# Patient Record
Sex: Female | Born: 1985 | Race: Black or African American | Hispanic: No | Marital: Married | Smoking: Never smoker
Health system: Southern US, Community
[De-identification: ages and names within clinical notes are randomized; demographics above are authoritative.]

## PROBLEM LIST (undated history)

## (undated) DIAGNOSIS — M545 Low back pain, unspecified: Secondary | ICD-10-CM

## (undated) DIAGNOSIS — F32A Depression, unspecified: Secondary | ICD-10-CM

## (undated) DIAGNOSIS — D509 Iron deficiency anemia, unspecified: Secondary | ICD-10-CM

## (undated) DIAGNOSIS — N920 Excessive and frequent menstruation with regular cycle: Secondary | ICD-10-CM

## (undated) DIAGNOSIS — I10 Essential (primary) hypertension: Secondary | ICD-10-CM

## (undated) DIAGNOSIS — F329 Major depressive disorder, single episode, unspecified: Secondary | ICD-10-CM

## (undated) DIAGNOSIS — R109 Unspecified abdominal pain: Secondary | ICD-10-CM

## (undated) DIAGNOSIS — B009 Herpesviral infection, unspecified: Secondary | ICD-10-CM

## (undated) HISTORY — DX: Essential (primary) hypertension: I10

## (undated) HISTORY — DX: Major depressive disorder, single episode, unspecified: F32.9

## (undated) HISTORY — PX: ROUX-EN-Y GASTRIC BYPASS: SHX1104

## (undated) HISTORY — PX: WISDOM TOOTH EXTRACTION: SHX21

## (undated) HISTORY — PX: DILATION AND CURETTAGE OF UTERUS: SHX78

## (undated) HISTORY — DX: Herpesviral infection, unspecified: B00.9

## (undated) HISTORY — DX: Depression, unspecified: F32.A

---
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
## 2008-08-04 DIAGNOSIS — E559 Vitamin D deficiency, unspecified: Secondary | ICD-10-CM | POA: Insufficient documentation

## 2011-11-08 DIAGNOSIS — Z6791 Unspecified blood type, Rh negative: Secondary | ICD-10-CM | POA: Insufficient documentation

## 2012-05-15 DIAGNOSIS — O10019 Pre-existing essential hypertension complicating pregnancy, unspecified trimester: Secondary | ICD-10-CM | POA: Insufficient documentation

## 2012-07-29 ENCOUNTER — Other Ambulatory Visit (HOSPITAL_COMMUNITY): Payer: Self-pay | Admitting: Obstetrics and Gynecology

## 2012-07-29 DIAGNOSIS — O169 Unspecified maternal hypertension, unspecified trimester: Secondary | ICD-10-CM

## 2012-09-02 ENCOUNTER — Encounter (HOSPITAL_COMMUNITY): Payer: Self-pay

## 2012-09-02 ENCOUNTER — Ambulatory Visit (HOSPITAL_COMMUNITY)
Admission: RE | Admit: 2012-09-02 | Discharge: 2012-09-02 | Disposition: A | Discharge status: Home / Self Care | Payer: Medicaid Other | Source: Ambulatory Visit | Attending: Obstetrics and Gynecology | Admitting: Obstetrics and Gynecology

## 2012-09-02 DIAGNOSIS — O3410 Maternal care for benign tumor of corpus uteri, unspecified trimester: Secondary | ICD-10-CM | POA: Insufficient documentation

## 2012-09-02 DIAGNOSIS — O169 Unspecified maternal hypertension, unspecified trimester: Secondary | ICD-10-CM

## 2012-09-02 DIAGNOSIS — O10019 Pre-existing essential hypertension complicating pregnancy, unspecified trimester: Secondary | ICD-10-CM | POA: Insufficient documentation

## 2012-09-29 ENCOUNTER — Ambulatory Visit (HOSPITAL_COMMUNITY)
Admission: RE | Admit: 2012-09-29 | Discharge: 2012-09-29 | Disposition: A | Discharge status: Home / Self Care | Payer: Self-pay | Source: Ambulatory Visit | Attending: Obstetrics and Gynecology | Admitting: Obstetrics and Gynecology

## 2012-09-29 VITALS — BP 135/83 | HR 86 | Wt 188.8 lb

## 2012-09-29 DIAGNOSIS — O10019 Pre-existing essential hypertension complicating pregnancy, unspecified trimester: Secondary | ICD-10-CM | POA: Insufficient documentation

## 2012-09-29 DIAGNOSIS — Z3689 Encounter for other specified antenatal screening: Secondary | ICD-10-CM | POA: Insufficient documentation

## 2012-09-29 DIAGNOSIS — O3410 Maternal care for benign tumor of corpus uteri, unspecified trimester: Secondary | ICD-10-CM | POA: Insufficient documentation

## 2012-09-29 DIAGNOSIS — O169 Unspecified maternal hypertension, unspecified trimester: Secondary | ICD-10-CM

## 2012-09-29 NOTE — Progress Notes (Signed)
Jenna Sims  was seen today for an ultrasound appointment.  See full report in [facility].  Impression: Single IUP at 29 2/7 weeks Follow up growth due to chronic hypertension on Labetolol Normal interval anatomy Fetal growth is appropriate (66th %tile) Normal amniotic fluid volume  Recommendations: Recommend follow-up ultrasound examination in 4 weeks for interval growth. Recommend antepartum fetal testing beginning at 32 weeks due to chronic hypertension.  Alpha Gula, MD

## 2012-10-27 ENCOUNTER — Encounter (HOSPITAL_COMMUNITY): Payer: Self-pay

## 2012-10-27 ENCOUNTER — Ambulatory Visit (HOSPITAL_COMMUNITY)
Admission: RE | Admit: 2012-10-27 | Discharge: 2012-10-27 | Disposition: A | Discharge status: Home / Self Care | Payer: BC Managed Care – PPO | Source: Ambulatory Visit | Attending: Obstetrics and Gynecology | Admitting: Obstetrics and Gynecology

## 2012-10-27 ENCOUNTER — Other Ambulatory Visit (HOSPITAL_COMMUNITY): Payer: Self-pay | Admitting: Maternal and Fetal Medicine

## 2012-10-27 DIAGNOSIS — O169 Unspecified maternal hypertension, unspecified trimester: Secondary | ICD-10-CM

## 2012-10-27 DIAGNOSIS — O10019 Pre-existing essential hypertension complicating pregnancy, unspecified trimester: Secondary | ICD-10-CM | POA: Insufficient documentation

## 2012-10-27 DIAGNOSIS — O3410 Maternal care for benign tumor of corpus uteri, unspecified trimester: Secondary | ICD-10-CM | POA: Insufficient documentation

## 2012-10-27 DIAGNOSIS — O360990 Maternal care for other rhesus isoimmunization, unspecified trimester, not applicable or unspecified: Secondary | ICD-10-CM | POA: Insufficient documentation

## 2012-11-04 ENCOUNTER — Ambulatory Visit (HOSPITAL_COMMUNITY)
Admission: RE | Admit: 2012-11-04 | Discharge: 2012-11-04 | Disposition: A | Discharge status: Home / Self Care | Payer: BC Managed Care – PPO | Source: Ambulatory Visit | Attending: Specialist | Admitting: Specialist

## 2012-11-04 ENCOUNTER — Other Ambulatory Visit (HOSPITAL_COMMUNITY): Payer: Self-pay | Admitting: Specialist

## 2012-11-04 DIAGNOSIS — O169 Unspecified maternal hypertension, unspecified trimester: Secondary | ICD-10-CM

## 2012-11-04 DIAGNOSIS — O10019 Pre-existing essential hypertension complicating pregnancy, unspecified trimester: Secondary | ICD-10-CM | POA: Insufficient documentation

## 2012-11-04 DIAGNOSIS — O3410 Maternal care for benign tumor of corpus uteri, unspecified trimester: Secondary | ICD-10-CM | POA: Insufficient documentation

## 2012-11-04 NOTE — ED Notes (Signed)
Spoke with Dr. Sherrie George about pt working night shift and having multiple appointments between [facility] and [facility].  Ok for pt to have weekly BPP with NST per Dr. Sherrie George.

## 2012-11-07 ENCOUNTER — Ambulatory Visit (HOSPITAL_COMMUNITY): Admission: RE | Admit: 2012-11-07 | Payer: BC Managed Care – PPO | Source: Ambulatory Visit

## 2012-11-07 ENCOUNTER — Ambulatory Visit (HOSPITAL_COMMUNITY)
Admission: RE | Admit: 2012-11-07 | Discharge: 2012-11-07 | Disposition: A | Discharge status: Home / Self Care | Payer: BC Managed Care – PPO | Source: Ambulatory Visit | Attending: Specialist | Admitting: Specialist

## 2012-11-07 ENCOUNTER — Other Ambulatory Visit (HOSPITAL_COMMUNITY): Payer: Self-pay | Admitting: Specialist

## 2012-11-07 DIAGNOSIS — O10019 Pre-existing essential hypertension complicating pregnancy, unspecified trimester: Secondary | ICD-10-CM | POA: Insufficient documentation

## 2012-11-07 DIAGNOSIS — O169 Unspecified maternal hypertension, unspecified trimester: Secondary | ICD-10-CM

## 2012-11-07 DIAGNOSIS — O3410 Maternal care for benign tumor of corpus uteri, unspecified trimester: Secondary | ICD-10-CM | POA: Insufficient documentation

## 2012-11-07 NOTE — Progress Notes (Signed)
Jenna Sims  was seen today for an ultrasound appointment.  See full report in [facility].  Impression: Single IUP at 34 6/7 weeks CHTN on Labetalol Active fetus- BPP 8/8 Normal amniotic fluid index  Recommendations: Continue twice weekly NSTs with weekly AFIs   Deliver by 39 weeks  Alpha Gula, MD

## 2012-11-11 ENCOUNTER — Ambulatory Visit (HOSPITAL_COMMUNITY): Payer: BC Managed Care – PPO

## 2012-11-13 ENCOUNTER — Other Ambulatory Visit (HOSPITAL_COMMUNITY): Payer: Self-pay | Admitting: Specialist

## 2012-11-13 DIAGNOSIS — O169 Unspecified maternal hypertension, unspecified trimester: Secondary | ICD-10-CM

## 2012-11-14 ENCOUNTER — Ambulatory Visit (HOSPITAL_COMMUNITY): Payer: BC Managed Care – PPO

## 2012-11-14 ENCOUNTER — Ambulatory Visit (HOSPITAL_COMMUNITY)
Admission: RE | Admit: 2012-11-14 | Discharge: 2012-11-14 | Disposition: A | Discharge status: Home / Self Care | Payer: BC Managed Care – PPO | Source: Ambulatory Visit | Attending: Specialist | Admitting: Specialist

## 2012-11-14 DIAGNOSIS — O169 Unspecified maternal hypertension, unspecified trimester: Secondary | ICD-10-CM

## 2012-11-14 DIAGNOSIS — O10019 Pre-existing essential hypertension complicating pregnancy, unspecified trimester: Secondary | ICD-10-CM | POA: Insufficient documentation

## 2012-11-14 DIAGNOSIS — O3410 Maternal care for benign tumor of corpus uteri, unspecified trimester: Secondary | ICD-10-CM | POA: Insufficient documentation

## 2012-11-18 ENCOUNTER — Ambulatory Visit (HOSPITAL_COMMUNITY): Payer: BC Managed Care – PPO

## 2012-11-20 ENCOUNTER — Other Ambulatory Visit (HOSPITAL_COMMUNITY): Payer: Self-pay | Admitting: Maternal and Fetal Medicine

## 2012-11-20 DIAGNOSIS — O163 Unspecified maternal hypertension, third trimester: Secondary | ICD-10-CM

## 2012-11-21 ENCOUNTER — Ambulatory Visit (HOSPITAL_COMMUNITY): Payer: BC Managed Care – PPO

## 2012-11-21 ENCOUNTER — Ambulatory Visit (HOSPITAL_COMMUNITY)
Admission: RE | Admit: 2012-11-21 | Discharge: 2012-11-21 | Disposition: A | Discharge status: Home / Self Care | Payer: BC Managed Care – PPO | Source: Ambulatory Visit | Attending: Maternal and Fetal Medicine | Admitting: Maternal and Fetal Medicine

## 2012-11-21 DIAGNOSIS — O3410 Maternal care for benign tumor of corpus uteri, unspecified trimester: Secondary | ICD-10-CM | POA: Insufficient documentation

## 2012-11-21 DIAGNOSIS — O163 Unspecified maternal hypertension, third trimester: Secondary | ICD-10-CM

## 2012-11-21 DIAGNOSIS — O10019 Pre-existing essential hypertension complicating pregnancy, unspecified trimester: Secondary | ICD-10-CM | POA: Insufficient documentation

## 2012-11-25 ENCOUNTER — Ambulatory Visit (HOSPITAL_COMMUNITY): Payer: Medicaid Other

## 2012-11-25 ENCOUNTER — Other Ambulatory Visit (HOSPITAL_COMMUNITY): Payer: Self-pay | Admitting: Specialist

## 2012-11-25 DIAGNOSIS — O10019 Pre-existing essential hypertension complicating pregnancy, unspecified trimester: Secondary | ICD-10-CM

## 2012-11-27 ENCOUNTER — Ambulatory Visit (HOSPITAL_COMMUNITY)
Admission: RE | Admit: 2012-11-27 | Discharge: 2012-11-27 | Disposition: A | Discharge status: Home / Self Care | Payer: BC Managed Care – PPO | Source: Ambulatory Visit | Attending: Specialist | Admitting: Specialist

## 2012-11-27 DIAGNOSIS — O10019 Pre-existing essential hypertension complicating pregnancy, unspecified trimester: Secondary | ICD-10-CM | POA: Insufficient documentation

## 2012-11-27 DIAGNOSIS — O3410 Maternal care for benign tumor of corpus uteri, unspecified trimester: Secondary | ICD-10-CM | POA: Insufficient documentation

## 2012-12-02 ENCOUNTER — Other Ambulatory Visit (HOSPITAL_COMMUNITY): Payer: Self-pay | Admitting: Specialist

## 2012-12-02 DIAGNOSIS — O10013 Pre-existing essential hypertension complicating pregnancy, third trimester: Secondary | ICD-10-CM

## 2012-12-05 ENCOUNTER — Ambulatory Visit (HOSPITAL_COMMUNITY): Admission: RE | Admit: 2012-12-05 | Payer: BC Managed Care – PPO | Source: Ambulatory Visit

## 2012-12-08 DIAGNOSIS — O139 Gestational [pregnancy-induced] hypertension without significant proteinuria, unspecified trimester: Secondary | ICD-10-CM

## 2013-07-08 ENCOUNTER — Encounter (HOSPITAL_COMMUNITY): Payer: Self-pay | Admitting: *Deleted

## 2014-02-08 ENCOUNTER — Encounter (HOSPITAL_COMMUNITY): Payer: Self-pay | Admitting: *Deleted

## 2016-11-09 DIAGNOSIS — I10 Essential (primary) hypertension: Secondary | ICD-10-CM | POA: Insufficient documentation

## 2017-05-14 ENCOUNTER — Ambulatory Visit
Admission: RE | Admit: 2017-05-14 | Discharge: 2017-05-14 | Disposition: A | Discharge status: Home / Self Care | Payer: Federal, State, Local not specified - PPO | Source: Ambulatory Visit | Attending: Medical Oncology | Admitting: Medical Oncology

## 2017-05-14 ENCOUNTER — Other Ambulatory Visit: Payer: Self-pay | Admitting: Medical Oncology

## 2017-05-14 DIAGNOSIS — R05 Cough: Secondary | ICD-10-CM

## 2017-05-14 DIAGNOSIS — R053 Chronic cough: Secondary | ICD-10-CM

## 2018-01-08 ENCOUNTER — Encounter: Payer: Self-pay | Admitting: Obstetrics and Gynecology

## 2018-01-08 ENCOUNTER — Ambulatory Visit (INDEPENDENT_AMBULATORY_CARE_PROVIDER_SITE_OTHER): Payer: Federal, State, Local not specified - PPO | Admitting: Obstetrics and Gynecology

## 2018-01-08 ENCOUNTER — Other Ambulatory Visit (HOSPITAL_COMMUNITY)
Admission: RE | Admit: 2018-01-08 | Discharge: 2018-01-08 | Disposition: A | Discharge status: Home / Self Care | Payer: Federal, State, Local not specified - PPO | Source: Ambulatory Visit | Attending: Obstetrics and Gynecology | Admitting: Obstetrics and Gynecology

## 2018-01-08 VITALS — BP 124/74 | Ht 62.0 in | Wt 212.0 lb

## 2018-01-08 DIAGNOSIS — Z1339 Encounter for screening examination for other mental health and behavioral disorders: Secondary | ICD-10-CM | POA: Diagnosis not present

## 2018-01-08 DIAGNOSIS — Z01419 Encounter for gynecological examination (general) (routine) without abnormal findings: Secondary | ICD-10-CM

## 2018-01-08 DIAGNOSIS — N921 Excessive and frequent menstruation with irregular cycle: Secondary | ICD-10-CM | POA: Insufficient documentation

## 2018-01-08 DIAGNOSIS — Z1331 Encounter for screening for depression: Secondary | ICD-10-CM

## 2018-01-08 DIAGNOSIS — Z113 Encounter for screening for infections with a predominantly sexual mode of transmission: Secondary | ICD-10-CM

## 2018-01-08 DIAGNOSIS — Z124 Encounter for screening for malignant neoplasm of cervix: Secondary | ICD-10-CM

## 2018-01-08 LAB — HM PAP SMEAR: HM Pap smear: NORMAL

## 2018-01-08 NOTE — Progress Notes (Signed)
Gynecology Annual Exam  PCP: Clent Jacks, PA-C  Chief Complaint  Patient presents with  . Annual Exam    History of Present Illness:  [name] is a 32 y.o. [id number] who LMP was Patient's last menstrual period was 01/06/2018., presents today for her annual examination.  Her menses come twice monthly.  Sometimes the interval is shorter.  Her menses lasts 4-5 days when present. They are heavy with passage of clots, especially with the first two days. She does have dysmenorrhea.  The cramping bothers her the most.   She is single partner, contraception - none. "Unofficially" trying to get pregnant. She stopped using Xulane about 1 year ago.  Last Pap:  3+ years, normal.  History of abnormal 8-9 years ago.  "Nothing concerning." Hx of STDs: HSV  Last mammogram:  n/a There is no FH of breast cancer. There is no FH of ovarian cancer. The patient does do self-breast exams.  Tobacco use: The patient denies current or previous tobacco use. Alcohol use: social drinker Exercise: does a lot of walking.   The patient wears seatbelts: yes.     She states that she feels safe at home.   She is being treated by Clent Jacks, NP at [facility] in [place].  She recently started taking Prozac.  She scored a "2" on number 9 on the PHQ-9.  She has fleeting thoughts that she would prefer not to be here. She has never formed a plan.  She is able to contract for safety.    Past Medical History:  Diagnosis Date  . Depression   . HSV-1 infection   . Hypertension     Past Surgical History:  Procedure Laterality Date  . DILATION AND CURETTAGE OF UTERUS    . WISDOM TOOTH EXTRACTION      Prior to Admission medications   Medication Sig Start Date End Date Taking? Authorizing Provider  FLUoxetine (PROZAC) 10 MG capsule Take by mouth. 12/26/17 12/26/18 Yes [provider]  hydrochlorothiazide (HYDRODIURIL) 12.5 MG tablet Take by mouth. 02/15/17 02/15/18 Yes [provider]  lisinopril (PRINIVIL,ZESTRIL) 20 MG tablet Take by mouth. 01/16/17 01/16/18 Yes [provider]  Prenatal Vit w/Fe-Methylfol-FA (PNV PO) Take by mouth.    [provider]   Allergies: No Known Allergies  Gynecologic History: Patient's last menstrual period was 01/06/2018.  Obstetric History: [id number], s/p SVD (2008, 2014 - HTN issues of pregnancy, but has had high blood pressure since then).    Social History   Socioeconomic History  . Marital status: Single    Spouse name: Not on file  . Number of children: Not on file  . Years of education: Not on file  . Highest education level: Not on file  Occupational History  . Not on file  Social Needs  . Financial resource strain: Not on file  . Food insecurity:    Worry: Not on file    Inability: Not on file  . Transportation needs:    Medical: Not on file    Non-medical: Not on file  Tobacco Use  . Smoking status: Never Smoker  . Smokeless tobacco: Never Used  Substance and Sexual Activity  . Alcohol use: Not Currently  . Drug use: Never  . Sexual activity: Yes    Birth control/protection: None  Lifestyle  . Physical activity:    Days per week: Not on file    Minutes per session: Not on file  . Stress: Not on file  Relationships  . Social connections:    Talks on phone: Not on file    Gets together: Not on file    Attends religious service: Not on file    Active member of club or organization: Not on file    Attends meetings of clubs or organizations: Not on file    Relationship status: Not on file  . Intimate partner violence:    Fear of current or ex partner: Not on file    Emotionally abused: Not on file    Physically abused: Not on file    Forced sexual activity: Not on file  Other Topics Concern  . Not on file  Social History Narrative  . Not on file    Family History  Problem Relation Age of Onset  . Hypertension Mother   . Hypertension Father     Review of Systems   Constitutional: Negative.   HENT: Negative.   Eyes: Negative.   Respiratory: Negative.   Cardiovascular: Negative.   Gastrointestinal: Negative.   Genitourinary: Negative.   Musculoskeletal: Negative.   Skin: Negative.   Neurological: Negative.   Psychiatric/Behavioral: Negative.      Physical Exam BP 124/74   Ht 5\' 2"  (1.575 m)   Wt 212 lb (96.2 kg)   LMP 01/06/2018   BMI 38.78 kg/m    Physical Exam  Constitutional: She is oriented to person, place, and time. She appears well-developed and well-nourished. No distress.  Genitourinary: Uterus normal. Pelvic exam was performed with patient supine. There is no rash, tenderness, lesion or injury on the right labia. There is no rash, tenderness, lesion or injury on the left labia. No erythema, tenderness or bleeding in the vagina. No signs of injury around the vagina. No vaginal discharge found. Right adnexum does not display mass, does not display tenderness and does not display fullness. Left adnexum does not display mass, does not display tenderness and does not display fullness. Cervix does not exhibit motion tenderness, lesion, discharge or polyp.   Uterus is mobile and anteverted. Uterus is not enlarged, tender or exhibiting a mass.  HENT:  Head: Normocephalic and atraumatic.  Eyes: EOM are normal. No scleral icterus.  Neck: Normal range of motion. Neck supple. No thyromegaly present.  Cardiovascular: Normal rate and regular rhythm. Exam reveals no gallop and no friction rub.  No murmur heard. Pulmonary/Chest: Effort normal and breath sounds normal. No respiratory distress. She has no wheezes. She has no rales. Right breast exhibits no inverted nipple, no mass, no nipple discharge, no skin change and no tenderness. Left breast exhibits no inverted nipple, no mass, no nipple discharge, no skin change and no tenderness.  Abdominal: Soft. Bowel sounds are normal. She exhibits no distension and no mass. There is no tenderness. There  is no rebound and no guarding.  Musculoskeletal: Normal range of motion. She exhibits no edema or tenderness.  Lymphadenopathy:    She has no cervical adenopathy.       Right: No inguinal adenopathy present.       Left: No inguinal adenopathy present.  Neurological: She is alert and oriented to person, place, and time. No cranial nerve deficit.  Skin: Skin is warm and dry. No rash noted. No erythema.  Psychiatric: She has a normal mood and affect. Her behavior is normal. Judgment normal.    Female chaperone present for pelvic and breast  portions of the physical exam  Results: AUDIT Questionnaire (screen for alcoholism): 2 PHQ-9: 13  Assessment: 32 y.o. [id number] female  here for routine annual gynecologic examination.  Plan: Problem List Items Addressed This Visit      Other   Menorrhagia with irregular cycle   Relevant Orders   US PELVIS TRANSVANGINAL NON-OB (TV ONLY)    Other Visit Diagnoses    Women's annual routine gynecological examination    -  Primary   Relevant Orders   Cytology - PAP   Screening for depression       Screening for alcoholism       Pap smear for cervical cancer screening       Relevant Orders   Cytology - PAP   Screen for STD (sexually transmitted disease)       Relevant Orders   Cytology - PAP      Screening: -- Blood pressure screen managed by PCP -- Colonoscopy - not due -- Mammogram - not due -- Weight screening: obese: discussed management options, including lifestyle, dietary, and exercise. -- Depression screening positive(PHQ-9): Currently under treatment with PCP.  -- Nutrition: normal -- cholesterol screening: not due for screening -- osteoporosis screening: not due -- tobacco screening: not using -- alcohol screening: AUDIT questionnaire indicates low-risk usage. -- family history of breast cancer screening: done. not at high risk. -- no evidence of domestic violence or intimate partner violence. -- STD screening:  gonorrhea/chlamydia NAAT collected -- pap smear collected per ASCCP guidelines -- flu vaccine plan to get at work -- HPV vaccination series: received  Depression: per PCP.  Will defer treatment. Patient recently started treated. She is able to contract for safety now. Reviewed emergency plan for active SI.   Preconception counseling: Discussed optimizing health for pregnancy, as she is not actively trying but could get pregnant.  Recommended she discuss with her PCP a change in her blood pressure medication to be more consistent with safe medications in pregnancy.  Specifically, I would discontinue the ACE inhibitor and consider discontinuing HCTZ.  Labetalol has been shown to be a safe and effective antihypertensive in pregnancy and would recommend it as a first-line medication.  I also encouraged the use of prenatal vitamins at this time.  Also, we discussed weight loss as a way of optimizing any future pregnancy.  Any of these recommendations would be notified should the patient choose to begin taking contraception.  We did discuss the use of Prozac in pregnancy.  Given her high level of depression, it would be safer for her to be on the medication during her pregnancy then to be off the medication.  Therefore, I recommend she continue taking it.  Menorrhagia: Discussed management options for abnormal uterine bleeding including expectant, NSAIDs, tranexamic acid (Lysteda), oral progesterone (Provera, norethindrone, megace), Depo Provera, Levonorgestrel containing IUD, endometrial ablation (Novasure) or hysterectomy as definitive surgical management.  Discussed risks and benefits of each method.   Final management decision will hinge on results of patient's work up and whether an underlying etiology for the patients bleeding symptoms can be discerned.  We will conduct a basic work up examining using the PALM-COIEN classification system.  In the meantime the patient opts to trial no medication while we  await results of her ultrasound and labs.  Printed patient education handouts were given to the patient to review at home.  Bleeding precautions reviewed.   15 minutes spent in face to face discussion with > 50% spent in counseling,management, and coordination of care of her menorrhagia with irregular cycle.   Thomasene Mohair, MD 01/08/2018 11:50 AM

## 2018-01-09 LAB — CYTOLOGY - PAP
CHLAMYDIA, DNA PROBE: NEGATIVE
DIAGNOSIS: NEGATIVE
NEISSERIA GONORRHEA: NEGATIVE

## 2018-01-17 ENCOUNTER — Encounter: Payer: Self-pay | Admitting: Obstetrics and Gynecology

## 2018-01-17 ENCOUNTER — Ambulatory Visit (INDEPENDENT_AMBULATORY_CARE_PROVIDER_SITE_OTHER): Payer: Federal, State, Local not specified - PPO

## 2018-01-17 ENCOUNTER — Ambulatory Visit (INDEPENDENT_AMBULATORY_CARE_PROVIDER_SITE_OTHER): Payer: Federal, State, Local not specified - PPO | Admitting: Obstetrics and Gynecology

## 2018-01-17 VITALS — BP 124/78 | Ht 62.0 in | Wt 212.0 lb

## 2018-01-17 DIAGNOSIS — N921 Excessive and frequent menstruation with irregular cycle: Secondary | ICD-10-CM

## 2018-01-17 DIAGNOSIS — N83291 Other ovarian cyst, right side: Secondary | ICD-10-CM

## 2018-01-17 DIAGNOSIS — N8003 Adenomyosis of the uterus: Secondary | ICD-10-CM | POA: Insufficient documentation

## 2018-01-17 DIAGNOSIS — N809 Endometriosis, unspecified: Secondary | ICD-10-CM

## 2018-01-17 NOTE — Progress Notes (Signed)
Gynecology Ultrasound Follow Up   Chief Complaint  Patient presents with  . Follow-up  Menorrhagia with irregular cycle  History of Present Illness: Patient is a 32 y.o. female who presents today for ultrasound evaluation of the above .  Ultrasound demonstrates the following findings Adnexa: no masses seen  Uterus: antevereted with endometrial stripe  12.7 mm Additional: echotexture of mymometrium is heterogeneous.  Hyperechoic area in the posterior uterine wall measures 3.4 x 1.9 cm.   Past Medical History:  Diagnosis Date  . Depression   . HSV-1 infection   . Hypertension     Past Surgical History:  Procedure Laterality Date  . DILATION AND CURETTAGE OF UTERUS    . WISDOM TOOTH EXTRACTION       Family History  Problem Relation Age of Onset  . Hypertension Mother   . Hypertension Father     Social History   Socioeconomic History  . Marital status: Married    Spouse name: Not on file  . Number of children: Not on file  . Years of education: Not on file  . Highest education level: Not on file  Occupational History  . Not on file  Social Needs  . Financial resource strain: Not on file  . Food insecurity:    Worry: Not on file    Inability: Not on file  . Transportation needs:    Medical: Not on file    Non-medical: Not on file  Tobacco Use  . Smoking status: Never Smoker  . Smokeless tobacco: Never Used  Substance and Sexual Activity  . Alcohol use: Not Currently  . Drug use: Never  . Sexual activity: Yes    Birth control/protection: None  Lifestyle  . Physical activity:    Days per week: Not on file    Minutes per session: Not on file  . Stress: Not on file  Relationships  . Social connections:    Talks on phone: Not on file    Gets together: Not on file    Attends religious service: Not on file    Active member of club or organization: Not on file    Attends meetings of clubs or organizations: Not on file    Relationship status: Not on file   . Intimate partner violence:    Fear of current or ex partner: Not on file    Emotionally abused: Not on file    Physically abused: Not on file    Forced sexual activity: Not on file  Other Topics Concern  . Not on file  Social History Narrative  . Not on file    No Known Allergies  Prior to Admission medications   Medication Sig Start Date End Date Taking? Authorizing Provider  FLUoxetine (PROZAC) 10 MG capsule Take by mouth. 12/26/17 12/26/18  [provider]  hydrochlorothiazide (HYDRODIURIL) 12.5 MG tablet Take by mouth. 02/15/17 02/15/18  [provider]  lisinopril (PRINIVIL,ZESTRIL) 20 MG tablet Take by mouth. 01/16/17 01/16/18  [provider]  Prenatal Vit w/Fe-Methylfol-FA (PNV PO) Take by mouth.    [provider]    Physical Exam BP 124/78   Ht 5\' 2"  (1.575 m)   Wt 212 lb (96.2 kg)   LMP 01/06/2018   BMI 38.78 kg/m    General: NAD HEENT: normocephalic, anicteric Pulmonary: No increased work of breathing Extremities: no edema, erythema, or tenderness Neurologic: Grossly intact, normal gait Psychiatric: mood appropriate, affect full  Imaging Results:  US Pelvis Transvanginal Non-ob (tv Only)  Result Date: 01/17/2018 Patient Name: BENNETT VANSCYOC DOB: 06/03/1985 MRN: [id number] ULTRASOUND REPORT Location: [facility] Date of Service: 01/17/2018 Indications: Abnormal Uterine Bleeding Findings: The uterus is anteverted and measures 9.6 x 8.1 x 6.5 cm. Echo texture is heterogenous with evidence of focal masses. A hyperechoic area in the posterior uterine wall = 3.4 x 1.9 cm which has two small echo free cysts both = 0.5 x 0.3 cm , ( Possibility of adenomyosis)  The Endometrium measures 12.7 mm. Right Ovary measures 4.3 x 3.3 cm. It is normal in appearance. A single simple cyst is seen  = 19.8 x 18.4 mm (domonant follicle vs Corpus luteum) Left Ovary measures 3.4 x 3.2 x 2.5 cm. It is normal in appearance. Survey of the adnexa  demonstrates no adnexal masses. There is no free fluid in the cul de sac. Impression: 1.Echotexture is heterogenous with evidence of focal masses. A hyperechoic area in the posterior uterine wall = 3.4 x 1.9 cm which has two small echo free cysts both = 0.5 x 0.3 cm , ( Possibility of adenomyosis)  Abeer Alsammarraie,RDMS The ultrasound images and findings were reviewed by me and I agree with the above report. Thomasene Mohair, MD, [facility], [facility] 01/17/2018 11:49 AM       Assessment: 32 y.o. [id number]  1. Menorrhagia with irregular cycle      Plan: Problem List Items Addressed This Visit      Other   Menorrhagia with irregular cycle - Primary     Discussed ultrasound results in detail.  I suspect she has either an adenomyoma or mild adenomyosis.  We discussed this in the setting of attempting pregnancy in her young age.  She continues to want to passively attempt pregnancy.  I again encouraged her to optimize her overall health and optimize her medication regiment for pregnancy, she could get pregnant with any cycle.  Further, we discussed active attempts at pregnancy with utilization of ovulation prediction kits and timing of her cycle.  If her cycle is too irregular, can try to manage the timing of her cycle using scheduled withdrawal bleeds and ovulation testing.  Medications acceptable in pregnancy are labetalol, nifedipine, and methyldopa.  15 minutes spent in face to face discussion with > 50% spent in counseling,management, and coordination of care of her menorrhagia and likely adenomyosis.  Thomasene Mohair, MD, [facility], [facility] 01/17/2018 1:04 PM

## 2018-05-14 ENCOUNTER — Encounter: Payer: Self-pay | Admitting: Obstetrics and Gynecology

## 2018-05-14 ENCOUNTER — Ambulatory Visit: Payer: Federal, State, Local not specified - PPO | Admitting: Obstetrics and Gynecology

## 2018-05-14 VITALS — BP 118/74 | Ht 61.0 in | Wt 215.0 lb

## 2018-05-14 DIAGNOSIS — N8003 Adenomyosis of the uterus: Secondary | ICD-10-CM

## 2018-05-14 DIAGNOSIS — N809 Endometriosis, unspecified: Secondary | ICD-10-CM

## 2018-05-14 DIAGNOSIS — G8929 Other chronic pain: Secondary | ICD-10-CM

## 2018-05-14 DIAGNOSIS — R102 Pelvic and perineal pain: Secondary | ICD-10-CM | POA: Diagnosis not present

## 2018-05-14 NOTE — Progress Notes (Signed)
Obstetrics & Gynecology Office Visit   Chief Complaint  Patient presents with  . Advice Only  . Pelvic Pain    History of Present Illness: 33 y.o. [id number] female who presents to discuss options for checking for endometriosis.  She has been doing some internet reading that, to her, indicates that she may have endometriosis.  She states that while she has her menses she has trouble with passing a bowel movement.  She has pressure in her pelvis and a general pain during her menses.  She describes the pain as intense.  So much so that she had to miss work due to the pain.     Past Medical History:  Diagnosis Date  . Depression   . HSV-1 infection   . Hypertension     Past Surgical History:  Procedure Laterality Date  . DILATION AND CURETTAGE OF UTERUS    . WISDOM TOOTH EXTRACTION      Gynecologic History: Patient's last menstrual period was 04/23/2018.  Obstetric History: [id number]  Family History  Problem Relation Age of Onset  . Hypertension Mother   . Hypertension Father     Social History   Socioeconomic History  . Marital status: Married    Spouse name: Not on file  . Number of children: Not on file  . Years of education: Not on file  . Highest education level: Not on file  Occupational History  . Not on file  Social Needs  . Financial resource strain: Not on file  . Food insecurity:    Worry: Not on file    Inability: Not on file  . Transportation needs:    Medical: Not on file    Non-medical: Not on file  Tobacco Use  . Smoking status: Never Smoker  . Smokeless tobacco: Never Used  Substance and Sexual Activity  . Alcohol use: Not Currently  . Drug use: Never  . Sexual activity: Yes    Birth control/protection: None  Lifestyle  . Physical activity:    Days per week: Not on file    Minutes per session: Not on file  . Stress: Not on file  Relationships  . Social connections:    Talks on phone: Not on file    Gets together: Not on file    Attends  religious service: Not on file    Active member of club or organization: Not on file    Attends meetings of clubs or organizations: Not on file    Relationship status: Not on file  . Intimate partner violence:    Fear of current or ex partner: Not on file    Emotionally abused: Not on file    Physically abused: Not on file    Forced sexual activity: Not on file  Other Topics Concern  . Not on file  Social History Narrative  . Not on file    No Known Allergies  Prior to Admission medications   Medication Sig Start Date End Date Taking? Authorizing Provider  FLUoxetine (PROZAC) 10 MG capsule Take by mouth. 12/26/17 12/26/18  [provider]  hydrochlorothiazide (HYDRODIURIL) 12.5 MG tablet Take by mouth. 02/15/17 02/15/18  [provider]  lisinopril (PRINIVIL,ZESTRIL) 20 MG tablet Take by mouth. 01/16/17 01/16/18  [provider]  Prenatal Vit w/Fe-Methylfol-FA (PNV PO) Take by mouth.    [provider]    Review of Systems  Constitutional: Negative.   HENT: Negative.   Eyes: Negative.   Respiratory: Negative.   Cardiovascular: Negative.  Gastrointestinal: Negative.   Genitourinary: Negative.   Musculoskeletal: Negative.   Skin: Negative.   Neurological: Negative.   Psychiatric/Behavioral: Negative.      Physical Exam BP 118/74   Ht 5\' 1"  (1.549 m)   Wt 215 lb (97.5 kg)   LMP 04/23/2018   BMI 40.62 kg/m  Patient's last menstrual period was 04/23/2018. Physical Exam Constitutional:      General: She is not in acute distress.    Appearance: Normal appearance.  HENT:     Head: Normocephalic and atraumatic.  Eyes:     General: No scleral icterus.    Conjunctiva/sclera: Conjunctivae normal.  Neurological:     General: No focal deficit present.     Mental Status: She is alert and oriented to person, place, and time.     Cranial Nerves: No cranial nerve deficit.  Psychiatric:        Mood and Affect: Mood normal.        Behavior:  Behavior normal.        Judgment: Judgment normal.     Female chaperone present for pelvic and breast  portions of the physical exam  Assessment: 33 y.o. [id number] female here for  1. Chronic pelvic pain in female   2. Adenomyosis      Plan: Problem List Items Addressed This Visit      Other   Adenomyosis    Other Visit Diagnoses    Chronic pelvic pain in female    -  Primary     Discussed that she would really like confirmation of her source of pain. We discussed the negatives and positives of the "gold standard" for diagnosis of endometriosis, which is diagnostic laparoscopy.  Discussed that would could just treat with hormonal medication. However, after much discussion, she would like the most information possible.  We discussed that we don't always find anything on diagnostic ultrasound and that the surgery may still not give her an answer. She voiced understanding and agreement to proceed.  20 minutes spent in face to face discussion with > 50% spent in counseling,management, and coordination of care of her chronic pelvic pain.   Thomasene Mohair, MD 05/14/2018 1:04 PM

## 2018-05-15 ENCOUNTER — Telehealth: Payer: Self-pay | Admitting: Obstetrics and Gynecology

## 2018-05-15 NOTE — Telephone Encounter (Signed)
-----   Message from Conard Novak, MD sent at 05/14/2018  1:01 PM EST ----- Regarding: Schedule surgery Surgery Booking Request Patient Full Name:  Jenna Sims  MRN: [id number]  DOB: Mar 16, 1986  Surgeon: Thomasene Mohair, MD  Requested Surgery Date and Time: TBD Primary Diagnosis AND Code: chronic pelvic pain Secondary Diagnosis and Code:  Surgical Procedure: diagnostic laparoscopy L&D Notification: No Admission Status: same day surgery Length of Surgery: 45 minutes Special Case Needs: none H&P: TBD (date) Phone Interview???: no Interpreter: Language:  Medical Clearance: no Special Scheduling Instructions: none

## 2018-05-15 NOTE — Telephone Encounter (Signed)
-----   Message from Stephen D Jackson, MD sent at 05/14/2018  1:01 PM EST ----- °Regarding: Schedule surgery °Surgery Booking Request °Patient Full Name:  Jenna Sims  °MRN: [id number]  °DOB: 02/09/1986  °Surgeon: Stephen Jackson, MD  °Requested Surgery Date and Time: TBD °Primary Diagnosis AND Code: chronic pelvic pain °Secondary Diagnosis and Code:  °Surgical Procedure: diagnostic laparoscopy °L&D Notification: No °Admission Status: same day surgery °Length of Surgery: 45 minutes °Special Case Needs: none °H&P: TBD (date) °Phone Interview???: no °Interpreter: °Language:  °Medical Clearance: no °Special Scheduling Instructions: none °  ° °

## 2018-05-15 NOTE — Telephone Encounter (Signed)
Lmtrc

## 2018-05-15 NOTE — Telephone Encounter (Signed)
Patient is aware of H&P at [facility] on Friday, 07/25/18 @ 8:10a.m. w/ Dr. Jean Rosenthal, Pre-admit Testing afterwards, and OR on 08/05/18. Patient is aware she may receive calls from the [facility] and [facility]. Patient confirmed BCBS, and no secondary insurance. Patient was given the phone# to Same Day Surgery and is aware she should call the day before surgery between 1-3 p.m. for an arrival time. Patient is aware she should have a friend or family member drive her on the day of surgery.

## 2018-07-09 ENCOUNTER — Telehealth: Payer: Self-pay | Admitting: Obstetrics and Gynecology

## 2018-07-09 NOTE — Telephone Encounter (Signed)
Patient is aware her 08/05/18 surgery is cancelled, and we will reschedule once restrictions are lifted. Patient is aware she can contact Dr. Jean Rosenthal through MyChart or schedule a telephone visit if needed.

## 2018-07-25 ENCOUNTER — Other Ambulatory Visit: Payer: Federal, State, Local not specified - PPO

## 2018-07-25 ENCOUNTER — Encounter: Payer: Federal, State, Local not specified - PPO | Admitting: Obstetrics and Gynecology

## 2018-08-05 ENCOUNTER — Ambulatory Visit: Admit: 2018-08-05 | Payer: Federal, State, Local not specified - PPO | Admitting: Obstetrics and Gynecology

## 2018-08-05 SURGERY — LAPAROSCOPY, DIAGNOSTIC
Anesthesia: Choice

## 2018-08-22 ENCOUNTER — Telehealth: Payer: Self-pay | Admitting: Obstetrics and Gynecology

## 2018-08-22 NOTE — Telephone Encounter (Signed)
Attempted to reach the patient to reschedule surgery. Lmtrc. °

## 2018-09-08 NOTE — Telephone Encounter (Signed)
Patient is aware of H7/10/20 @ 8:10am w/ Dr. Jean Rosenthal, Pre-admit Testing and COVID testing to be scheduled, and OR on 10/23/18. Patient is aware she will be asked to quarantine after COVID testing. [employer] insurance info was discussed. Patient is aware of phone check-in/ mask/ no visitors for [place].

## 2018-10-15 ENCOUNTER — Telehealth: Payer: Self-pay | Admitting: Obstetrics and Gynecology

## 2018-10-15 NOTE — Telephone Encounter (Signed)
Patient rescheduled surgery for 11/18/18, due to missing time from work for son. Patient is aware of H&P on 11/10/18 @ 8:10am w/ Dr. Glennon Mac, and Pre-admit Testing and COVID to be rescheduled.

## 2018-10-15 NOTE — Telephone Encounter (Signed)
Just a reminder.  Don't overly book me with surgery as that is my anniversary... thanks!

## 2018-10-17 ENCOUNTER — Encounter: Payer: Federal, State, Local not specified - PPO | Admitting: Obstetrics and Gynecology

## 2018-10-20 ENCOUNTER — Other Ambulatory Visit: Payer: Federal, State, Local not specified - PPO

## 2018-11-10 ENCOUNTER — Encounter: Payer: Federal, State, Local not specified - PPO | Admitting: Obstetrics and Gynecology

## 2018-11-11 ENCOUNTER — Other Ambulatory Visit: Payer: Self-pay

## 2018-11-11 ENCOUNTER — Ambulatory Visit (INDEPENDENT_AMBULATORY_CARE_PROVIDER_SITE_OTHER): Payer: Federal, State, Local not specified - PPO | Admitting: Obstetrics and Gynecology

## 2018-11-11 ENCOUNTER — Encounter: Payer: Self-pay | Admitting: Obstetrics and Gynecology

## 2018-11-11 VITALS — BP 122/74 | Ht 62.0 in | Wt 220.0 lb

## 2018-11-11 DIAGNOSIS — G8929 Other chronic pain: Secondary | ICD-10-CM | POA: Diagnosis not present

## 2018-11-11 DIAGNOSIS — R102 Pelvic and perineal pain: Secondary | ICD-10-CM | POA: Diagnosis not present

## 2018-11-11 NOTE — Progress Notes (Signed)
Preoperative History and Physical  Jenna CarrowStephanie A Sims is a 33 y.o. [id number] here for surgical management of pelvic pain.   No significant preoperative concerns.  History of Present Illness: 33 y.o. 263P2012 female who presents to discuss options for checking for endometriosis.  She has been doing some internet reading that, to her, indicates that she may have endometriosis.  She states that while she has her menses she has trouble with passing a bowel movement.  She has pressure in her pelvis and a general pain during her menses.  She describes the pain as intense.  So much so that she had to miss work due to the pain.  She continues to have the pain.   Proposed surgery: Diagnostic Laparoscopy  Past Medical History:  Diagnosis Date  . Depression   . HSV-1 infection   . Hypertension    Past Surgical History:  Procedure Laterality Date  . DILATION AND CURETTAGE OF UTERUS    . WISDOM TOOTH EXTRACTION     OB History  Gravida Para Term Preterm AB Living  3 2 2  0 1 2  SAB TAB Ectopic Multiple Live Births  1 0 0 0      # Outcome Date GA Lbr Len/2nd Weight Sex Delivery Anes PTL Lv  3 Term           2 SAB           1 Term           Patient denies any other pertinent gynecologic issues.   Current Outpatient Medications on File Prior to Visit  Medication Sig Dispense Refill  . hydrochlorothiazide (HYDRODIURIL) 12.5 MG tablet Take 12.5 mg by mouth daily.     Marland Kitchen. lisinopril (ZESTRIL) 40 MG tablet Take 40 mg by mouth daily.      No current facility-administered medications on file prior to visit.    No Known Allergies  Social History:   reports that she has never smoked. She has never used smokeless tobacco. She reports previous alcohol use. She reports that she does not use drugs.  Family History  Problem Relation Age of Onset  . Hypertension Mother   . Hypertension Father     Review of Systems: Noncontributory  PHYSICAL EXAM: Blood pressure 122/74, height 5\' 2"  (1.575 m), weight  220 lb (99.8 kg), unknown if currently breastfeeding. CONSTITUTIONAL: Well-developed, well-nourished female in no acute distress.  HENT:  Normocephalic, atraumatic, External right and left ear normal. Oropharynx is clear and moist EYES: Conjunctivae and EOM are normal. Pupils are equal, round, and reactive to light. No scleral icterus.  NECK: Normal range of motion, supple, no masses SKIN: Skin is warm and dry. No rash noted. Not diaphoretic. No erythema. No pallor. NEUROLGIC: Alert and oriented to person, place, and time. Normal reflexes, muscle tone coordination. No cranial nerve deficit noted. PSYCHIATRIC: Normal mood and affect. Normal behavior. Normal judgment and thought content. CARDIOVASCULAR: Normal heart rate noted, regular rhythm RESPIRATORY: Effort and breath sounds normal, no problems with respiration noted ABDOMEN: Soft, nontender, nondistended. PELVIC: Deferred MUSCULOSKELETAL: Normal range of motion. No edema and no tenderness. 2+ distal pulses.  Labs: No results found for this or any previous visit (from the past 336 hour(s)).  Imaging Studies: No results found.  Assessment:   ICD-10-CM   1. Chronic pelvic pain in female  R10.2    G89.29      Plan: Patient will undergo surgical management with the above-named surgery.   The risks of surgery were discussed  in detail with the patient including but not limited to: bleeding which may require transfusion or reoperation; infection which may require antibiotics; injury to surrounding organs which may involve bowel, bladder, ureters ; need for additional procedures including laparoscopy or laparotomy; thromboembolic phenomenon, surgical site problems and other postoperative/anesthesia complications. Likelihood of success in alleviating the patient's condition was discussed. Routine postoperative instructions will be reviewed with the patient and her family in detail after surgery.  The patient concurred with the proposed plan,  giving informed written consent for the surgery.  Preoperative prophylactic antibiotics, as indicated, and SCDs ordered on call to the OR.    Prentice Docker, MD 11/11/2018 8:25 AM

## 2018-11-11 NOTE — H&P (View-Only) (Signed)
Preoperative History and Physical  Jenna Sims is a 33 y.o. G3P2012 here for surgical management of pelvic pain.   No significant preoperative concerns.  History of Present Illness: 33 y.o. G3P2012 female who presents to discuss options for checking for endometriosis.  She has been doing some internet reading that, to her, indicates that she may have endometriosis.  She states that while she has her menses she has trouble with passing a bowel movement.  She has pressure in her pelvis and a general pain during her menses.  She describes the pain as intense.  So much so that she had to miss work due to the pain.  She continues to have the pain.   Proposed surgery: Diagnostic Laparoscopy  Past Medical History:  Diagnosis Date  . Depression   . HSV-1 infection   . Hypertension    Past Surgical History:  Procedure Laterality Date  . DILATION AND CURETTAGE OF UTERUS    . WISDOM TOOTH EXTRACTION     OB History  Gravida Para Term Preterm AB Living  3 2 2 0 1 2  SAB TAB Ectopic Multiple Live Births  1 0 0 0      # Outcome Date GA Lbr Len/2nd Weight Sex Delivery Anes PTL Lv  3 Term           2 SAB           1 Term           Patient denies any other pertinent gynecologic issues.   Current Outpatient Medications on File Prior to Visit  Medication Sig Dispense Refill  . hydrochlorothiazide (HYDRODIURIL) 12.5 MG tablet Take 12.5 mg by mouth daily.     . lisinopril (ZESTRIL) 40 MG tablet Take 40 mg by mouth daily.      No current facility-administered medications on file prior to visit.    No Known Allergies  Social History:   reports that she has never smoked. She has never used smokeless tobacco. She reports previous alcohol use. She reports that she does not use drugs.  Family History  Problem Relation Age of Onset  . Hypertension Mother   . Hypertension Father     Review of Systems: Noncontributory  PHYSICAL EXAM: Blood pressure 122/74, height 5' 2" (1.575 m), weight  220 lb (99.8 kg), unknown if currently breastfeeding. CONSTITUTIONAL: Well-developed, well-nourished female in no acute distress.  HENT:  Normocephalic, atraumatic, External right and left ear normal. Oropharynx is clear and moist EYES: Conjunctivae and EOM are normal. Pupils are equal, round, and reactive to light. No scleral icterus.  NECK: Normal range of motion, supple, no masses SKIN: Skin is warm and dry. No rash noted. Not diaphoretic. No erythema. No pallor. NEUROLGIC: Alert and oriented to person, place, and time. Normal reflexes, muscle tone coordination. No cranial nerve deficit noted. PSYCHIATRIC: Normal mood and affect. Normal behavior. Normal judgment and thought content. CARDIOVASCULAR: Normal heart rate noted, regular rhythm RESPIRATORY: Effort and breath sounds normal, no problems with respiration noted ABDOMEN: Soft, nontender, nondistended. PELVIC: Deferred MUSCULOSKELETAL: Normal range of motion. No edema and no tenderness. 2+ distal pulses.  Labs: No results found for this or any previous visit (from the past 336 hour(s)).  Imaging Studies: No results found.  Assessment:   ICD-10-CM   1. Chronic pelvic pain in female  R10.2    G89.29      Plan: Patient will undergo surgical management with the above-named surgery.   The risks of surgery were discussed   in detail with the patient including but not limited to: bleeding which may require transfusion or reoperation; infection which may require antibiotics; injury to surrounding organs which may involve bowel, bladder, ureters ; need for additional procedures including laparoscopy or laparotomy; thromboembolic phenomenon, surgical site problems and other postoperative/anesthesia complications. Likelihood of success in alleviating the patient's condition was discussed. Routine postoperative instructions will be reviewed with the patient and her family in detail after surgery.  The patient concurred with the proposed plan,  giving informed written consent for the surgery.  Preoperative prophylactic antibiotics, as indicated, and SCDs ordered on call to the OR.    Prentice Docker, MD 11/11/2018 8:25 AM

## 2018-11-14 ENCOUNTER — Other Ambulatory Visit: Payer: Self-pay

## 2018-11-14 ENCOUNTER — Encounter
Admission: RE | Admit: 2018-11-14 | Discharge: 2018-11-14 | Disposition: A | Discharge status: Home / Self Care | Payer: Federal, State, Local not specified - PPO | Source: Ambulatory Visit | Attending: Obstetrics and Gynecology | Admitting: Obstetrics and Gynecology

## 2018-11-14 DIAGNOSIS — Z20828 Contact with and (suspected) exposure to other viral communicable diseases: Secondary | ICD-10-CM | POA: Diagnosis not present

## 2018-11-14 DIAGNOSIS — R9431 Abnormal electrocardiogram [ECG] [EKG]: Secondary | ICD-10-CM | POA: Insufficient documentation

## 2018-11-14 DIAGNOSIS — R102 Pelvic and perineal pain: Secondary | ICD-10-CM | POA: Diagnosis not present

## 2018-11-14 DIAGNOSIS — I10 Essential (primary) hypertension: Secondary | ICD-10-CM | POA: Diagnosis not present

## 2018-11-14 DIAGNOSIS — G8929 Other chronic pain: Secondary | ICD-10-CM | POA: Insufficient documentation

## 2018-11-14 DIAGNOSIS — Z01818 Encounter for other preprocedural examination: Secondary | ICD-10-CM | POA: Diagnosis not present

## 2018-11-14 HISTORY — DX: Unspecified abdominal pain: R10.9

## 2018-11-14 HISTORY — DX: Low back pain, unspecified: M54.50

## 2018-11-14 HISTORY — DX: Excessive and frequent menstruation with regular cycle: N92.0

## 2018-11-14 LAB — BASIC METABOLIC PANEL
Anion gap: 7 (ref 5–15)
BUN: 6 mg/dL (ref 6–20)
CO2: 25 mmol/L (ref 22–32)
Calcium: 8.7 mg/dL — ABNORMAL LOW (ref 8.9–10.3)
Chloride: 106 mmol/L (ref 98–111)
Creatinine, Ser: 0.66 mg/dL (ref 0.44–1.00)
GFR calc Af Amer: >60 mL/min (ref >60)
GFR calc non Af Amer: >60 mL/min (ref >60)
Glucose, Bld: 112 mg/dL — ABNORMAL HIGH (ref 70–99)
Potassium: 3.1 mmol/L — ABNORMAL LOW (ref 3.5–5.1)
Sodium: 138 mmol/L (ref 135–145)

## 2018-11-14 LAB — CBC
HCT: 40.5 % (ref 36.0–46.0)
Hemoglobin: 12.4 g/dL (ref 12.0–15.0)
MCH: 22.8 pg — ABNORMAL LOW (ref 26.0–34.0)
MCHC: 30.6 g/dL (ref 30.0–36.0)
MCV: 74.6 fL — ABNORMAL LOW (ref 80.0–100.0)
Platelets: 434 10*3/uL — ABNORMAL HIGH (ref 150–400)
RBC: 5.43 MIL/uL — ABNORMAL HIGH (ref 3.87–5.11)
RDW: 16.4 % — ABNORMAL HIGH (ref 11.5–15.5)
WBC: 8.9 10*3/uL (ref 4.0–10.5)
nRBC: 0 % (ref 0.0–0.2)

## 2018-11-14 LAB — SARS CORONAVIRUS 2 (TAT 6-24 HRS): SARS Coronavirus 2: NEGATIVE

## 2018-11-14 NOTE — Pre-Procedure Instructions (Signed)
Called office regarding need for orders.

## 2018-11-14 NOTE — Patient Instructions (Signed)
Your procedure is scheduled on: 11/18/2018 Tues Report to Same Day Surgery 2nd floor medical mall [facility] Entrance-take elevator on left to 2nd floor.  Check in with surgery information desk.) To find out your arrival time please call [phone] between 1PM - 3PM on 11/17/2018 Mon  Remember: Instructions that are not followed completely may result in serious medical risk, up to and including death, or upon the discretion of your surgeon and anesthesiologist your surgery may need to be rescheduled.    _x___ 1. Do not eat food after midnight the night before your procedure. You may drink clear liquids up to 2 hours before you are scheduled to arrive at the hospital for your procedure.  Do not drink clear liquids within 2 hours of your scheduled arrival to the hospital.  Clear liquids include  --Water or Apple juice without pulp  --Clear carbohydrate beverage such as ClearFast or Gatorade  --Black Coffee or Clear Tea (No milk, no creamers, do not add anything to                  the coffee or Tea Type 1 and type 2 diabetics should only drink water.   ____Ensure clear carbohydrate drink on the way to the hospital for bariatric patients  ____Ensure clear carbohydrate drink 3 hours before surgery.   No gum chewing or hard candies.     __x__ 2. No Alcohol for 24 hours before or after surgery.   __x__3. No Smoking or e-cigarettes for 24 prior to surgery.  Do not use any chewable tobacco products for at least 6 hour prior to surgery   ____  4. Bring all medications with you on the day of surgery if instructed.    __x__ 5. Notify your doctor if there is any change in your medical condition     (cold, fever, infections).    x___6. On the morning of surgery brush your teeth with toothpaste and water.  You may rinse your mouth with mouth wash if you wish.  Do not swallow any toothpaste or mouthwash.   Do not wear jewelry, make-up, hairpins, clips or nail polish.  Do not wear lotions,  powders, or perfumes. You may wear deodorant.  Do not shave 48 hours prior to surgery. Men may shave face and neck.  Do not bring valuables to the hospital.    [facility] is not responsible for any belongings or valuables.               Contacts, dentures or bridgework may not be worn into surgery.  Leave your suitcase in the car. After surgery it may be brought to your room.  For patients admitted to the hospital, discharge time is determined by your                       treatment team.  _  Patients discharged the day of surgery will not be allowed to drive home.  You will need someone to drive you home and stay with you the night of your procedure.    Please read over the following fact sheets that you were given:   [facility] Preparing for Surgery and or MRSA Information   _x___ Take anti-hypertensive listed below, cardiac, seizure, asthma,     anti-reflux and psychiatric medicines. These include:  1. None  2.  3.  4.  5.  6.  ____Fleets enema or Magnesium Citrate as directed.   _x___ Use CHG Soap or  sage wipes as directed on instruction sheet   ____ Use inhalers on the day of surgery and bring to hospital day of surgery  ____ Stop Metformin and Janumet 2 days prior to surgery.    ____ Take 1/2 of usual insulin dose the night before surgery and none on the morning     surgery.   _x___ Follow recommendations from Cardiologist, Pulmonologist or PCP regarding          stopping Aspirin, Coumadin, Plavix ,Eliquis, Effient, or Pradaxa, and Pletal.  X____Stop Anti-inflammatories such as Advil, Aleve, Ibuprofen, Motrin, Naproxen, Naprosyn, Goodies powders or aspirin products. OK to take Tylenol and                          Celebrex.   _x___ Stop supplements until after surgery.  But may continue Vitamin D, Vitamin B,       and multivitamin.   ____ Bring C-Pap to the hospital.

## 2018-11-14 NOTE — Pre-Procedure Instructions (Signed)
Called patient regarding need to drink Gatorade prior to surgery.

## 2018-11-17 ENCOUNTER — Other Ambulatory Visit: Payer: Self-pay | Admitting: Obstetrics and Gynecology

## 2018-11-17 ENCOUNTER — Telehealth: Payer: Self-pay | Admitting: Obstetrics and Gynecology

## 2018-11-17 DIAGNOSIS — E876 Hypokalemia: Secondary | ICD-10-CM

## 2018-11-17 MED ORDER — POTASSIUM CHLORIDE ER 10 MEQ PO TBCR: 40.0000 meq | EXTENDED_RELEASE_TABLET | Freq: Every day | ORAL | 0 refills | Status: DC

## 2018-11-17 NOTE — Telephone Encounter (Signed)
Left generic VM regarding hypokalemia. Will call in Rx for potassium that I hope she will be able to take tonight.

## 2018-11-18 ENCOUNTER — Ambulatory Visit: Payer: Federal, State, Local not specified - PPO | Admitting: Registered Nurse

## 2018-11-18 ENCOUNTER — Encounter
Admission: RE | Disposition: A | Discharge status: Home / Self Care | Payer: Self-pay | Source: Ambulatory Visit | Attending: Obstetrics and Gynecology

## 2018-11-18 ENCOUNTER — Other Ambulatory Visit: Payer: Self-pay

## 2018-11-18 ENCOUNTER — Ambulatory Visit
Admission: RE | Admit: 2018-11-18 | Discharge: 2018-11-18 | Disposition: A | Discharge status: Home / Self Care | Payer: Federal, State, Local not specified - PPO | Source: Ambulatory Visit | Attending: Obstetrics and Gynecology | Admitting: Obstetrics and Gynecology

## 2018-11-18 DIAGNOSIS — I10 Essential (primary) hypertension: Secondary | ICD-10-CM | POA: Diagnosis not present

## 2018-11-18 DIAGNOSIS — R102 Pelvic and perineal pain: Secondary | ICD-10-CM | POA: Diagnosis not present

## 2018-11-18 DIAGNOSIS — G894 Chronic pain syndrome: Secondary | ICD-10-CM | POA: Diagnosis not present

## 2018-11-18 DIAGNOSIS — N852 Hypertrophy of uterus: Secondary | ICD-10-CM | POA: Diagnosis not present

## 2018-11-18 DIAGNOSIS — N736 Female pelvic peritoneal adhesions (postinfective): Secondary | ICD-10-CM | POA: Insufficient documentation

## 2018-11-18 DIAGNOSIS — N8003 Adenomyosis of the uterus: Secondary | ICD-10-CM | POA: Diagnosis present

## 2018-11-18 DIAGNOSIS — G8929 Other chronic pain: Secondary | ICD-10-CM | POA: Diagnosis present

## 2018-11-18 DIAGNOSIS — F329 Major depressive disorder, single episode, unspecified: Secondary | ICD-10-CM | POA: Diagnosis not present

## 2018-11-18 HISTORY — PX: LAPAROSCOPY: SHX197

## 2018-11-18 LAB — TYPE AND SCREEN
ABO/RH(D): A NEG
Antibody Screen: NEGATIVE

## 2018-11-18 LAB — POCT I-STAT 4, (NA,K, GLUC, HGB,HCT)
Glucose, Bld: 102 mg/dL — ABNORMAL HIGH (ref 70–99)
HCT: 44 % (ref 36.0–46.0)
Hemoglobin: 15 g/dL (ref 12.0–15.0)
Potassium: 3.4 mmol/L — ABNORMAL LOW (ref 3.5–5.1)
Sodium: 138 mmol/L (ref 135–145)

## 2018-11-18 LAB — ABO/RH: ABO/RH(D): A NEG

## 2018-11-18 LAB — POCT PREGNANCY, URINE: Preg Test, Ur: NEGATIVE

## 2018-11-18 SURGERY — LAPAROSCOPY, DIAGNOSTIC
Anesthesia: General

## 2018-11-18 MED ORDER — PROPOFOL 10 MG/ML IV BOLUS
INTRAVENOUS | Status: AC
  Filled 2018-11-18: qty 20

## 2018-11-18 MED ORDER — SUGAMMADEX SODIUM 200 MG/2ML IV SOLN
INTRAVENOUS | Status: AC
  Filled 2018-11-18: qty 2

## 2018-11-18 MED ORDER — MIDAZOLAM HCL 2 MG/2ML IJ SOLN
INTRAMUSCULAR | Status: DC | PRN
  Administered 2018-11-18: 2 mg via INTRAVENOUS

## 2018-11-18 MED ORDER — LIDOCAINE HCL (CARDIAC) PF 100 MG/5ML IV SOSY
PREFILLED_SYRINGE | INTRAVENOUS | Status: DC | PRN
  Administered 2018-11-18: 100 mg via INTRAVENOUS

## 2018-11-18 MED ORDER — ACETAMINOPHEN NICU IV SYRINGE 10 MG/ML
INTRAVENOUS | Status: AC
  Filled 2018-11-18: qty 1

## 2018-11-18 MED ORDER — LACTATED RINGERS IV SOLN: INTRAVENOUS | Status: DC

## 2018-11-18 MED ORDER — SUGAMMADEX SODIUM 200 MG/2ML IV SOLN
INTRAVENOUS | Status: DC | PRN
  Administered 2018-11-18: 200 mg via INTRAVENOUS

## 2018-11-18 MED ORDER — FENTANYL CITRATE (PF) 100 MCG/2ML IJ SOLN
INTRAMUSCULAR | Status: AC
  Filled 2018-11-18: qty 2

## 2018-11-18 MED ORDER — LIDOCAINE HCL (PF) 2 % IJ SOLN
INTRAMUSCULAR | Status: AC
  Filled 2018-11-18: qty 10

## 2018-11-18 MED ORDER — ROCURONIUM BROMIDE 50 MG/5ML IV SOLN
INTRAVENOUS | Status: AC
  Filled 2018-11-18: qty 1

## 2018-11-18 MED ORDER — LACTATED RINGERS IV SOLN
INTRAVENOUS | Status: DC
  Administered 2018-11-18: 10:00:00 via INTRAVENOUS

## 2018-11-18 MED ORDER — IBUPROFEN 600 MG PO TABS: 600.0000 mg | ORAL_TABLET | Freq: Four times a day (QID) | ORAL | 0 refills | Status: DC | PRN

## 2018-11-18 MED ORDER — ONDANSETRON HCL 4 MG/2ML IJ SOLN
INTRAMUSCULAR | Status: AC
  Filled 2018-11-18: qty 2

## 2018-11-18 MED ORDER — HYDROCODONE-ACETAMINOPHEN 5-325 MG PO TABS: 1.0000 | ORAL_TABLET | Freq: Four times a day (QID) | ORAL | 0 refills | Status: DC | PRN

## 2018-11-18 MED ORDER — MIDAZOLAM HCL 2 MG/2ML IJ SOLN
INTRAMUSCULAR | Status: AC
  Filled 2018-11-18: qty 2

## 2018-11-18 MED ORDER — KETOROLAC TROMETHAMINE 30 MG/ML IJ SOLN
INTRAMUSCULAR | Status: DC | PRN
  Administered 2018-11-18: 30 mg via INTRAVENOUS

## 2018-11-18 MED ORDER — DEXAMETHASONE SODIUM PHOSPHATE 10 MG/ML IJ SOLN
INTRAMUSCULAR | Status: AC
  Filled 2018-11-18: qty 1

## 2018-11-18 MED ORDER — ACETAMINOPHEN 10 MG/ML IV SOLN
INTRAVENOUS | Status: DC | PRN
  Administered 2018-11-18: 1000 mg via INTRAVENOUS

## 2018-11-18 MED ORDER — FENTANYL CITRATE (PF) 100 MCG/2ML IJ SOLN
INTRAMUSCULAR | Status: DC | PRN
  Administered 2018-11-18: 100 ug via INTRAVENOUS

## 2018-11-18 MED ORDER — PROPOFOL 10 MG/ML IV BOLUS
INTRAVENOUS | Status: DC | PRN
  Administered 2018-11-18: 180 mg via INTRAVENOUS

## 2018-11-18 MED ORDER — ONDANSETRON HCL 4 MG/2ML IJ SOLN
INTRAMUSCULAR | Status: DC | PRN
  Administered 2018-11-18: 4 mg via INTRAVENOUS

## 2018-11-18 MED ORDER — BUPIVACAINE HCL 0.5 % IJ SOLN
INTRAMUSCULAR | Status: DC | PRN
  Administered 2018-11-18: 6 mL

## 2018-11-18 MED ORDER — FAMOTIDINE 20 MG PO TABS
ORAL_TABLET | ORAL | Status: AC
  Filled 2018-11-18: qty 1

## 2018-11-18 MED ORDER — ONDANSETRON HCL 4 MG/2ML IJ SOLN
4.0000 mg | Freq: Once | INTRAMUSCULAR | Status: AC | PRN
  Administered 2018-11-18: 4 mg via INTRAVENOUS

## 2018-11-18 MED ORDER — FENTANYL CITRATE (PF) 100 MCG/2ML IJ SOLN: 25.0000 ug | INTRAMUSCULAR | Status: DC | PRN

## 2018-11-18 MED ORDER — FAMOTIDINE 20 MG PO TABS
20.0000 mg | ORAL_TABLET | Freq: Once | ORAL | Status: AC
  Administered 2018-11-18: 20 mg via ORAL

## 2018-11-18 SURGICAL SUPPLY — 47 items
BAG URINE DRAINAGE (UROLOGICAL SUPPLIES) ×3 IMPLANT
BLADE SURG SZ11 CARB STEEL (BLADE) ×3 IMPLANT
CANISTER SUCT 1200ML W/VALVE (MISCELLANEOUS) ×3 IMPLANT
CATH FOLEY 2WAY  5CC 16FR (CATHETERS) ×2
CATH URTH 16FR FL 2W BLN LF (CATHETERS) ×1 IMPLANT
CHLORAPREP W/TINT 26 (MISCELLANEOUS) ×3 IMPLANT
COVER WAND RF STERILE (DRAPES) ×3 IMPLANT
DERMABOND ADVANCED (GAUZE/BANDAGES/DRESSINGS) ×2
DERMABOND ADVANCED .7 DNX12 (GAUZE/BANDAGES/DRESSINGS) ×1 IMPLANT
DRAPE 3/4 80X56 (DRAPES) ×3 IMPLANT
DRAPE LEGGINS SURG 28X43 STRL (DRAPES) ×3 IMPLANT
DRAPE UNDER BUTTOCK W/FLU (DRAPES) ×3 IMPLANT
ELECT REM PT RETURN 9FT ADLT (ELECTROSURGICAL) ×3
ELECTRODE REM PT RTRN 9FT ADLT (ELECTROSURGICAL) ×1 IMPLANT
GLOVE BIO SURGEON STRL SZ7 (GLOVE) ×6 IMPLANT
GLOVE BIOGEL PI IND STRL 7.5 (GLOVE) ×1 IMPLANT
GLOVE BIOGEL PI INDICATOR 7.5 (GLOVE) ×2
GOWN STRL REUS W/ TWL LRG LVL3 (GOWN DISPOSABLE) ×2 IMPLANT
GOWN STRL REUS W/TWL LRG LVL3 (GOWN DISPOSABLE) ×4
IRRIGATION STRYKERFLOW (MISCELLANEOUS) IMPLANT
IRRIGATOR STRYKERFLOW (MISCELLANEOUS)
IV LACTATED RINGERS 1000ML (IV SOLUTION) IMPLANT
KIT PINK PAD W/HEAD ARE REST (MISCELLANEOUS) ×3
KIT PINK PAD W/HEAD ARM REST (MISCELLANEOUS) ×1 IMPLANT
KIT TURNOVER CYSTO (KITS) ×3 IMPLANT
LABEL OR SOLS (LABEL) ×3 IMPLANT
MANIPULATOR UTERINE 4.5 ZUMI (MISCELLANEOUS) ×3 IMPLANT
NEEDLE HYPO 22GX1.5 SAFETY (NEEDLE) ×3 IMPLANT
NS IRRIG 500ML POUR BTL (IV SOLUTION) ×3 IMPLANT
PACK LAP CHOLECYSTECTOMY (MISCELLANEOUS) ×3 IMPLANT
PAD OB MATERNITY 4.3X12.25 (PERSONAL CARE ITEMS) ×3 IMPLANT
PAD PREP 24X41 OB/GYN DISP (PERSONAL CARE ITEMS) ×3 IMPLANT
SCISSORS METZENBAUM CVD 33 (INSTRUMENTS) IMPLANT
SET TUBE SMOKE EVAC HIGH FLOW (TUBING) ×3 IMPLANT
SHEARS HARMONIC ACE PLUS 36CM (ENDOMECHANICALS) IMPLANT
SLEEVE ENDOPATH XCEL 5M (ENDOMECHANICALS) ×3 IMPLANT
SOL PREP PVP 2OZ (MISCELLANEOUS) ×3
SOLUTION PREP PVP 2OZ (MISCELLANEOUS) ×1 IMPLANT
SURGILUBE 2OZ TUBE FLIPTOP (MISCELLANEOUS) ×3 IMPLANT
SUT MNCRL 4-0 (SUTURE)
SUT MNCRL 4-0 27XMFL (SUTURE)
SUT VIC AB 2-0 UR6 27 (SUTURE) IMPLANT
SUTURE MNCRL 4-0 27XMF (SUTURE) IMPLANT
SYR 30ML LL (SYRINGE) ×3 IMPLANT
TROCAR ENDO BLADELESS 11MM (ENDOMECHANICALS) IMPLANT
TROCAR XCEL NON-BLD 5MMX100MML (ENDOMECHANICALS) ×3 IMPLANT
TROCAR XCEL UNIV SLVE 11M 100M (ENDOMECHANICALS) IMPLANT

## 2018-11-18 NOTE — Discharge Instructions (Signed)

## 2018-11-18 NOTE — Transfer of Care (Signed)
Immediate Anesthesia Transfer of Care Note  Patient: Jenna Sims  Procedure(s) Performed: LAPAROSCOPY DIAGNOSTIC (N/A )  Patient Location: PACU  Anesthesia Type:General  Level of Consciousness: awake and alert   Airway & Oxygen Therapy: Patient Spontanous Breathing and Patient connected to face mask oxygen  Post-op Assessment: Report given to RN and Post -op Vital signs reviewed and stable  Post vital signs: Reviewed and stable  Last Vitals:  Vitals Value Taken Time  BP 134/85 11/18/18 1139  Temp 36.2 C 11/18/18 1139  Pulse 81 11/18/18 1148  Resp 17 11/18/18 1148  SpO2 100 % 11/18/18 1148  Vitals shown include unvalidated device data.  Last Pain:  Vitals:   11/18/18 1139  TempSrc:   PainSc: Asleep         Complications: No apparent anesthesia complications

## 2018-11-18 NOTE — Interval H&P Note (Signed)
History and Physical Interval Note:  11/18/2018 10:26 AM  Jenna Sims  has presented today for surgery, with the diagnosis of CHRONIC PELVIC PAIN.  The various methods of treatment have been discussed with the patient and family. After consideration of risks, benefits and other options for treatment, the patient has consented to  Procedure(s): LAPAROSCOPY DIAGNOSTIC (N/A) as a surgical intervention.  The patient's history has been reviewed, patient examined, no change in status, stable for surgery.  I have reviewed the patient's chart and labs.  Questions were answered to the patient's satisfaction.    Prentice Docker, MD, [facility], [facility] 11/18/2018 10:26 AM

## 2018-11-18 NOTE — Anesthesia Preprocedure Evaluation (Addendum)
Anesthesia Evaluation   Patient awake    Reviewed: Allergy & Precautions, H&P , NPO status , Patient's Chart, lab work & pertinent test results, reviewed documented beta blocker date and time   Airway Mallampati: II  TM Distance: >3 FB Neck ROM: full    Dental  (+) Teeth Intact   Pulmonary neg pulmonary ROS,    Pulmonary exam normal        Cardiovascular Exercise Tolerance: Good hypertension, On Medications negative cardio ROS Normal cardiovascular exam Rhythm:regular Rate:Normal     Neuro/Psych PSYCHIATRIC DISORDERS Depression negative neurological ROS     GI/Hepatic negative GI ROS, Neg liver ROS,   Endo/Other  negative endocrine ROS  Renal/GU negative Renal ROS  negative genitourinary   Musculoskeletal   Abdominal   Peds  Hematology negative hematology ROS (+)   Anesthesia Other Findings Past Medical History: No date: Abdominal pain No date: Depression No date: Heavy menstrual bleeding No date: HSV-1 infection No date: Hypertension No date: Lower back pain Past Surgical History: No date: DILATION AND CURETTAGE OF UTERUS No date: WISDOM TOOTH EXTRACTION   Reproductive/Obstetrics negative OB ROS                            Anesthesia Physical Anesthesia Plan  ASA: III  Anesthesia Plan: General ETT   Post-op Pain Management:    Induction:   PONV Risk Score and Plan: 4 or greater  Airway Management Planned:   Additional Equipment:   Intra-op Plan:   Post-operative Plan:   Informed Consent: I have reviewed the patients History and Physical, chart, labs and discussed the procedure including the risks, benefits and alternatives for the proposed anesthesia with the patient or authorized representative who has indicated his/her understanding and acceptance.     Dental Advisory Given  Plan Discussed with: CRNA  Anesthesia Plan Comments:        Anesthesia Quick  Evaluation

## 2018-11-18 NOTE — Op Note (Signed)
  Operative Note    Pre-Operative Diagnosis: Chronic pelvic pain in female  Post-Operative Diagnosis: Chronic pelvic pain in female  Procedures: Diagnostic Laparoscopy  Primary Surgeon: Prentice Docker, MD   EBL: 10 mL   IVF: 400 mL crystalloid  Urine output: 200 mL  Specimens: none  Drains: none  Complications: None   Disposition: PACU   Condition: Stable   Findings:  1) globally enlarged uterus 2) normal appearing bilateral fallopian tubes and ovaries 3) filmy adhesion of large epiploica to left round ligament 4) A single possible Allen-Masterson window in the area where the posterior uterus and posterior cul-de-sac meet.  Procedure Summary:  The patient was taken to the operating room where general anesthesia was administered and found to be adequate. She was placed in the dorsal supine lithotomy position in [place] stirrups and prepped and draped in usual sterile fashion. After a timeout was called an indwelling catheter was placed in her bladder. A sterile speculum was placed in the vagina and a single-tooth tenaculum was used to grasp the anterior lip of the cervix. A Zumi uterine manipulator was affixed to the cervix in accordance with the manufacturer's recommendations. The speculum was removed from the vagina.  Attention was turned to the abdomen where after injection of local anesthetic, a 5 mm infraumbilical incision was made with the scalpel. Entry into the abdomen was obtained via Optiview trocar technique (a blunt entry technique with camera visualization through the obturator upon entry). Verification of entry into the abdomen was obtained using opening pressures. The abdomen was insufflated with CO2. The camera was introduced through the trocar with verification of atraumatic entry.  A 5 mm suprapubic port site was created under direct, intra-abdominal camera visualization.  An inspection of the upper abdomen, lower abdomen, and pelvis was undertaken with the  above-noted findings.  No suspicious areas of endometriosis were identified.  Therefore, the surgery was terminated at this point.   The abdomen was emptied of CO2 with the aid of five deep breaths from anesthesia.  Both trocars were removed without difficulty. Hemostasis was noted at the trocar sites and the skin was closed using surgical skin glue.    Attention was returned to the pelvis.  The Zumi uterine manipulator was removed without difficulty. A sterile speculum was placed. The vagina was verified to be clear of instruments and sponges.  The tenaculum entry sites were hemostatic. The speculum was removed. The Foley catheter was removed without difficulty.    The patient tolerated the procedure well.  Sponge, lap, needle, and instrument counts were correct x 2.  VTE prophylaxis: SCDs. Antibiotic prophylaxis: none indicated and none received. She was awakened in the operating room and was taken to the PACU in stable condition.   Prentice Docker, MD 11/18/2018 11:28 AM

## 2018-11-18 NOTE — Anesthesia Procedure Notes (Deleted)
Performed by: Karne Ozga R, CRNA       

## 2018-11-18 NOTE — Anesthesia Postprocedure Evaluation (Signed)
Anesthesia Post Note  Patient: Jenna Sims  Procedure(s) Performed: LAPAROSCOPY DIAGNOSTIC (N/A )  Patient location during evaluation: PACU Anesthesia Type: General Level of consciousness: awake and alert Pain management: pain level controlled Vital Signs Assessment: post-procedure vital signs reviewed and stable Respiratory status: spontaneous breathing, nonlabored ventilation, respiratory function stable and patient connected to nasal cannula oxygen Cardiovascular status: blood pressure returned to baseline and stable Postop Assessment: no apparent nausea or vomiting Anesthetic complications: no     Last Vitals:  Vitals:   11/18/18 1139 11/18/18 1148  BP: 134/85   Pulse: 89 81  Resp: 16 17  Temp: (!) 36.2 C   SpO2: 99% 100%    Last Pain:  Vitals:   11/18/18 1139  TempSrc:   PainSc: Asleep                 Molli Barrows

## 2018-11-18 NOTE — Anesthesia Post-op Follow-up Note (Signed)
Anesthesia QCDR form completed.        

## 2018-11-18 NOTE — Anesthesia Procedure Notes (Signed)
Procedure Name: Intubation Date/Time: 11/18/2018 10:42 AM Performed by: Debe Coder, CRNA Pre-anesthesia Checklist: Patient identified, Emergency Drugs available, Suction available and Patient being monitored Patient Re-evaluated:Patient Re-evaluated prior to induction Oxygen Delivery Method: Circle system utilized Preoxygenation: Pre-oxygenation with 100% oxygen Induction Type: IV induction Ventilation: Mask ventilation without difficulty Laryngoscope Size: Mac and 4 Grade View: Grade I Tube type: Oral Number of attempts: 1 Airway Equipment and Method: Stylet and Oral airway Placement Confirmation: ETT inserted through vocal cords under direct vision,  positive ETCO2 and breath sounds checked- equal and bilateral Secured at: 21 cm Tube secured with: Tape Dental Injury: Teeth and Oropharynx as per pre-operative assessment

## 2018-11-24 ENCOUNTER — Telehealth: Payer: Self-pay

## 2018-11-24 NOTE — Telephone Encounter (Signed)
Pt called after hour nurse 11/22/18 at 11:12am c/o being dizzy.  Had laparoscopy dx test for endometriosis.  No bleeding; reported minimal bleeding during procedure.  Has to lean on furniture to ambulate.  Dizziness worsens if turns around too fast or turns head too fast.  No nausea, vomiting, or fever.  [phone]

## 2018-12-03 ENCOUNTER — Other Ambulatory Visit: Payer: Self-pay

## 2018-12-03 ENCOUNTER — Encounter: Payer: Self-pay | Admitting: Obstetrics and Gynecology

## 2018-12-03 ENCOUNTER — Ambulatory Visit (INDEPENDENT_AMBULATORY_CARE_PROVIDER_SITE_OTHER): Payer: Federal, State, Local not specified - PPO | Admitting: Obstetrics and Gynecology

## 2018-12-03 VITALS — BP 140/90 | Ht 61.0 in | Wt 223.0 lb

## 2018-12-03 DIAGNOSIS — R102 Pelvic and perineal pain: Secondary | ICD-10-CM | POA: Diagnosis not present

## 2018-12-03 DIAGNOSIS — Z09 Encounter for follow-up examination after completed treatment for conditions other than malignant neoplasm: Secondary | ICD-10-CM

## 2018-12-03 NOTE — Progress Notes (Signed)
   Postoperative Follow-up Patient presents post op from diagnostic laparoscopy 2 weeks ago for pelvic pain.  Subjective: Eating a regular diet without difficulty. The patient is not having any pain.  Activity: increasing appropriately.  Denies Fevers, chills. Has normal bowel and bladder habits.  Objective: Vitals:   12/03/18 1348  BP: 140/90   Vital Signs: BP 140/90   Ht 5\' 1"  (1.549 m)   Wt 223 lb (101.2 kg)   BMI 42.14 kg/m  Constitutional: Well nourished, well developed female in no acute distress.  HEENT: normal Skin: Warm and dry.  Extremity: no edema  Abdomen: Soft, non-tender, normal bowel sounds; no bruits, organomegaly or masses. clean, dry, intact and without erythema, induration, warmth, and tenderness  Assessment: 33 y.o. s/p diag laparoscopy progressing well  Plan: Patient has done well after surgery with no apparent complications.  I have discussed the post-operative course to date, and the expected progress moving forward.  The patient understands what complications to be concerned about.  I will see the patient in routine follow up, or sooner if needed.     Activity plan: No restriction after 4 more weeks (no heavy lifting > 25 pounds in the mean time)  Reviewed all surgical findings, including review of images with the patient.  She has adenomyosis.  We discussed the significance and expected clinical course of this disease.  She asked questions but desires no treatment at this time.  She will follow-up as needed.  Prentice Docker, MD 12/03/2018, 2:07 PM

## 2018-12-16 LAB — FETAL NONSTRESS TEST

## 2018-12-22 LAB — FETAL NONSTRESS TEST

## 2019-06-04 ENCOUNTER — Telehealth: Payer: Self-pay

## 2019-06-04 ENCOUNTER — Other Ambulatory Visit: Payer: Self-pay | Admitting: Obstetrics and Gynecology

## 2019-06-04 DIAGNOSIS — O219 Vomiting of pregnancy, unspecified: Secondary | ICD-10-CM

## 2019-06-04 DIAGNOSIS — O10919 Unspecified pre-existing hypertension complicating pregnancy, unspecified trimester: Secondary | ICD-10-CM

## 2019-06-04 DIAGNOSIS — O0991 Supervision of high risk pregnancy, unspecified, first trimester: Secondary | ICD-10-CM

## 2019-06-04 MED ORDER — DOXYLAMINE-PYRIDOXINE 10-10 MG PO TBEC: 2.0000 | DELAYED_RELEASE_TABLET | Freq: Every day | ORAL | 5 refills | Status: DC

## 2019-06-04 NOTE — Telephone Encounter (Signed)
Pt calling; is scheduled for NOB c SDJ 3/10; can she get rx for nausea before appt or move appt up with whoever can see her the soonest?  Also, sent MyChart msg re Bps last week and hasn't gotten a response.  [phone]

## 2019-06-04 NOTE — Telephone Encounter (Signed)
Advise

## 2019-06-04 NOTE — Telephone Encounter (Signed)
I responded to her directly through the message she sent in last week.

## 2019-06-09 ENCOUNTER — Other Ambulatory Visit: Payer: Federal, State, Local not specified - PPO

## 2019-06-09 ENCOUNTER — Other Ambulatory Visit: Payer: Self-pay

## 2019-06-09 DIAGNOSIS — O0991 Supervision of high risk pregnancy, unspecified, first trimester: Secondary | ICD-10-CM | POA: Diagnosis not present

## 2019-06-09 DIAGNOSIS — O10919 Unspecified pre-existing hypertension complicating pregnancy, unspecified trimester: Secondary | ICD-10-CM

## 2019-06-10 LAB — BETA HCG QUANT (REF LAB): hCG Quant: 66146 m[IU]/mL

## 2019-06-10 LAB — COMPREHENSIVE METABOLIC PANEL
ALT: 14 IU/L (ref 0–32)
AST: 14 IU/L (ref 0–40)
Albumin/Globulin Ratio: 1.5 (ref 1.2–2.2)
Albumin: 4 g/dL (ref 3.8–4.8)
Alkaline Phosphatase: 59 IU/L (ref 39–117)
BUN/Creatinine Ratio: 10 (ref 9–23)
BUN: 6 mg/dL (ref 6–20)
Bilirubin Total: 0.2 mg/dL (ref 0.0–1.2)
CO2: 22 mmol/L (ref 20–29)
Calcium: 9.1 mg/dL (ref 8.7–10.2)
Chloride: 100 mmol/L (ref 96–106)
Creatinine, Ser: 0.59 mg/dL (ref 0.57–1.00)
GFR calc Af Amer: 141 mL/min/{1.73_m2} (ref >59)
GFR calc non Af Amer: 123 mL/min/{1.73_m2} (ref >59)
Globulin, Total: 2.6 g/dL (ref 1.5–4.5)
Glucose: 121 mg/dL — ABNORMAL HIGH (ref 65–99)
Potassium: 3.5 mmol/L (ref 3.5–5.2)
Sodium: 136 mmol/L (ref 134–144)
Total Protein: 6.6 g/dL (ref 6.0–8.5)

## 2019-06-10 LAB — TSH+FREE T4
Free T4: 1.18 ng/dL (ref 0.82–1.77)
TSH: 1.28 u[IU]/mL (ref 0.450–4.500)

## 2019-06-17 ENCOUNTER — Ambulatory Visit (INDEPENDENT_AMBULATORY_CARE_PROVIDER_SITE_OTHER): Payer: Federal, State, Local not specified - PPO | Admitting: Obstetrics and Gynecology

## 2019-06-17 ENCOUNTER — Encounter: Payer: Self-pay | Admitting: Obstetrics and Gynecology

## 2019-06-17 ENCOUNTER — Other Ambulatory Visit: Payer: Self-pay

## 2019-06-17 ENCOUNTER — Other Ambulatory Visit (HOSPITAL_COMMUNITY)
Admission: RE | Admit: 2019-06-17 | Discharge: 2019-06-17 | Disposition: A | Discharge status: Home / Self Care | Payer: Federal, State, Local not specified - PPO | Source: Ambulatory Visit | Attending: Obstetrics and Gynecology | Admitting: Obstetrics and Gynecology

## 2019-06-17 VITALS — BP 126/84 | Wt 210.0 lb

## 2019-06-17 DIAGNOSIS — Z3A08 8 weeks gestation of pregnancy: Secondary | ICD-10-CM | POA: Diagnosis not present

## 2019-06-17 DIAGNOSIS — O10919 Unspecified pre-existing hypertension complicating pregnancy, unspecified trimester: Secondary | ICD-10-CM | POA: Insufficient documentation

## 2019-06-17 DIAGNOSIS — O99213 Obesity complicating pregnancy, third trimester: Secondary | ICD-10-CM | POA: Insufficient documentation

## 2019-06-17 DIAGNOSIS — O0990 Supervision of high risk pregnancy, unspecified, unspecified trimester: Secondary | ICD-10-CM | POA: Diagnosis not present

## 2019-06-17 DIAGNOSIS — O219 Vomiting of pregnancy, unspecified: Secondary | ICD-10-CM | POA: Insufficient documentation

## 2019-06-17 DIAGNOSIS — Z1379 Encounter for other screening for genetic and chromosomal anomalies: Secondary | ICD-10-CM | POA: Diagnosis not present

## 2019-06-17 DIAGNOSIS — O99210 Obesity complicating pregnancy, unspecified trimester: Secondary | ICD-10-CM | POA: Insufficient documentation

## 2019-06-17 DIAGNOSIS — O99211 Obesity complicating pregnancy, first trimester: Secondary | ICD-10-CM

## 2019-06-17 DIAGNOSIS — O10911 Unspecified pre-existing hypertension complicating pregnancy, first trimester: Secondary | ICD-10-CM

## 2019-06-17 DIAGNOSIS — Z6839 Body mass index (BMI) 39.0-39.9, adult: Secondary | ICD-10-CM

## 2019-06-17 MED ORDER — PROMETHAZINE HCL 25 MG PO TABS: 25.0000 mg | ORAL_TABLET | Freq: Four times a day (QID) | ORAL | 2 refills | Status: DC | PRN

## 2019-06-17 NOTE — Progress Notes (Signed)
New Obstetric Patient H&P   Chief Complaint: "Desires prenatal care"  History of Present Illness: Patient is a 34 y.o. [id number] Not Hispanic or Latino female, sure LMP 04/20/2019 presents with amenorrhea and positive home pregnancy test. Based on her  LMP, her EDD is Estimated Date of Delivery: 01/25/2020 and her EGA is [redacted]w[redacted]d. Cycles are irregular.  Her period previous to her last started in mid-November.  Her last pap smear was 1.5 years ago and was normal.    She had a urine pregnancy test which was positive 3 or 4 week(s)  ago. Her last menstrual period was normal and lasted for  3 or 4 day(s). Since her LMP she claims she has experienced no issues beyond feeling tired. She denies vaginal bleeding. Her past medical history is notable for hypertension and HSV. Her prior pregnancies are notable for high blood pressures near the end of the pregnancy. She states that she did not require magnesium.   Since her LMP, she admits to the use of tobacco products  no She claims she has gained zero pounds since the start of her pregnancy.  There are cats in the home in the home  no  She admits close contact with children on a regular basis  yes  She has had chicken pox in the past no She has had Tuberculosis exposures, symptoms, or previously tested positive for TB   no Current or past history of domestic violence. no  Genetic Screening/Teratology Counseling: (Includes patient, baby's father, or anyone in either family with:)   70. Patient's age >/= 6 at [facility]  no 2. Thalassemia ([place], [place], [place], or Asian background): MCV<80  no 3. Neural tube defect (meningomyelocele, spina bifida, anencephaly)  no 4. Congenital heart defect  no  5. Down syndrome  no 6. Tay-Sachs (Jewish, [place])  no 7. Canavan's Disease  no 8. Sickle cell disease or trait (African)  no  9. Hemophilia or other blood disorders  no  10. Muscular dystrophy  no  11. Cystic fibrosis  no  12. Huntington's Chorea   no  13. Mental retardation/autism: oldest son with autism. Patient never tested for Fragile X premutation.   14. Other inherited genetic or chromosomal disorder  no 15. Maternal metabolic disorder (DM, PKU, etc)  no 16. Patient or FOB with a child with a birth defect not listed above no  16a. Patient or FOB with a birth defect themselves no 17. Recurrent pregnancy loss, or stillbirth  no  18. Any medications since LMP other than prenatal vitamins (include vitamins, supplements, OTC meds, drugs, alcohol)  PNV, labetalol 200 mg bid. 19. Any other genetic/environmental exposure to discuss  no  Infection History:   1. Lives with someone with TB or TB exposed  no  2. Patient or partner has history of genital herpes  yes 3. Rash or viral illness since LMP  no 4. History of STI (GC, CT, HPV, syphilis, HIV)  no 5. History of recent travel :  no  Other pertinent information:  no     Review of Systems:10 point review of systems negative unless otherwise noted in HPI  Past Medical History:  Diagnosis Date  . Abdominal pain   . Depression   . Heavy menstrual bleeding   . HSV-1 infection   . Hypertension   . Lower back pain     Past Surgical History:  Procedure Laterality Date  . DILATION AND CURETTAGE OF UTERUS    . LAPAROSCOPY N/A 11/18/2018   Procedure:  LAPAROSCOPY DIAGNOSTIC;  Surgeon: Conard Novak, MD;  Location: [facility] ORS;  Service: Gynecology;  Laterality: N/A;  . WISDOM TOOTH EXTRACTION      Gynecologic History: Patient's last menstrual period was 04/20/2019.  Obstetric History: [id number], SVD x 2  Family History  Problem Relation Age of Onset  . Hypertension Mother   . Hypertension Father     Social History   Socioeconomic History  . Marital status: Married    Spouse name: Not on file  . Number of children: Not on file  . Years of education: Not on file  . Highest education level: Not on file  Occupational History  . Not on file  Tobacco Use  . Smoking  status: Never Smoker  . Smokeless tobacco: Never Used  Substance and Sexual Activity  . Alcohol use: Not Currently    Alcohol/week: 1.0 standard drinks    Types: 1 Glasses of wine per week  . Drug use: Never  . Sexual activity: Not Currently    Birth control/protection: None  Other Topics Concern  . Not on file  Social History Narrative  . Not on file   Social Determinants of Health   Financial Resource Strain:   . Difficulty of Paying Living Expenses: Not on file  Food Insecurity:   . Worried About Programme researcher, broadcasting/film/video in the Last Year: Not on file  . Ran Out of Food in the Last Year: Not on file  Transportation Needs:   . Lack of Transportation (Medical): Not on file  . Lack of Transportation (Non-Medical): Not on file  Physical Activity:   . Days of Exercise per Week: Not on file  . Minutes of Exercise per Session: Not on file  Stress:   . Feeling of Stress : Not on file  Social Connections:   . Frequency of Communication with Friends and Family: Not on file  . Frequency of Social Gatherings with Friends and Family: Not on file  . Attends Religious Services: Not on file  . Active Member of Clubs or Organizations: Not on file  . Attends Banker Meetings: Not on file  . Marital Status: Not on file  Intimate Partner Violence:   . Fear of Current or Ex-Partner: Not on file  . Emotionally Abused: Not on file  . Physically Abused: Not on file  . Sexually Abused: Not on file    No Known Allergies  Prior to Admission medications   Medication Sig Start Date End Date Taking? Authorizing Provider  labetalol (NORMODYNE) 100 MG tablet Take by mouth. 05/24/19 05/23/20 Yes [provider]  Doxylamine-Pyridoxine (DICLEGIS) 10-10 MG TBEC Take 2 tablets by mouth at bedtime. If symptoms persist, add one tablet in the morning and one in the afternoon 06/04/19   Conard Novak, MD    Physical Exam BP 126/84   Wt 210 lb (95.3 kg)   LMP 04/20/2019   BMI 39.68  kg/m   Physical Exam Constitutional:      General: She is not in acute distress.    Appearance: Normal appearance. She is well-developed.  HENT:     Head: Normocephalic and atraumatic.  Eyes:     General: No scleral icterus.    Conjunctiva/sclera: Conjunctivae normal.  Neck:     Thyroid: No thyromegaly.  Cardiovascular:     Rate and Rhythm: Normal rate and regular rhythm.     Heart sounds: Normal heart sounds. No murmur. No friction rub. No gallop.   Pulmonary:  Effort: Pulmonary effort is normal.     Breath sounds: Normal breath sounds. No wheezing.  Abdominal:     General: There is no distension.     Palpations: Abdomen is soft. There is no mass.     Tenderness: There is no abdominal tenderness. There is no guarding or rebound.     Hernia: No hernia is present. There is no hernia in the left inguinal area or right inguinal area.  Genitourinary:    General: Normal vulva.     Exam position: Lithotomy position.     Tanner stage (genital): 5.     Labia:        Right: No rash, tenderness or lesion.        Left: No rash, tenderness or lesion.      Vagina: Normal.     Cervix: Normal.     Uterus: Enlarged.      Adnexa: Right adnexa normal and left adnexa normal.  Musculoskeletal:        General: No swelling. Normal range of motion.     Cervical back: Normal range of motion and neck supple.  Skin:    General: Skin is warm and dry.     Findings: No rash.  Neurological:     General: No focal deficit present.     Mental Status: She is alert and oriented to person, place, and time.     Cranial Nerves: No cranial nerve deficit.  Psychiatric:        Mood and Affect: Mood normal.        Behavior: Behavior normal.        Judgment: Judgment normal.      Female Chaperone present during breast and/or pelvic exam.   Assessment: 34 y.o. V3X1062 at [redacted]w[redacted]d presenting to initiate prenatal care  Plan: 1) Avoid alcoholic beverages. 2) Patient encouraged not to smoke.  3)  Discontinue the use of all non-medicinal drugs and chemicals.  4) Take prenatal vitamins daily.  5) Nutrition, food safety (fish, cheese advisories, and high nitrite foods) and exercise discussed. 6) Hospital and practice style discussed with cross coverage system.  7) Genetic Screening, such as with 1st Trimester Screening, cell free fetal DNA, AFP testing, and Ultrasound, as well as with amniocentesis and CVS as appropriate, is discussed with patient. At the conclusion of today's visit patient undecided genetic testing 8) Patient is asked about travel to areas at risk for the [place] virus, and counseled to avoid travel and exposure to mosquitoes or sexual partners who may have themselves been exposed to the virus. Testing is discussed, and will be ordered as appropriate.  9) chronic hypertension affecting pregnancy: baseline labs, urine protein-to-creatinine ratio, baby aspirin after [redacted] weeks gestation. 10) Obesity in pregnancy:early 1 hour glucose test.  Dating ultrasound.  11) Nausea and vomiting in pregnancy: will add phenergan tablets to her current regimen.   Thomasene Mohair, MD 06/17/2019 2:49 PM

## 2019-06-18 DIAGNOSIS — O10919 Unspecified pre-existing hypertension complicating pregnancy, unspecified trimester: Secondary | ICD-10-CM | POA: Diagnosis not present

## 2019-06-18 DIAGNOSIS — O0990 Supervision of high risk pregnancy, unspecified, unspecified trimester: Secondary | ICD-10-CM | POA: Diagnosis not present

## 2019-06-18 LAB — COMPREHENSIVE METABOLIC PANEL
ALT: 18 IU/L (ref 0–32)
AST: 18 IU/L (ref 0–40)
Albumin/Globulin Ratio: 1.3 (ref 1.2–2.2)
Albumin: 3.9 g/dL (ref 3.8–4.8)
Alkaline Phosphatase: 58 IU/L (ref 39–117)
BUN/Creatinine Ratio: 8 — ABNORMAL LOW (ref 9–23)
BUN: 5 mg/dL — ABNORMAL LOW (ref 6–20)
Bilirubin Total: 0.3 mg/dL (ref 0.0–1.2)
CO2: 19 mmol/L — ABNORMAL LOW (ref 20–29)
Calcium: 9.3 mg/dL (ref 8.7–10.2)
Chloride: 101 mmol/L (ref 96–106)
Creatinine, Ser: 0.6 mg/dL (ref 0.57–1.00)
GFR calc Af Amer: 140 mL/min/{1.73_m2} (ref >59)
GFR calc non Af Amer: 122 mL/min/{1.73_m2} (ref >59)
Globulin, Total: 2.9 g/dL (ref 1.5–4.5)
Glucose: 81 mg/dL (ref 65–99)
Potassium: 3.6 mmol/L (ref 3.5–5.2)
Sodium: 138 mmol/L (ref 134–144)
Total Protein: 6.8 g/dL (ref 6.0–8.5)

## 2019-06-18 LAB — RPR+RH+ABO+RUB AB+AB SCR+CB...
Antibody Screen: NEGATIVE
HIV Screen 4th Generation wRfx: NONREACTIVE
Hematocrit: 38.9 % (ref 34.0–46.6)
Hemoglobin: 12.3 g/dL (ref 11.1–15.9)
Hepatitis B Surface Ag: NEGATIVE
MCH: 24.5 pg — ABNORMAL LOW (ref 26.6–33.0)
MCHC: 31.6 g/dL (ref 31.5–35.7)
MCV: 78 fL — ABNORMAL LOW (ref 79–97)
Platelets: 384 10*3/uL (ref 150–450)
RBC: 5.02 x10E6/uL (ref 3.77–5.28)
RDW: 14.7 % (ref 11.7–15.4)
RPR Ser Ql: NONREACTIVE
Rh Factor: NEGATIVE
Rubella Antibodies, IGG: 2.54 index (ref >0.99)
Varicella zoster IgG: 564 index (ref >165)
WBC: 11.6 10*3/uL — ABNORMAL HIGH (ref 3.4–10.8)

## 2019-06-18 LAB — SICKLE CELL SCREEN: Sickle Cell Screen: NEGATIVE

## 2019-06-19 LAB — CERVICOVAGINAL ANCILLARY ONLY
Chlamydia: NEGATIVE
Comment: NEGATIVE
Comment: NORMAL
Neisseria Gonorrhea: NEGATIVE

## 2019-06-19 LAB — PROTEIN / CREATININE RATIO, URINE
Creatinine, Urine: 163.5 mg/dL
Protein, Ur: 20.3 mg/dL
Protein/Creat Ratio: 124 mg/g creat (ref 0–200)

## 2019-06-20 LAB — URINE CULTURE

## 2019-06-25 LAB — URINE DRUG PANEL 7
Amphetamines, Urine: NEGATIVE ng/mL
Barbiturate Quant, Ur: NEGATIVE ng/mL
Benzodiazepine Quant, Ur: NEGATIVE ng/mL
Cannabinoid Quant, Ur: NEGATIVE ng/mL
Cocaine (Metab.): NEGATIVE ng/mL
Opiate Quant, Ur: NEGATIVE ng/mL
PCP Quant, Ur: NEGATIVE ng/mL

## 2019-06-29 LAB — INHERITEST CORE(CF97,SMA,FRAX)

## 2019-07-02 ENCOUNTER — Other Ambulatory Visit: Payer: Federal, State, Local not specified - PPO

## 2019-07-02 ENCOUNTER — Ambulatory Visit (INDEPENDENT_AMBULATORY_CARE_PROVIDER_SITE_OTHER): Payer: Federal, State, Local not specified - PPO | Admitting: Certified Nurse Midwife

## 2019-07-02 ENCOUNTER — Encounter: Payer: Self-pay | Admitting: Certified Nurse Midwife

## 2019-07-02 ENCOUNTER — Ambulatory Visit (INDEPENDENT_AMBULATORY_CARE_PROVIDER_SITE_OTHER): Payer: Federal, State, Local not specified - PPO

## 2019-07-02 ENCOUNTER — Other Ambulatory Visit: Payer: Self-pay

## 2019-07-02 VITALS — BP 160/110 | Wt 217.0 lb

## 2019-07-02 DIAGNOSIS — Z3689 Encounter for other specified antenatal screening: Secondary | ICD-10-CM

## 2019-07-02 DIAGNOSIS — O10919 Unspecified pre-existing hypertension complicating pregnancy, unspecified trimester: Secondary | ICD-10-CM

## 2019-07-02 DIAGNOSIS — O10911 Unspecified pre-existing hypertension complicating pregnancy, first trimester: Secondary | ICD-10-CM

## 2019-07-02 DIAGNOSIS — O99211 Obesity complicating pregnancy, first trimester: Secondary | ICD-10-CM | POA: Diagnosis not present

## 2019-07-02 DIAGNOSIS — O0990 Supervision of high risk pregnancy, unspecified, unspecified trimester: Secondary | ICD-10-CM

## 2019-07-02 DIAGNOSIS — Z3A11 11 weeks gestation of pregnancy: Secondary | ICD-10-CM

## 2019-07-02 DIAGNOSIS — O09891 Supervision of other high risk pregnancies, first trimester: Secondary | ICD-10-CM

## 2019-07-02 DIAGNOSIS — Z3A10 10 weeks gestation of pregnancy: Secondary | ICD-10-CM

## 2019-07-02 LAB — POCT URINALYSIS DIPSTICK OB
Glucose, UA: NEGATIVE
POC,PROTEIN,UA: NEGATIVE

## 2019-07-02 NOTE — Progress Notes (Signed)
HROB at 10wk3d: CHTN and BMI 41. Had 1 hour GTT today and dating ultrasound. Had very irregular menses prior to conceiving. Declines genetic testing. Blood pressures at home on the labetalol 200 mgm BID usually 140-150/90s to 100. Denies headache or CP.  Blood pressures elevated here 130/106, 160/110 today. Negative proteinuria CMP, PC ratio from 2 weeks ago were normal Dating scan today: SIUP with CRL 11wk1d with FCA 164. A couple small subserosal fibroids seen. Will change EDC to reflect ultrasound due to irregular menses. EDC changed to 01/20/2020  P: Increase labetalol to 300 mgm BID ROB for blood pressure check in 1 weeks  Farrel Conners, CNM

## 2019-07-02 NOTE — Lactation Note (Signed)
Lactation Consultation Note  Patient Name: Jenna Sims [id number] Date: 07/02/2019     Lactation student discussed benefits of breastfeeding per the Ready, Set, Baby curriculum. Jenna Sims encouraged to review breastfeeding information on Ready, set, [employer] site and given information for virtual breastfeeding classes.    Jenna Sims Jenna Sims 07/02/2019, 10:56 AM

## 2019-07-02 NOTE — Progress Notes (Signed)
No concerns.  Second BP was after having pt think about her favorite place. rj

## 2019-07-03 LAB — GLUCOSE, 1 HOUR GESTATIONAL: Gestational Diabetes Screen: 141 mg/dL — ABNORMAL HIGH (ref 65–139)

## 2019-07-08 ENCOUNTER — Other Ambulatory Visit: Payer: Self-pay

## 2019-07-08 ENCOUNTER — Ambulatory Visit (INDEPENDENT_AMBULATORY_CARE_PROVIDER_SITE_OTHER): Payer: Federal, State, Local not specified - PPO | Admitting: Certified Nurse Midwife

## 2019-07-08 VITALS — BP 144/96

## 2019-07-08 DIAGNOSIS — R519 Headache, unspecified: Secondary | ICD-10-CM

## 2019-07-08 DIAGNOSIS — O10911 Unspecified pre-existing hypertension complicating pregnancy, first trimester: Secondary | ICD-10-CM

## 2019-07-08 DIAGNOSIS — O0991 Supervision of high risk pregnancy, unspecified, first trimester: Secondary | ICD-10-CM

## 2019-07-08 DIAGNOSIS — O0990 Supervision of high risk pregnancy, unspecified, unspecified trimester: Secondary | ICD-10-CM

## 2019-07-08 DIAGNOSIS — R7309 Other abnormal glucose: Secondary | ICD-10-CM

## 2019-07-08 DIAGNOSIS — O99891 Other specified diseases and conditions complicating pregnancy: Secondary | ICD-10-CM

## 2019-07-08 DIAGNOSIS — O10919 Unspecified pre-existing hypertension complicating pregnancy, unspecified trimester: Secondary | ICD-10-CM

## 2019-07-08 DIAGNOSIS — Z3A12 12 weeks gestation of pregnancy: Secondary | ICD-10-CM

## 2019-07-08 DIAGNOSIS — Z363 Encounter for antenatal screening for malformations: Secondary | ICD-10-CM

## 2019-07-08 LAB — POCT URINALYSIS DIPSTICK OB: Glucose, UA: NEGATIVE

## 2019-07-08 NOTE — Progress Notes (Signed)
C/o H/A today; has had this H/A since Monday; has taken tylenol without much relief - adv e.s.tylenol two po q6h while awake and is allowed 16oz of caffeine a day incl chocoloate.rj

## 2019-07-11 NOTE — Progress Notes (Signed)
HROB at 12 weeks: CHTN on 300 mgm BID. Having a pressure sensation behind her right eye. Headache/ pressure started on Monday (2 days ago). Has cut out her caffeine (drank one cup of coffee in AM). No nausea or visual changes with the pressure.   1 hour GTT was elevated 141 NOB and baseline labs were normal Desires NT, but not sure if she desires the first trimester test  P: Continue 300 mgm labetalol BID 3 hour GTT, NT and blood pressure check in 1 week DIscussed treatment of headache  Farrel Conners, CNM

## 2019-07-17 ENCOUNTER — Other Ambulatory Visit: Payer: Self-pay

## 2019-07-17 ENCOUNTER — Other Ambulatory Visit: Payer: Federal, State, Local not specified - PPO

## 2019-07-17 ENCOUNTER — Ambulatory Visit (INDEPENDENT_AMBULATORY_CARE_PROVIDER_SITE_OTHER): Payer: Federal, State, Local not specified - PPO

## 2019-07-17 ENCOUNTER — Ambulatory Visit (INDEPENDENT_AMBULATORY_CARE_PROVIDER_SITE_OTHER): Payer: Federal, State, Local not specified - PPO | Admitting: Obstetrics and Gynecology

## 2019-07-17 ENCOUNTER — Encounter: Payer: Self-pay | Admitting: Obstetrics and Gynecology

## 2019-07-17 VITALS — BP 142/90 | Wt 219.0 lb

## 2019-07-17 DIAGNOSIS — Z3A13 13 weeks gestation of pregnancy: Secondary | ICD-10-CM | POA: Diagnosis not present

## 2019-07-17 DIAGNOSIS — Z3682 Encounter for antenatal screening for nuchal translucency: Secondary | ICD-10-CM

## 2019-07-17 DIAGNOSIS — O10011 Pre-existing essential hypertension complicating pregnancy, first trimester: Secondary | ICD-10-CM

## 2019-07-17 DIAGNOSIS — O09891 Supervision of other high risk pregnancies, first trimester: Secondary | ICD-10-CM

## 2019-07-17 DIAGNOSIS — O219 Vomiting of pregnancy, unspecified: Secondary | ICD-10-CM

## 2019-07-17 DIAGNOSIS — Z363 Encounter for antenatal screening for malformations: Secondary | ICD-10-CM

## 2019-07-17 DIAGNOSIS — O99211 Obesity complicating pregnancy, first trimester: Secondary | ICD-10-CM

## 2019-07-17 DIAGNOSIS — R7309 Other abnormal glucose: Secondary | ICD-10-CM | POA: Diagnosis not present

## 2019-07-17 DIAGNOSIS — O0990 Supervision of high risk pregnancy, unspecified, unspecified trimester: Secondary | ICD-10-CM | POA: Diagnosis not present

## 2019-07-17 DIAGNOSIS — Z6839 Body mass index (BMI) 39.0-39.9, adult: Secondary | ICD-10-CM

## 2019-07-17 DIAGNOSIS — O10919 Unspecified pre-existing hypertension complicating pregnancy, unspecified trimester: Secondary | ICD-10-CM

## 2019-07-17 NOTE — Progress Notes (Signed)
Routine Prenatal Care Visit  Subjective  Jenna Sims is a 34 y.o. [phone] at [redacted]w[redacted]d being seen today for ongoing prenatal care.  She is currently monitored for the following issues for this high-risk pregnancy and has Menorrhagia with irregular cycle; Adenomyosis; Chronic pelvic pain in female; Supervision of high risk pregnancy, antepartum; Chronic hypertension affecting pregnancy; Obesity affecting pregnancy; BMI 39.0-39.9,adult; and Nausea and vomiting during pregnancy on their problem list.  ----------------------------------------------------------------------------------- Patient reports no complaints.    . Vag. Bleeding: None.   . Leaking Fluid denies.  NT u/s today with labs.  NT 2.5 mm. ----------------------------------------------------------------------------------- The following portions of the patient's history were reviewed and updated as appropriate: allergies, current medications, past family history, past medical history, past social history, past surgical history and problem list. Problem list updated.  Objective  Blood pressure (!) 142/90, weight 219 lb (99.3 kg), last menstrual period 04/20/2019, unknown if currently breastfeeding. Pregravid weight 210 lb (95.3 kg) Total Weight Gain 9 lb (4.082 kg) Urinalysis: Urine Protein    Urine Glucose    Fetal Status: Fetal Heart Rate (bpm): 148 ([place])         General:  Alert, oriented and cooperative. Patient is in no acute distress.  Skin: Skin is warm and dry. No rash noted.   Cardiovascular: Normal heart rate noted  Respiratory: Normal respiratory effort, no problems with respiration noted  Abdomen: Soft, gravid, appropriate for gestational age.       Pelvic:  Cervical exam deferred        Extremities: Normal range of motion.     Mental Status: Normal mood and affect. Normal behavior. Normal judgment and thought content.   Imaging Results US Fetal Nuchal Translucency Measurement  Result Date: 07/17/2019 Patient Name:  Jenna Sims DOB: [birth year] MRN: [id number] ULTRASOUND REPORT Location: [facility] Date of Service: 07/17/2019 Indications:First Trimester Screen - NT Findings: Singleton intrauterine pregnancy is visualized with a CRL consistent with [redacted]w[redacted]d  gestation, giving an (U/S) EDD of 01/16/2020. The (U/S) EDD is consistent with the clinically established Estimated Date of Delivery: 01/20/20. FHR: 148 BPM CRL measurement: 76.7 mm NT measurement: 2.5 mm. Amnion is not visualized. Nasal Bone is Present Impression: 1. [redacted]w[redacted]d viable Singleton Intrauterine pregnancy by U/S. 2. (U/S) EDD is consistent with Clinically established Estimated Date of Delivery: 01/20/20. 3. NT Screen is successfully completed. Deanna Artis, RT There is a viable  singleton gestation.  The fetal biometry correlates with established dating Detailed evaluation of the fetal anatomy is precluded by early gestational age.The NT measurement is less than 27mm.    It must be noted that a normal ultrasound is unable to definitively rule out fetal aneuploidy.  Thomasene Mohair, MD, [facility], [facility] 07/17/2019, 9:35 AM.     Assessment   33 y.o. [id number] at [redacted]w[redacted]d by  01/20/2020, by Ultrasound presenting for routine prenatal visit  Plan   pregnancy Problems (from 06/17/19 to present)    Problem Noted Resolved   Supervision of high risk pregnancy, antepartum 06/17/2019 by Conard Novak, MD No   Overview Signed 07/02/2019 12:25 PM by Farrel Conners, CNM    Clinic [facility] Prenatal Labs  Dating 11wk1d [place] Blood type: A/Negative/-- (03/10 1543)   Genetic Screen 1 Screen:    AFP:     Quad:     NIPS: Antibody:Negative (03/10 1543)  Anatomic [place]  Rubella: 2.54 (03/10 1543) Varicella: Immune  GTT Early:  Third trimester:  RPR: Non Reactive (03/10 1543)   Rhogam  HBsAg: Negative (03/10 1543)   TDaP vaccine                       Flu Shot: HIV: Non Reactive (03/10 1543)   Baby Food                                 GBS:   Contraception  Pap:  CBB     CS/VBAC    Support Person           Chronic hypertension affecting pregnancy 06/17/2019 by Will Bonnet, MD No   Overview Signed 07/02/2019 12:27 PM by Dalia Heading, CNM    [ ]  Aspirin 81 mg daily after 12 weeks; discontinue after 36 weeks [x baseline labs with CBC, CMP, urine protein/creatinine ratio [ ]  no BP meds unless BPs become elevated [ ]  ultrasound for growth at 28, 32, 36 weeks [ ]  Aspirin 81 mg daily after 12 weeks; discontinue after 36 weeks [ ]  Baseline EKG   Current antihypertensives: 3/11 labetalol 200 mgm BID 3/25 labetalol increased to 300 mgm BID  Baseline and surveillance labs (pulled in from [facility], refresh links as needed)  Lab Results  Component Value Date   PLT 384 06/17/2019   CREATININE 0.60 06/17/2019   AST 18 06/17/2019   ALT 18 06/17/2019    Antenatal Testing CHTN - O10.919  Group I  BP < 140/90, no preeclampsia, AGA,  nml AFV, +/- meds    Group II BP > 140/90, on meds, no preeclampsia, AGA, nml AFV  20-28-34-38  20-24-28-32-35-38  32//2 x wk  28//BPP wkly then 32//2 x wk  40 no meds; 39 meds  PRN or 37  Pre-eclampsia  GHTN - O13.9/Preeclampsia without severe features  - O14.00   Preeclampsia with severe features - O14.10  Q 3-4wks  Q 2 wks  28//BPP wkly then 32//2 x wk  Inpatient  37  PRN or 34        Obesity affecting pregnancy 06/17/2019 by Will Bonnet, MD No   BMI 39.0-39.9,adult 06/17/2019 by Will Bonnet, MD No   Nausea and vomiting during pregnancy 06/17/2019 by Will Bonnet, MD No       Preterm labor symptoms and general obstetric precautions including but not limited to vaginal bleeding, contractions, leaking of fluid and fetal movement were reviewed in detail with the patient. Please refer to After Visit Summary for other counseling recommendations.   - 3h gtt today - NT labs today  Return in about 4 weeks (around 08/14/2019) for Routine  Prenatal Appointment.  Prentice Docker, MD, [facility], [facility] 07/17/2019 9:44 AM

## 2019-07-18 LAB — GESTATIONAL GLUCOSE TOLERANCE
Glucose, Fasting: 85 mg/dL (ref 65–94)
Glucose, GTT - 1 Hour: 175 mg/dL (ref 65–179)
Glucose, GTT - 2 Hour: 153 mg/dL (ref 65–154)
Glucose, GTT - 3 Hour: 124 mg/dL (ref 65–139)

## 2019-07-21 LAB — FIRST TRIMESTER SCREEN W/NT
CRL: 76.7 mm
DIA MoM: 1.16
DIA Value: 182 pg/mL
Gest Age-Collect: 13.4 weeks
Maternal Age At EDD: 31.8 yr
Nuchal Translucency MoM: 1.36
Nuchal Translucency: 2.5 mm
Number of Fetuses: 1
PAPP-A MoM: 1.66
PAPP-A Value: 1457.2 ng/mL
Sonographer ID#: 83416
Test Results:: NEGATIVE
Weight: 219 [lb_av]
hCG MoM: 0.61
hCG Value: 38.7 IU/mL

## 2019-08-14 ENCOUNTER — Other Ambulatory Visit: Payer: Self-pay

## 2019-08-14 ENCOUNTER — Ambulatory Visit (INDEPENDENT_AMBULATORY_CARE_PROVIDER_SITE_OTHER): Payer: Federal, State, Local not specified - PPO | Admitting: Certified Nurse Midwife

## 2019-08-14 VITALS — BP 132/84 | Wt 221.0 lb

## 2019-08-14 DIAGNOSIS — Z3A17 17 weeks gestation of pregnancy: Secondary | ICD-10-CM

## 2019-08-14 DIAGNOSIS — Z363 Encounter for antenatal screening for malformations: Secondary | ICD-10-CM

## 2019-08-14 DIAGNOSIS — O0990 Supervision of high risk pregnancy, unspecified, unspecified trimester: Secondary | ICD-10-CM

## 2019-08-14 DIAGNOSIS — O0992 Supervision of high risk pregnancy, unspecified, second trimester: Secondary | ICD-10-CM

## 2019-08-14 LAB — POCT URINALYSIS DIPSTICK OB
Glucose, UA: NEGATIVE
POC,PROTEIN,UA: NEGATIVE

## 2019-08-14 NOTE — Progress Notes (Signed)
ROB

## 2019-08-14 NOTE — Progress Notes (Signed)
Routine Prenatal Care Visit  Subjective  Jenna Sims is a 34 y.o. [phone] at [redacted]w[redacted]d being seen today for ongoing prenatal care.  She is currently monitored for the following issues for this high-risk pregnancy and has Menorrhagia with irregular cycle; Adenomyosis; Chronic pelvic pain in female; Supervision of high risk pregnancy, antepartum; Chronic hypertension affecting pregnancy; Obesity affecting pregnancy; BMI 39.0-39.9,adult; and Nausea and vomiting during pregnancy on their problem list.  ----------------------------------------------------------------------------------- Patient reports decreased nausea, "not a daily thing anymore". HAs also had  some intermittent lightheadedness, but her blood pressures were not low. .    Leaking Fluid denies. Denies vaginal bleeding ----------------------------------------------------------------------------------- The following portions of the patient's history were reviewed and updated as appropriate: allergies, current medications, past family history, past medical history, past social history, past surgical history and problem list. Problem list updated.  Objective  Blood pressure 132/84, weight 221 lb (100.2 kg), last menstrual period 04/20/2019, unknown if currently breastfeeding. Pregravid weight 210 lb (95.3 kg) Total Weight Gain 11 lb (4.99 kg) BMI now 41.76 kg/m2 Urinalysis: Urine Protein Negative  Urine Glucose Negative FH: U-1. FHT 140 Fetal Status: has felt some flutters First trimester test was negative 3 Hour GTT was normal General:  Alert, oriented and cooperative. Patient is in no acute distress.  Skin: Skin is warm and dry. No rash noted.   Cardiovascular: Normal blood pressure on labetalol 300 mgm BID  Respiratory: Normal respiratory effort, no problems with respiration noted  Abdomen: Soft, gravid, appropriate for gestational age.       Pelvic:  Cervical exam deferred        Extremities: Normal range of motion.      Mental Status: Normal mood and affect. Normal behavior. Normal judgment and thought content.   Assessment   34 y.o. [id number] at [redacted]w[redacted]d by  01/20/2020, by Ultrasound presenting for routine prenatal visit CHTN: normotensive today Plan   pregnancy Problems (from 06/17/19 to present)    Problem Noted Resolved   Supervision of high risk pregnancy, antepartum 06/17/2019 by Will Bonnet, MD No   Overview Signed 07/02/2019 12:25 PM by Dalia Heading, [place] Prenatal Labs  Dating 11wk1d [place] Blood type: A/Negative/-- (03/10 1543)   Genetic Screen 1 Screen: negative   AFP: declined    Antibody:Negative (03/10 1543)  Anatomic [place]  Rubella: 2.54 (03/10 1543) Varicella: Immune  GTT Early: 141, nl 3 hour GTT              Third trimester:  RPR: Non Reactive (03/10 1543)   Rhogam  HBsAg: Negative (03/10 1543)   TDaP vaccine                       Flu Shot: HIV: Non Reactive (03/10 1543)   Baby Food                                GBS:   Contraception  Pap:  CBB     CS/VBAC    Support Person           Chronic hypertension affecting pregnancy 06/17/2019 by Will Bonnet, MD No   Overview Signed 07/02/2019 12:27 PM by Dalia Heading, CNM    [ ]  Aspirin 81 mg daily after 12 weeks; discontinue after 36 weeks [x baseline labs with CBC, CMP, urine protein/creatinine ratio [ ]  no BP meds unless BPs become elevated [ ]  ultrasound for  growth at 28, 32, 36 weeks [ ]  Aspirin 81 mg daily after 12 weeks; discontinue after 36 weeks [ ]  Baseline EKG   Current antihypertensives: 3/11 labetalol 200 mgm BID 3/25 labetalol increased to 300 mgm BID  Baseline and surveillance labs (pulled in from [facility], refresh links as needed)  Lab Results  Component Value Date   PLT 384 06/17/2019   CREATININE 0.60 06/17/2019   AST 18 06/17/2019   ALT 18 06/17/2019    Antenatal Testing CHTN - O10.919  Group I  BP < 140/90, no preeclampsia, AGA,  nml AFV, +/- meds    Group II BP > 140/90, on  meds, no preeclampsia, AGA, nml AFV  20-28-34-38  20-24-28-32-35-38  32//2 x wk  28//BPP wkly then 32//2 x wk  40 no meds; 39 meds  PRN or 37  Pre-eclampsia  GHTN - O13.9/Preeclampsia without severe features  - O14.00   Preeclampsia with severe features - O14.10  Q 3-4wks  Q 2 wks  28//BPP wkly then 32//2 x wk  Inpatient  37  PRN or 34        Obesity affecting pregnancy 06/17/2019 by 01-05-1989, MD No   BMI 39.0-39.9,adult 06/17/2019 by 12-22-1988, MD No   Nausea and vomiting during pregnancy 06/17/2019 by Conard Novak, MD No       Discussed avoiding fast changes in position to avoid lightheadedness Continue current dose of labetalol Anatomy scan and ROB in 2-3 weeks Declined MSAFP test today.  08/17/2019, CNM

## 2019-08-24 ENCOUNTER — Encounter: Payer: Federal, State, Local not specified - PPO | Admitting: Obstetrics

## 2019-08-24 NOTE — Telephone Encounter (Signed)
Please advise 

## 2019-08-24 NOTE — Telephone Encounter (Signed)
Patient is schedule for 08/25/19 with [facility]

## 2019-08-24 NOTE — Telephone Encounter (Signed)
Appt work in this week w any OB

## 2019-08-25 ENCOUNTER — Ambulatory Visit (INDEPENDENT_AMBULATORY_CARE_PROVIDER_SITE_OTHER): Payer: Federal, State, Local not specified - PPO | Admitting: Obstetrics

## 2019-08-25 ENCOUNTER — Other Ambulatory Visit: Payer: Self-pay

## 2019-08-25 DIAGNOSIS — Z363 Encounter for antenatal screening for malformations: Secondary | ICD-10-CM

## 2019-08-25 DIAGNOSIS — O10919 Unspecified pre-existing hypertension complicating pregnancy, unspecified trimester: Secondary | ICD-10-CM

## 2019-08-25 DIAGNOSIS — O0990 Supervision of high risk pregnancy, unspecified, unspecified trimester: Secondary | ICD-10-CM

## 2019-08-25 NOTE — Patient Instructions (Signed)

## 2019-08-25 NOTE — Progress Notes (Signed)
BP check  

## 2019-08-25 NOTE — Progress Notes (Signed)
Routine Prenatal Care Visit  Subjective  Jenna Sims is a 34 y.o. [phone] at [redacted]w[redacted]d being seen today for ongoing prenatal care.  She is currently monitored for the following issues for this high-risk pregnancy and has Menorrhagia with irregular cycle; Adenomyosis; Chronic pelvic pain in female; Supervision of high risk pregnancy, antepartum; Chronic hypertension affecting pregnancy; Obesity affecting pregnancy; BMI 39.0-39.9,adult; and Nausea and vomiting during pregnancy on their problem list.  ----------------------------------------------------------------------------------- Patient reports headache over the weekend with some elevated BPS during that time. In general this past week, her morning Bps have ranged 140s/90s. In the mid to late afternoon, the readings have been 160s /high 90s, and over the weekend she reached 175/105 with an accompanying headache. Today she denies any headache, chest pain or blurred vision. Her morning read was 142/88 today. She is taking Labetalol 300mg  BID.  . Vag. Bleeding: None.   . Leaking Fluid denies.  ----------------------------------------------------------------------------------- The following portions of the patient's history were reviewed and updated as appropriate: allergies, current medications, past family history, past medical history, past social history, past surgical history and problem list. Problem list updated.  Objective  Blood pressure 130/78, weight 221 lb (100.2 kg), last menstrual period 04/20/2019, unknown if currently breastfeeding. Pregravid weight 210 lb (95.3 kg) Total Weight Gain 11 lb (4.99 kg) Urinalysis: Urine Protein    Urine Glucose    Fetal Status:           General:  Alert, oriented and cooperative. Patient is in no acute distress.  Skin: Skin is warm and dry. No rash noted.   Cardiovascular: Normal heart rate noted  Respiratory: Normal respiratory effort, no problems with respiration noted  Abdomen: Soft, gravid,  appropriate for gestational age.       Pelvic:  Cervical exam deferred        Extremities: Normal range of motion.     Mental Status: Normal mood and affect. Normal behavior. Normal judgment and thought content.   Assessment   34 y.o. 38 at [redacted]w[redacted]d by  01/20/2020, by Ultrasound presenting for BP check visit prenatal visit  Plan   pregnancy Problems (from 06/17/19 to present)    Problem Noted Resolved   Supervision of high risk pregnancy, antepartum 06/17/2019 by 08/17/2019, MD No   Overview Addendum 08/14/2019  8:37 AM by 10/14/2019, CNM    [facility] Prenatal Labs  Dating 11wk1d Farrel Conners Blood type: A/Negative/-- (03/10 1543)   Genetic Screen 1 Screen: neg   AFP: declines    Antibody:Negative (03/10 1543)  Anatomic 08-01-2005  Rubella: 2.54 (03/10 1543) Varicella: Immune  GTT Early:  141, 3hr WNL             Third trimester:  RPR: Non Reactive (03/10 1543)   Rhogam  HBsAg: Negative (03/10 1543)   TDaP vaccine                       Flu Shot: HIV: Non Reactive (03/10 1543)   Baby Food                                GBS:   Contraception  BTL or IUD Pap:  CBB     CS/VBAC    Support Person           Chronic hypertension affecting pregnancy 06/17/2019 by 08/17/2019, MD No   Overview Signed 07/02/2019 12:27 PM by 07/04/2019, CNM    [ ]   Aspirin 81 mg daily after 12 weeks; discontinue after 36 weeks [x baseline labs with CBC, CMP, urine protein/creatinine ratio [ ]  no BP meds unless BPs become elevated [ ]  ultrasound for growth at 28, 32, 36 weeks [ ]  Aspirin 81 mg daily after 12 weeks; discontinue after 36 weeks [ ]  Baseline EKG   Current antihypertensives: 3/11 labetalol 200 mgm BID 3/25 labetalol increased to 300 mgm BID  Baseline and surveillance labs (pulled in from [facility], refresh links as needed)  Lab Results  Component Value Date   PLT 384 06/17/2019   CREATININE 0.60 06/17/2019   AST 18 06/17/2019   ALT 18 06/17/2019    Antenatal Testing CHTN  - O10.919  Group I  BP < 140/90, no preeclampsia, AGA,  nml AFV, +/- meds    Group II BP > 140/90, on meds, no preeclampsia, AGA, nml AFV  20-28-34-38  20-24-28-32-35-38  32//2 x wk  28//BPP wkly then 32//2 x wk  40 no meds; 39 meds  PRN or 37  Pre-eclampsia  GHTN - O13.9/Preeclampsia without severe features  - O14.00   Preeclampsia with severe features - O14.10  Q 3-4wks  Q 2 wks  28//BPP wkly then 32//2 x wk  Inpatient  37  PRN or 34        Obesity affecting pregnancy 06/17/2019 by Will Bonnet, MD No   BMI 39.0-39.9,adult 06/17/2019 by Will Bonnet, MD No   Nausea and vomiting during pregnancy 06/17/2019 by Will Bonnet, MD No       Preterm labor symptoms, hypertensive sxs and general obstetric precautions including but not limited to vaginal bleeding, contractions, leaking of fluid and fetal movement were reviewed in detail with the patient. Please refer to After Visit Summary for other counseling recommendations.  As she had not started yet on a daily ASA, we reviewed the reasons for this and she will begin taking it. In light of her higher BP readings, we will increase her Labetalol dosage to 400 mg po BID. She is recording her BP values at home.  Return in about 1 weeks (around 09/08/2019) for return OB Please set up appt wtih MD for every other visit.. She already has an appt for next week for her AS ultrasound. Education regarding her CHTN provided.  Imagene Riches, CNM  08/25/2019 2:13 PM

## 2019-09-04 ENCOUNTER — Other Ambulatory Visit: Payer: Self-pay

## 2019-09-04 ENCOUNTER — Ambulatory Visit (INDEPENDENT_AMBULATORY_CARE_PROVIDER_SITE_OTHER): Payer: Federal, State, Local not specified - PPO | Admitting: Certified Nurse Midwife

## 2019-09-04 ENCOUNTER — Ambulatory Visit (INDEPENDENT_AMBULATORY_CARE_PROVIDER_SITE_OTHER): Payer: Federal, State, Local not specified - PPO

## 2019-09-04 VITALS — BP 124/72 | Wt 220.2 lb

## 2019-09-04 DIAGNOSIS — O0990 Supervision of high risk pregnancy, unspecified, unspecified trimester: Secondary | ICD-10-CM

## 2019-09-04 DIAGNOSIS — O10919 Unspecified pre-existing hypertension complicating pregnancy, unspecified trimester: Secondary | ICD-10-CM

## 2019-09-04 DIAGNOSIS — Z3A19 19 weeks gestation of pregnancy: Secondary | ICD-10-CM | POA: Diagnosis not present

## 2019-09-04 DIAGNOSIS — O0992 Supervision of high risk pregnancy, unspecified, second trimester: Secondary | ICD-10-CM

## 2019-09-04 DIAGNOSIS — Z3A20 20 weeks gestation of pregnancy: Secondary | ICD-10-CM

## 2019-09-04 DIAGNOSIS — Z363 Encounter for antenatal screening for malformations: Secondary | ICD-10-CM | POA: Diagnosis not present

## 2019-09-04 NOTE — Progress Notes (Signed)
HROB/ anatomy scan. Risk factors include CHTN/ BMI 41.61kg/m2- currently on labetalol 400 mgm BID. No lightheadedness or dizziness. BP today 124/72 and wt down 1#. Trace edema in lower extremities. Reports +FM  Anatomy scan: CGA 19wk5d, normal anatomy, female gender, FHR 155, anterior placenta.  A: IUP at [facility] with S=D CHTN-normotensive on labetalol 400 mgm BID  P: RTO 2 weeks for ROB/BP check  Farrel Conners, CNM

## 2019-09-18 ENCOUNTER — Other Ambulatory Visit: Payer: Self-pay

## 2019-09-18 ENCOUNTER — Encounter: Payer: Self-pay | Admitting: Certified Nurse Midwife

## 2019-09-18 ENCOUNTER — Ambulatory Visit (INDEPENDENT_AMBULATORY_CARE_PROVIDER_SITE_OTHER): Payer: Federal, State, Local not specified - PPO | Admitting: Certified Nurse Midwife

## 2019-09-18 VITALS — BP 138/88 | Wt 223.0 lb

## 2019-09-18 DIAGNOSIS — O10919 Unspecified pre-existing hypertension complicating pregnancy, unspecified trimester: Secondary | ICD-10-CM

## 2019-09-18 DIAGNOSIS — O10912 Unspecified pre-existing hypertension complicating pregnancy, second trimester: Secondary | ICD-10-CM

## 2019-09-18 DIAGNOSIS — O0992 Supervision of high risk pregnancy, unspecified, second trimester: Secondary | ICD-10-CM

## 2019-09-18 DIAGNOSIS — Z3A22 22 weeks gestation of pregnancy: Secondary | ICD-10-CM

## 2019-09-18 DIAGNOSIS — O0990 Supervision of high risk pregnancy, unspecified, unspecified trimester: Secondary | ICD-10-CM

## 2019-09-18 LAB — POCT URINALYSIS DIPSTICK OB
Glucose, UA: NEGATIVE
POC,PROTEIN,UA: NEGATIVE

## 2019-09-18 NOTE — Progress Notes (Signed)
ROB- no concerns 

## 2019-09-18 NOTE — Progress Notes (Signed)
ROB at 22wk2d: CHTN on labetalol 400 mgm BID. Obesity with current BMI 42.14 kg/m2. Reports some SOB and increased swelling in lower extremities over this past week. Swelling resolves with elevating legs. No headache, CP, N/V, visual changes or RUQ pain. +FM  FH 27cm. FHT 156, negative proteinuria. BP 138/88. +1 Pedal edema  A: IUP at 22wk2d with CHTN on labetalol BP OK on labetalol 400 mgm BID  P: Preeclampsia warning signs reviewed ROB in 2 weeks for BP check.  Farrel Conners, CNM

## 2019-09-23 ENCOUNTER — Other Ambulatory Visit: Payer: Self-pay

## 2019-09-23 ENCOUNTER — Ambulatory Visit (INDEPENDENT_AMBULATORY_CARE_PROVIDER_SITE_OTHER): Payer: Federal, State, Local not specified - PPO | Admitting: Obstetrics

## 2019-09-23 ENCOUNTER — Encounter: Payer: Self-pay | Admitting: Emergency Medicine

## 2019-09-23 ENCOUNTER — Emergency Department
Admission: EM | Admit: 2019-09-23 | Discharge: 2019-09-23 | Disposition: A | Discharge status: Home / Self Care | Payer: Federal, State, Local not specified - PPO | Attending: Emergency Medicine | Admitting: Emergency Medicine

## 2019-09-23 VITALS — BP 166/98 | Wt 222.0 lb

## 2019-09-23 DIAGNOSIS — O139 Gestational [pregnancy-induced] hypertension without significant proteinuria, unspecified trimester: Secondary | ICD-10-CM | POA: Diagnosis not present

## 2019-09-23 DIAGNOSIS — Z3A23 23 weeks gestation of pregnancy: Secondary | ICD-10-CM

## 2019-09-23 DIAGNOSIS — O10919 Unspecified pre-existing hypertension complicating pregnancy, unspecified trimester: Secondary | ICD-10-CM

## 2019-09-23 DIAGNOSIS — O0990 Supervision of high risk pregnancy, unspecified, unspecified trimester: Secondary | ICD-10-CM

## 2019-09-23 DIAGNOSIS — R079 Chest pain, unspecified: Secondary | ICD-10-CM | POA: Diagnosis not present

## 2019-09-23 DIAGNOSIS — I10 Essential (primary) hypertension: Secondary | ICD-10-CM

## 2019-09-23 DIAGNOSIS — Z3A18 18 weeks gestation of pregnancy: Secondary | ICD-10-CM | POA: Diagnosis not present

## 2019-09-23 LAB — URINALYSIS, COMPLETE (UACMP) WITH MICROSCOPIC
Bilirubin Urine: NEGATIVE
Glucose, UA: NEGATIVE mg/dL
Hgb urine dipstick: NEGATIVE
Ketones, ur: 80 mg/dL — AB
Nitrite: NEGATIVE
Protein, ur: NEGATIVE mg/dL
Specific Gravity, Urine: 1.013 (ref 1.005–1.030)
pH: 7 (ref 5.0–8.0)

## 2019-09-23 LAB — CBC
HCT: 32.7 % — ABNORMAL LOW (ref 36.0–46.0)
Hemoglobin: 10.6 g/dL — ABNORMAL LOW (ref 12.0–15.0)
MCH: 23.2 pg — ABNORMAL LOW (ref 26.0–34.0)
MCHC: 32.4 g/dL (ref 30.0–36.0)
MCV: 71.6 fL — ABNORMAL LOW (ref 80.0–100.0)
Platelets: 377 10*3/uL (ref 150–400)
RBC: 4.57 MIL/uL (ref 3.87–5.11)
RDW: 15.3 % (ref 11.5–15.5)
WBC: 13.3 10*3/uL — ABNORMAL HIGH (ref 4.0–10.5)
nRBC: 0 % (ref 0.0–0.2)

## 2019-09-23 LAB — TROPONIN I (HIGH SENSITIVITY)
Troponin I (High Sensitivity): 6 ng/L
Troponin I (High Sensitivity): 7 ng/L

## 2019-09-23 LAB — BASIC METABOLIC PANEL WITH GFR
Anion gap: 10 (ref 5–15)
BUN: <5 mg/dL — ABNORMAL LOW (ref 6–20)
CO2: 22 mmol/L (ref 22–32)
Calcium: 8.6 mg/dL — ABNORMAL LOW (ref 8.9–10.3)
Chloride: 104 mmol/L (ref 98–111)
Creatinine, Ser: 0.5 mg/dL (ref 0.44–1.00)
GFR calc Af Amer: >60 mL/min
GFR calc non Af Amer: >60 mL/min
Glucose, Bld: 71 mg/dL (ref 70–99)
Potassium: 3.1 mmol/L — ABNORMAL LOW (ref 3.5–5.1)
Sodium: 136 mmol/L (ref 135–145)

## 2019-09-23 MED ORDER — LABETALOL HCL 5 MG/ML IV SOLN
5.0000 mg | Freq: Once | INTRAVENOUS | Status: AC
  Administered 2019-09-23: 5 mg via INTRAVENOUS
  Filled 2019-09-23: qty 4

## 2019-09-23 NOTE — Progress Notes (Signed)
Ch met with Pt and Pt's husband in the ED hallway. Ch chose to stop by and check on them after passing by them so many times. They let chaplain know about waiting to check out possible pre-eclampsia concerns. Pt let Ch know that she was feeling much better, and that they will be leaving soon. They were grateful for visit.

## 2019-09-23 NOTE — ED Provider Notes (Signed)
[facility] Emergency Department Provider Note ____________________________________________   First MD Initiated Contact with Patient 09/23/19 1956     (approximate)  I have reviewed the triage vital signs and the nursing notes.   HISTORY  Chief Complaint Hypertension, Headache, and Chest Pain    HPI Jenna Sims is a 34 y.o. female with G4P2 at 40 weeks with PMH as noted below including hypertension who presents with elevated blood pressure today associated with headache and chest pain.  The patient states the symptoms started this morning while she was at work.  She initially had a headache, then developed some pain in the chest that felt like pressure, and checked her blood pressure and it was in the 160s systolic.  She called her OB/GYN practice and was told to come in for an appointment.  Once there, she remained hypertensive and was sent to the ED for further evaluation.  The patient states that she takes 400 mg of labetalol twice daily and has been compliant.  She states that the chest pain has now resolved.  She still has a mild diffuse headache.  The patient reports that she had preeclampsia during a previous pregnancy and developed persistent hypertension requiring treatment after the pregnancy.  Past Medical History:  Diagnosis Date  . Abdominal pain   . Depression   . Heavy menstrual bleeding   . HSV-1 infection   . Hypertension   . Lower back pain     Patient Active Problem List   Diagnosis Date Noted  . Supervision of high risk pregnancy, antepartum 06/17/2019  . Chronic hypertension affecting pregnancy 06/17/2019  . Obesity affecting pregnancy 06/17/2019  . BMI 39.0-39.9,adult 06/17/2019  . Nausea and vomiting during pregnancy 06/17/2019  . Chronic pelvic pain in female 11/18/2018  . Adenomyosis 01/17/2018  . Menorrhagia with irregular cycle 01/08/2018  . Hypertension 11/09/2016  . Postpartum depression 02/10/2013  . Essential  hypertension in pregnancy 05/15/2012  . Genital herpes 11/08/2011  . Type A blood, Rh negative 11/08/2011  . Vitamin D deficiency 08/04/2008    Past Surgical History:  Procedure Laterality Date  . DILATION AND CURETTAGE OF UTERUS    . LAPAROSCOPY N/A 11/18/2018   Procedure: LAPAROSCOPY DIAGNOSTIC;  Surgeon: Conard Novak, MD;  Location: [facility] ORS;  Service: Gynecology;  Laterality: N/A;  . WISDOM TOOTH EXTRACTION      Prior to Admission medications   Medication Sig Start Date End Date Taking? Authorizing Provider  labetalol (NORMODYNE) 100 MG tablet Take by mouth. 05/24/19 05/23/20  [provider]  Prenatal Vit-Fe Fumarate-FA (MULTIVITAMIN-PRENATAL) 27-0.8 MG TABS tablet Take 1 tablet by mouth daily at 12 noon.    [provider]  promethazine (PHENERGAN) 25 MG tablet Take 1 tablet (25 mg total) by mouth every 6 (six) hours as needed for nausea or vomiting. 06/17/19   Conard Novak, MD    Allergies Patient has no known allergies.  Family History  Problem Relation Age of Onset  . Hypertension Mother   . Hypertension Father     Social History Social History   Tobacco Use  . Smoking status: Never Smoker  . Smokeless tobacco: Never Used  Vaping Use  . Vaping Use: Never used  Substance Use Topics  . Alcohol use: Not Currently    Alcohol/week: 1.0 standard drink    Types: 1 Glasses of wine per week  . Drug use: Never    Review of Systems  Constitutional: No fever/chills. Eyes: No visual changes. ENT:  No sore throat. Cardiovascular: Positive for resolved chest pain. Respiratory: Denies shortness of breath. Gastrointestinal: No vomiting or diarrhea.  Genitourinary: Negative for dysuria.  Musculoskeletal: Negative for back pain. Skin: Negative for rash. Neurological: Positive for headache.   ____________________________________________   PHYSICAL EXAM:  VITAL SIGNS: ED Triage Vitals  Enc Vitals Group     BP 09/23/19 1241 (!) 157/103      Pulse Rate 09/23/19 1241 81     Resp 09/23/19 1241 19     Temp 09/23/19 1241 98.5 F (36.9 C)     Temp Source 09/23/19 1241 Oral     SpO2 09/23/19 1241 99 %     Weight 09/23/19 1230 222 lb (100.7 kg)     Height 09/23/19 1230 5\' 1"  (1.549 m)     Head Circumference --      Peak Flow --      Pain Score 09/23/19 1229 6     Pain Loc --      Pain Edu? --      Excl. in GC? --     Constitutional: Alert and oriented. Well appearing and in no acute distress. Eyes: Conjunctivae are normal.  Head: Atraumatic. Nose: No congestion/rhinnorhea. Mouth/Throat: Mucous membranes are moist.   Neck: Normal range of motion.  Cardiovascular: Normal rate, regular rhythm.   Good peripheral circulation. Respiratory: Normal respiratory effort.  No retractions.  Gastrointestinal: No distention.  Musculoskeletal: No lower extremity edema.  Extremities warm and well perfused.  Neurologic:  Normal speech and language.  Motor intact in all extremities.  Normal coordination.  No gross focal neurologic deficits are appreciated.  Skin:  Skin is warm and dry. No rash noted. Psychiatric: Mood and affect are normal. Speech and behavior are normal.  ____________________________________________   LABS (all labs ordered are listed, but only abnormal results are displayed)  Labs Reviewed  BASIC METABOLIC PANEL - Abnormal; Notable for the following components:      Result Value   Potassium 3.1 (*)    BUN <5 (*)    Calcium 8.6 (*)    All other components within normal limits  CBC - Abnormal; Notable for the following components:   WBC 13.3 (*)    Hemoglobin 10.6 (*)    HCT 32.7 (*)    MCV 71.6 (*)    MCH 23.2 (*)    All other components within normal limits  URINALYSIS, COMPLETE (UACMP) WITH MICROSCOPIC - Abnormal; Notable for the following components:   Color, Urine YELLOW (*)    APPearance CLOUDY (*)    Ketones, ur 80 (*)    Leukocytes,Ua LARGE (*)    Bacteria, UA FEW (*)    All other components within  normal limits  TROPONIN I (HIGH SENSITIVITY)  TROPONIN I (HIGH SENSITIVITY)   ____________________________________________  EKG  ED ECG REPORT I, 09/25/19, the attending physician, personally viewed and interpreted this ECG.  Date: 09/23/2019 EKG Time: 1237 Rate: 86 Rhythm: normal sinus rhythm QRS Axis: normal Intervals: normal ST/T Wave abnormalities: normal Narrative Interpretation: no evidence of acute ischemia  ____________________________________________  RADIOLOGY    ____________________________________________   PROCEDURES  Procedure(s) performed: No  Procedures  Critical Care performed: No ____________________________________________   INITIAL IMPRESSION / ASSESSMENT AND PLAN / ED COURSE  Pertinent labs & imaging results that were available during my care of the patient were reviewed by me and considered in my medical decision making (see chart for details).  34 year old female G4P2 at 1 weeks presents with elevated blood pressure associated  with headache as well as chest pain that has now resolved.  I reviewed the past medical records in epic.  The patient has no recent prior ED visits or admissions.  She was seen at the [facility] outpatient clinic at 1130 this morning and had a blood pressure 166/98 at that time.  She had already taken her labetalol this morning.  She was sent to the ED for further evaluation.  On exam currently, the patient is well-appearing.  Her vital signs are normal except for a blood pressure of 151/80.  This is after she had waited more than 6 hours to be seen.  The physical exam is otherwise unremarkable.  Neurologic exam is nonfocal.  Lab work-up was obtained from triage.  Troponins are negative x2.  Lab work-up is unremarkable except for mild leukocytosis and anemia.  There are no findings concerning for preeclampsia, with normal platelets and no protein on the urinalysis.  EKG shows no ischemic findings.  At  this time, there is no clinical evidence for preeclampsia, no evidence of hypertensive emergency or end organ dysfunction.  I will give a dose of IV labetalol and consult OB/GYN for further recommendations.  ----------------------------------------- 9:30 PM on 09/23/2019 -----------------------------------------  I discussed the case with Dr. Kenton Kingfisher from [place].  He agreed that there is no evidence of preeclampsia.  He agreed with IV labetalol and advised that if the patient's blood pressure improved to 140/90 she would be appropriate for discharge home with follow-up.  If it continues to be elevated and was refractory, the patient might need admission.  However, the patient had almost immediate improvement in her blood pressure to 135/70 after the first dose of labetalol.  She reported that the headache improved and continue to have no recurrent chest pain.  She felt well to go home.    I recommended that the patient increase her dose of labetalol to 500 mg twice daily (she has 100 mg tablet so does not need a new prescription immediately).  I instructed her to call the [facility] office tomorrow, and to monitor her blood pressure closely.  I gave her thorough return precautions and she expressed understanding.  ____________________________________________   FINAL CLINICAL IMPRESSION(S) / ED DIAGNOSES  Final diagnoses:  Hypertension, unspecified type      NEW MEDICATIONS STARTED DURING THIS VISIT:  Discharge Medication List as of 09/23/2019  8:51 PM       Note:  This document was prepared using Dragon voice recognition software and may include unintentional dictation errors.   Arta Silence, MD 09/23/19 2131

## 2019-09-23 NOTE — Discharge Instructions (Addendum)
You may increase your dose of labetalol to 500 mg twice daily.  Keep an eye on your blood pressure over the next few days.  Call your OB/GYN at [facility] within the next few days to arrange for follow-up.  Return to the ER for new, worsening, or persistent severe headache, chest pain, shortness of breath, weakness or lightheadedness, or continued blood pressures higher than 140/90.

## 2019-09-23 NOTE — ED Triage Notes (Signed)
Pt reports is [redacted] weeks pregnant and this am her blood pressure was elevated and she had a HA and some chest discomfort. Pt reports called her OB and was told to come to the ED.

## 2019-09-23 NOTE — Progress Notes (Signed)
BP check. Bad HA, chest pain, has had blurry vision.

## 2019-09-23 NOTE — Progress Notes (Signed)
Work in at 23 weeks. Maisen is a G4P2 with a High Risk pregnancy marked by Chronic HTN, obesity, some earlier N and V. She arrives to the office as a work in, c/o headache, slight chest pain,and visual disturbances. Her baby is moving well, and she denies any contractions or LOF or vaginal bleeding. Already took her Labetalol 400 mg po this morning, as well as her daily ASA. She went to work this morning, and developed the above sxs. Her BP is 166/98 in the office. Sent over to [facility] for VI anthypertensives and evaluation. Maebelle Munroe CNM contacted and notified of patient's  Arrival. Shavette has a ROB scheduled for next week. Mirna Mires, CNM  09/23/2019 12:24 PM

## 2019-09-23 NOTE — ED Notes (Signed)
FETAL HEART TONES ASSESSED AT 162BPM ON RIGHT LOWER ABD

## 2019-09-25 ENCOUNTER — Other Ambulatory Visit: Payer: Self-pay

## 2019-09-25 ENCOUNTER — Encounter: Payer: Self-pay | Admitting: Advanced Practice Midwife

## 2019-09-25 ENCOUNTER — Ambulatory Visit (INDEPENDENT_AMBULATORY_CARE_PROVIDER_SITE_OTHER): Payer: Federal, State, Local not specified - PPO | Admitting: Advanced Practice Midwife

## 2019-09-25 VITALS — BP 130/90 | Wt 220.0 lb

## 2019-09-25 DIAGNOSIS — O0992 Supervision of high risk pregnancy, unspecified, second trimester: Secondary | ICD-10-CM

## 2019-09-25 DIAGNOSIS — O10919 Unspecified pre-existing hypertension complicating pregnancy, unspecified trimester: Secondary | ICD-10-CM

## 2019-09-25 DIAGNOSIS — Z3A23 23 weeks gestation of pregnancy: Secondary | ICD-10-CM

## 2019-09-25 NOTE — Progress Notes (Deleted)
Routine Prenatal Care Visit  Subjective  Jenna Sims is a 34 y.o. [phone] at [redacted]w[redacted]d being seen today for ongoing prenatal care.  She is currently monitored for the following issues for this {Blank single:19197::"high-risk","low-risk"} pregnancy and has Menorrhagia with irregular cycle; Adenomyosis; Chronic pelvic pain in female; Supervision of high risk pregnancy, antepartum; Chronic hypertension affecting pregnancy; Obesity affecting pregnancy; BMI 39.0-39.9,adult; Nausea and vomiting during pregnancy; Essential hypertension in pregnancy; Genital herpes; Hypertension; Postpartum depression; Type A blood, Rh negative; and Vitamin D deficiency on their problem list.  ----------------------------------------------------------------------------------- Patient reports {sx:14538}.   Contractions: Not present. Vag. Bleeding: None.  Movement: Present. Leaking Fluid {Actions; denies/reports/admits to:19208}.  ----------------------------------------------------------------------------------- The following portions of the patient's history were reviewed and updated as appropriate: allergies, current medications, past family history, past medical history, past social history, past surgical history and problem list. Problem list updated.  Objective  Blood pressure 130/90, weight 220 lb (99.8 kg), last menstrual period 04/20/2019, unknown if currently breastfeeding. Pregravid weight 210 lb (95.3 kg) Total Weight Gain 10 lb (4.536 kg) Urinalysis: Urine Protein    Urine Glucose    Fetal Status: Fetal Heart Rate (bpm): 136   Movement: Present     General:  Alert, oriented and cooperative. Patient is in no acute distress.  Skin: Skin is warm and dry. No rash noted.   Cardiovascular: Normal heart rate noted  Respiratory: Normal respiratory effort, no problems with respiration noted  Abdomen: Soft, gravid, appropriate for gestational age. Pain/Pressure: Absent     Pelvic:  {Blank single:19197::"Cervical  exam performed","Cervical exam deferred"}        Extremities: Normal range of motion.  Edema: None  Mental Status: Normal mood and affect. Normal behavior. Normal judgment and thought content.   Assessment   34 y.o. [id number] at [redacted]w[redacted]d by  01/20/2020, by Ultrasound presenting for {Blank single:19197::"routine","work-in"} prenatal visit  Plan   pregnancy Problems (from 06/17/19 to present)    Problem Noted Resolved   Supervision of high risk pregnancy, antepartum 06/17/2019 by Will Bonnet, MD No   Overview Addendum 08/14/2019  8:37 AM by Dalia Heading, [place] Prenatal Labs  Dating 11wk1d [place] Blood type: A/Negative/-- (03/10 1543)   Genetic Screen 1 Screen: neg   AFP: declines    Antibody:Negative (03/10 1543)  Anatomic [place]  Rubella: 2.54 (03/10 1543) Varicella: Immune  GTT Early:  141, 3hr WNL             Third trimester:  RPR: Non Reactive (03/10 1543)   Rhogam  HBsAg: Negative (03/10 1543)   TDaP vaccine                       Flu Shot: HIV: Non Reactive (03/10 1543)   Baby Food                                GBS:   Contraception  Pap:  CBB     CS/VBAC    Support Person           Previous Version   Chronic hypertension affecting pregnancy 06/17/2019 by Will Bonnet, MD No   Overview Addendum 08/25/2019  2:26 PM by Imagene Riches, CNM    [ ]  Aspirin 81 mg daily after 12 weeks; discontinue after 36 weeks [x baseline labs with CBC, CMP, urine protein/creatinine ratio [x ] no BP meds unless BPs become elevated- on Labetalol [ ]   ultrasound for growth at 28, 32, 36 weeks [ ]  Aspirin 81 mg daily after 12 weeks; discontinue after 36 weeks. Started 5/18 [ ]  Baseline EKG   Current antihypertensives: 3/11 labetalol 200 mgm BID 3/25 labetalol increased to 300 mgm BID 08/25/19 dose increased to 400mg m BID  Baseline and surveillance labs (pulled in from [facility], refresh links as needed)  Lab Results  Component Value Date   PLT 384 06/17/2019   CREATININE 0.60  06/17/2019   AST 18 06/17/2019   ALT 18 06/17/2019    Antenatal Testing CHTN - O10.919  Group I  BP < 140/90, no preeclampsia, AGA,  nml AFV, +/- meds    Group II BP > 140/90, on meds, no preeclampsia, AGA, nml AFV  20-28-34-38  20-24-28-32-35-38  32//2 x wk  28//BPP wkly then 32//2 x wk  40 no meds; 39 meds  PRN or 37  Pre-eclampsia  GHTN - O13.9/Preeclampsia without severe features  - O14.00   Preeclampsia with severe features - O14.10  Q 3-4wks  Q 2 wks  28//BPP wkly then 32//2 x wk  Inpatient  37  PRN or 34        Previous Version   Obesity affecting pregnancy 06/17/2019 by 01-08-1969, MD No   BMI 39.0-39.9,adult 06/17/2019 by Conard Novak, MD No   Nausea and vomiting during pregnancy 06/17/2019 by 08/17/2019, MD No       {Blank single:19197::"Term","Preterm"} labor symptoms and general obstetric precautions including but not limited to vaginal bleeding, contractions, leaking of fluid and fetal movement were reviewed in detail with the patient. Please refer to After Visit Summary for other counseling recommendations.   No follow-ups on file.  Conard Novak, CNM 09/25/2019 9:27 AM

## 2019-09-25 NOTE — Progress Notes (Signed)
Routine Prenatal Care Visit  Subjective  Jenna Sims is a 34 y.o. [phone] at [redacted]w[redacted]d being seen today for ongoing prenatal care.  She is currently monitored for the following issues for this high-risk pregnancy and has Menorrhagia with irregular cycle; Adenomyosis; Chronic pelvic pain in female; Supervision of high risk pregnancy, antepartum; Chronic hypertension affecting pregnancy; Obesity affecting pregnancy; BMI 39.0-39.9,adult; Nausea and vomiting during pregnancy; Essential hypertension in pregnancy; Genital herpes; Hypertension; Postpartum depression; Type A blood, Rh negative; and Vitamin D deficiency on their problem list.  ----------------------------------------------------------------------------------- Patient reports feeling better today than she did 2 days ago. She is taking her BP medication and staying well hydrated. She reports never being sent to Labor and Delivery 2 days ago and stayed in the ER for 8 hours. She is feeling uncomfortable with the care/lack of care she received while there. I will talk with [name] regarding the situation.     Contractions: Not present. Vag. Bleeding: None.  Movement: Present. Leaking Fluid denies.  ----------------------------------------------------------------------------------- The following portions of the patient's history were reviewed and updated as appropriate: allergies, current medications, past family history, past medical history, past social history, past surgical history and problem list. Problem list updated.  Objective  Blood pressure 130/90, weight 220 lb (99.8 kg), last menstrual period 04/20/2019, unknown if currently breastfeeding. Pregravid weight 210 lb (95.3 kg) Total Weight Gain 10 lb (4.536 kg) Urinalysis: Urine Protein    Urine Glucose    Fetal Status: Fetal Heart Rate (bpm): 136   Movement: Present     General:  Alert, oriented and cooperative. Patient is in no acute distress.  Skin: Skin is warm and dry.  No rash noted.   Cardiovascular: Normal heart rate noted  Respiratory: Normal respiratory effort, no problems with respiration noted  Abdomen: Soft, gravid, appropriate for gestational age. Pain/Pressure: Absent     Pelvic:  Cervical exam deferred        Extremities: Normal range of motion.  Edema: None  Mental Status: Normal mood and affect. Normal behavior. Normal judgment and thought content.   Assessment   34 y.o. [id number] at [redacted]w[redacted]d by  01/20/2020, by Ultrasound presenting for routine prenatal visit  Plan   pregnancy Problems (from 06/17/19 to present)    Problem Noted Resolved   Supervision of high risk pregnancy, antepartum 06/17/2019 by Will Bonnet, MD No   Overview Addendum 08/14/2019  8:37 AM by Dalia Heading, [place] Prenatal Labs  Dating 11wk1d [place] Blood type: A/Negative/-- (03/10 1543)   Genetic Screen 1 Screen: neg   AFP: declines    Antibody:Negative (03/10 1543)  Anatomic [place]  Rubella: 2.54 (03/10 1543) Varicella: Immune  GTT Early:  141, 3hr WNL             Third trimester:  RPR: Non Reactive (03/10 1543)   Rhogam  HBsAg: Negative (03/10 1543)   TDaP vaccine                       Flu Shot: HIV: Non Reactive (03/10 1543)   Baby Food                                GBS:   Contraception  Pap:  CBB     CS/VBAC    Support Person           Previous Version   Chronic hypertension affecting pregnancy 06/17/2019  by Conard Novak, MD No   Overview Addendum 08/25/2019  2:26 PM by Mirna Mires, CNM    [ ]  Aspirin 81 mg daily after 12 weeks; discontinue after 36 weeks [x baseline labs with CBC, CMP, urine protein/creatinine ratio [x ] no BP meds unless BPs become elevated- on Labetalol [ ]  ultrasound for growth at 28, 32, 36 weeks [ ]  Aspirin 81 mg daily after 12 weeks; discontinue after 36 weeks. Started 5/18 [ ]  Baseline EKG   Current antihypertensives: 3/11 labetalol 200 mgm BID 3/25 labetalol increased to 300 mgm BID 08/25/19 dose  increased to 400mg m BID  Baseline and surveillance labs (pulled in from [facility], refresh links as needed)  Lab Results  Component Value Date   PLT 384 06/17/2019   CREATININE 0.60 06/17/2019   AST 18 06/17/2019   ALT 18 06/17/2019    Antenatal Testing CHTN - O10.919  Group I  BP < 140/90, no preeclampsia, AGA,  nml AFV, +/- meds    Group II BP > 140/90, on meds, no preeclampsia, AGA, nml AFV  20-28-34-38  20-24-28-32-35-38  32//2 x wk  28//BPP wkly then 32//2 x wk  40 no meds; 39 meds  PRN or 37  Pre-eclampsia  GHTN - O13.9/Preeclampsia without severe features  - O14.00   Preeclampsia with severe features - O14.10  Q 3-4wks  Q 2 wks  28//BPP wkly then 32//2 x wk  Inpatient  37  PRN or 34        Previous Version   Obesity affecting pregnancy 06/17/2019 by 08/17/2019, MD No   BMI 39.0-39.9,adult 06/17/2019 by 01-05-1989, MD No   Nausea and vomiting during pregnancy 06/17/2019 by Conard Novak, MD No       Preterm labor symptoms and general obstetric precautions including but not limited to vaginal bleeding, contractions, leaking of fluid and fetal movement were reviewed in detail with the patient.   Return for has follow up scheduled.  [birth year]1988, CNM 09/25/2019 9:30 AM

## 2019-09-29 ENCOUNTER — Telehealth: Payer: Self-pay | Admitting: Obstetrics and Gynecology

## 2019-09-29 NOTE — Telephone Encounter (Signed)
Discussed concerns of patient's experience at [facility]. Will discuss with Gala Lewandowsky.  She states that she will continue with our practice for now.

## 2019-10-02 ENCOUNTER — Encounter: Payer: Federal, State, Local not specified - PPO | Admitting: Advanced Practice Midwife

## 2019-10-02 ENCOUNTER — Ambulatory Visit (INDEPENDENT_AMBULATORY_CARE_PROVIDER_SITE_OTHER): Payer: Federal, State, Local not specified - PPO | Admitting: Obstetrics

## 2019-10-02 ENCOUNTER — Other Ambulatory Visit: Payer: Self-pay

## 2019-10-02 ENCOUNTER — Encounter: Payer: Self-pay | Admitting: Obstetrics

## 2019-10-02 VITALS — BP 130/80 | Wt 221.0 lb

## 2019-10-02 DIAGNOSIS — O0990 Supervision of high risk pregnancy, unspecified, unspecified trimester: Secondary | ICD-10-CM

## 2019-10-02 DIAGNOSIS — Z3A24 24 weeks gestation of pregnancy: Secondary | ICD-10-CM

## 2019-10-02 NOTE — Patient Instructions (Addendum)

## 2019-10-02 NOTE — Assessment & Plan Note (Signed)
09/29/2019- SAB. Seen 6/25 for second quantitative HCG. Planned repeat quants,

## 2019-10-02 NOTE — Progress Notes (Signed)
Routine Prenatal Care Visit  Subjective  Jenna Sims is a 34 y.o. [phone] at [redacted]w[redacted]d being seen today for ongoing prenatal care.  She is currently monitored for the following issues for this high-risk pregnancy and has Menorrhagia with irregular cycle; Adenomyosis; Chronic pelvic pain in female; Supervision of high risk pregnancy, antepartum; Chronic hypertension affecting pregnancy; Obesity affecting pregnancy; BMI 39.0-39.9,adult; Nausea and vomiting during pregnancy; Essential hypertension in pregnancy; Genital herpes; Hypertension; Postpartum depression; Type A blood, Rh negative; and Vitamin D deficiency on their problem list.  ----------------------------------------------------------------------------------- Patient reports no complaints.   Contractions: Not present. Vag. Bleeding: None, Scant.  Movement: Present. Leaking Fluid denies.  ----------------------------------------------------------------------------------- The following portions of the patient's history were reviewed and updated as appropriate: allergies, current medications, past family history, past medical history, past social history, past surgical history and problem list. Problem list updated.  Objective  Blood pressure 130/80, weight 221 lb (100.2 kg), last menstrual period 04/20/2019, unknown if currently breastfeeding. Pregravid weight 210 lb (95.3 kg) Total Weight Gain 11 lb (4.99 kg) Urinalysis: Urine Protein    Urine Glucose    Fetal Status:     Movement: Present     General:  Alert, oriented and cooperative. Patient is in no acute distress.  Skin: Skin is warm and dry. No rash noted.   Cardiovascular: Normal heart rate noted  Respiratory: Normal respiratory effort, no problems with respiration noted  Abdomen: Soft, gravid, appropriate for gestational age. Pain/Pressure: Absent     Pelvic:  Cervical exam deferred        Extremities: Normal range of motion.     Mental Status: Normal mood and affect.  Normal behavior. Normal judgment and thought content.   Assessment   34 y.o. [id number] at [redacted]w[redacted]d by  01/20/2020, by Ultrasound presenting for routine prenatal visit  Plan   pregnancy Problems (from 06/17/19 to present)    Problem Noted Resolved   Supervision of high risk pregnancy, antepartum 06/17/2019 by Will Bonnet, MD No   Overview Addendum 08/14/2019  8:37 AM by Dalia Heading, [place] Prenatal Labs  Dating 11wk1d [place] Blood type: A/Negative/-- (03/10 1543)   Genetic Screen 1 Screen: neg   AFP: declines    Antibody:Negative (03/10 1543)  Anatomic [place]  Rubella: 2.54 (03/10 1543) Varicella: Immune  GTT Early:  141, 3hr WNL             Third trimester:  RPR: Non Reactive (03/10 1543)   Rhogam  HBsAg: Negative (03/10 1543)   TDaP vaccine                       Flu Shot: HIV: Non Reactive (03/10 1543)   Baby Food                                GBS:   Contraception  Pap:  CBB     CS/VBAC    Support Person           Previous Version   Chronic hypertension affecting pregnancy 06/17/2019 by Will Bonnet, MD No   Overview Addendum 08/25/2019  2:26 PM by Imagene Riches, CNM    [ ]  Aspirin 81 mg daily after 12 weeks; discontinue after 36 weeks [x baseline labs with CBC, CMP, urine protein/creatinine ratio [x ] no BP meds unless BPs become elevated- on Labetalol [ ]  ultrasound for growth at 28, 32,  36 weeks [ ]  Aspirin 81 mg daily after 12 weeks; discontinue after 36 weeks. Started 5/18 [ ]  Baseline EKG   Current antihypertensives: 3/11 labetalol 200 mgm BID 3/25 labetalol increased to 300 mgm BID 08/25/19 dose increased to 400mg m BID  Baseline and surveillance labs (pulled in from [facility], refresh links as needed)  Lab Results  Component Value Date   PLT 384 06/17/2019   CREATININE 0.60 06/17/2019   AST 18 06/17/2019   ALT 18 06/17/2019    Antenatal Testing CHTN - O10.919  Group I  BP < 140/90, no preeclampsia, AGA,  nml AFV, +/- meds    Group II BP  > 140/90, on meds, no preeclampsia, AGA, nml AFV  20-28-34-38  20-24-28-32-35-38  32//2 x wk  28//BPP wkly then 32//2 x wk  40 no meds; 39 meds  PRN or 37  Pre-eclampsia  GHTN - O13.9/Preeclampsia without severe features  - O14.00   Preeclampsia with severe features - O14.10  Q 3-4wks  Q 2 wks  28//BPP wkly then 32//2 x wk  Inpatient  37  PRN or 34        Previous Version   Obesity affecting pregnancy 06/17/2019 by 01-08-1971, MD No   BMI 39.0-39.9,adult 06/17/2019 by Conard Novak, MD No   Nausea and vomiting during pregnancy 06/17/2019 by 08/17/2019, MD No       Preterm labor symptoms and general obstetric precautions including but not limited to vaginal bleeding, contractions, leaking of fluid and fetal movement were reviewed in detail with the patient. Please refer to After Visit Summary for other counseling recommendations.   Discussed her need for 3 hr GTT at 28 weeks (failed early 1hr GTT).  Will order  grwoth scan for the 28 week visit as well (high BMI)   Return in about 2 weeks (around 10/16/2019).  08/17/2019, CNM  10/02/2019 10:32 AM   12/17/2019, CNM  10/02/2019 10:31 AM

## 2019-10-16 ENCOUNTER — Ambulatory Visit (INDEPENDENT_AMBULATORY_CARE_PROVIDER_SITE_OTHER): Payer: Federal, State, Local not specified - PPO | Admitting: Obstetrics

## 2019-10-16 ENCOUNTER — Other Ambulatory Visit: Payer: Self-pay

## 2019-10-16 VITALS — BP 120/88 | Wt 222.0 lb

## 2019-10-16 DIAGNOSIS — Z3A26 26 weeks gestation of pregnancy: Secondary | ICD-10-CM

## 2019-10-16 DIAGNOSIS — O10919 Unspecified pre-existing hypertension complicating pregnancy, unspecified trimester: Secondary | ICD-10-CM

## 2019-10-16 DIAGNOSIS — O0990 Supervision of high risk pregnancy, unspecified, unspecified trimester: Secondary | ICD-10-CM

## 2019-10-16 DIAGNOSIS — O10912 Unspecified pre-existing hypertension complicating pregnancy, second trimester: Secondary | ICD-10-CM

## 2019-10-16 DIAGNOSIS — O99212 Obesity complicating pregnancy, second trimester: Secondary | ICD-10-CM

## 2019-10-16 DIAGNOSIS — O99211 Obesity complicating pregnancy, first trimester: Secondary | ICD-10-CM

## 2019-10-16 DIAGNOSIS — O0992 Supervision of high risk pregnancy, unspecified, second trimester: Secondary | ICD-10-CM

## 2019-10-16 LAB — POCT URINALYSIS DIPSTICK OB
Glucose, UA: NEGATIVE
POC,PROTEIN,UA: NEGATIVE

## 2019-10-16 NOTE — Progress Notes (Signed)
ROB

## 2019-10-16 NOTE — Progress Notes (Signed)
Routine Prenatal Care Visit  Subjective  Jenna Sims is a 34 y.o. [phone] at [redacted]w[redacted]d being seen today for ongoing prenatal care.  She is currently monitored for the following issues for this high-risk pregnancy and has Menorrhagia with irregular cycle; Adenomyosis; Chronic pelvic pain in female; Supervision of high risk pregnancy, antepartum; Chronic hypertension affecting pregnancy; Obesity affecting pregnancy; BMI 39.0-39.9,adult; Nausea and vomiting during pregnancy; Essential hypertension in pregnancy; Genital herpes; Hypertension; Postpartum depression; Type A blood, Rh negative; and Vitamin D deficiency on their problem list.  ----------------------------------------------------------------------------------- Patient reports no complaints.  She denies danger sxs- no headaches, blurred vision or edema. Taking her daily Labetalol and ASA.  .  .   Pincus Large Fluid denies.  ----------------------------------------------------------------------------------- The following portions of the patient's history were reviewed and updated as appropriate: allergies, current medications, past family history, past medical history, past social history, past surgical history and problem list. Problem list updated.  Objective  Blood pressure 120/88, weight 222 lb (100.7 kg), last menstrual period 04/20/2019, unknown if currently breastfeeding. Pregravid weight 210 lb (95.3 kg) Total Weight Gain 12 lb (5.443 kg) Urinalysis: Urine Protein Negative  Urine Glucose Negative  Fetal Status:           General:  Alert, oriented and cooperative. Patient is in no acute distress.  Skin: Skin is warm and dry. No rash noted.   Cardiovascular: Normal heart rate noted  Respiratory: Normal respiratory effort, no problems with respiration noted  Abdomen: Soft, gravid, appropriate for gestational age.       Pelvic:  Cervical exam deferred        Extremities: Normal range of motion.     Mental Status: Normal mood and  affect. Normal behavior. Normal judgment and thought content.   Assessment   34 y.o. [id number] at [redacted]w[redacted]d by  01/20/2020, by Ultrasound presenting for routine prenatal visit  Plan   pregnancy Problems (from 06/17/19 to present)    Problem Noted Resolved   Supervision of high risk pregnancy, antepartum 06/17/2019 by Conard Novak, MD No   Overview Addendum 08/14/2019  8:37 AM by Farrel Conners, CNM    [facility] [place] Prenatal Labs  Dating 11wk1d [place] Blood type: A/Negative/-- (03/10 1543)   Genetic Screen 1 Screen: neg   AFP: declines    Antibody:Negative (03/10 1543)  Anatomic [place]  Rubella: 2.54 (03/10 1543) Varicella: Immune  GTT Early:  141, 3hr WNL             Third trimester:  RPR: Non Reactive (03/10 1543)   Rhogam  HBsAg: Negative (03/10 1543)   TDaP vaccine                       Flu Shot: HIV: Non Reactive (03/10 1543)   Baby Food                                GBS:   Contraception  Pap:  CBB     CS/VBAC    Support Person           Previous Version   Chronic hypertension affecting pregnancy 06/17/2019 by Conard Novak, MD No   Overview Addendum 08/25/2019  2:26 PM by Mirna Mires, CNM    [ ]  Aspirin 81 mg daily after 12 weeks; discontinue after 36 weeks [x baseline labs with CBC, CMP, urine protein/creatinine ratio [x ] no BP meds unless BPs become elevated-  on Labetalol [ ]  ultrasound for growth at 28, 32, 36 weeks [ ]  Aspirin 81 mg daily after 12 weeks; discontinue after 36 weeks. Started 5/18 [ ]  Baseline EKG   Current antihypertensives: 3/11 labetalol 200 mgm BID 3/25 labetalol increased to 300 mgm BID 08/25/19 dose increased to 400mg m BID  Baseline and surveillance labs (pulled in from [facility], refresh links as needed)  Lab Results  Component Value Date   PLT 384 06/17/2019   CREATININE 0.60 06/17/2019   AST 18 06/17/2019   ALT 18 06/17/2019    Antenatal Testing CHTN - O10.919  Group I  BP < 140/90, no preeclampsia, AGA,  nml AFV, +/- meds     Group II BP > 140/90, on meds, no preeclampsia, AGA, nml AFV  20-28-34-38  20-24-28-32-35-38  32//2 x wk  28//BPP wkly then 32//2 x wk  40 no meds; 39 meds  PRN or 37  Pre-eclampsia  GHTN - O13.9/Preeclampsia without severe features  - O14.00   Preeclampsia with severe features - O14.10  Q 3-4wks  Q 2 wks  28//BPP wkly then 32//2 x wk  Inpatient  37  PRN or 34        Previous Version   Obesity affecting pregnancy 06/17/2019 by 07-07-2004, MD No   BMI 39.0-39.9,adult 06/17/2019 by 08/17/2019, MD No   Nausea and vomiting during pregnancy 06/17/2019 by 12-22-1988, MD No       Preterm labor symptoms and general obstetric precautions including but not limited to vaginal bleeding, contractions, leaking of fluid and fetal movement were reviewed in detail with the patient. Please refer to After Visit Summary for other counseling recommendations.  We discussed the recommendation for IOL at 39 weeks. Shewill have a 3hr GTT nex t visit, and a growth scan, plus her Rhogam  Return in about 2 weeks (around 10/30/2019) for return OB and 3 hr GTT.  Conard Novak, CNM  10/16/2019 8:31 AM

## 2019-10-20 ENCOUNTER — Encounter: Payer: Self-pay | Admitting: Obstetrics and Gynecology

## 2019-10-20 ENCOUNTER — Other Ambulatory Visit: Payer: Self-pay

## 2019-10-20 ENCOUNTER — Observation Stay
Admission: EM | Admit: 2019-10-20 | Discharge: 2019-10-20 | Disposition: A | Discharge status: Home / Self Care | Payer: Federal, State, Local not specified - PPO | Attending: Obstetrics and Gynecology | Admitting: Obstetrics and Gynecology

## 2019-10-20 ENCOUNTER — Encounter: Payer: Federal, State, Local not specified - PPO | Admitting: Certified Nurse Midwife

## 2019-10-20 DIAGNOSIS — O219 Vomiting of pregnancy, unspecified: Secondary | ICD-10-CM

## 2019-10-20 DIAGNOSIS — B9689 Other specified bacterial agents as the cause of diseases classified elsewhere: Secondary | ICD-10-CM

## 2019-10-20 DIAGNOSIS — O10919 Unspecified pre-existing hypertension complicating pregnancy, unspecified trimester: Secondary | ICD-10-CM

## 2019-10-20 DIAGNOSIS — Z79899 Other long term (current) drug therapy: Secondary | ICD-10-CM | POA: Diagnosis not present

## 2019-10-20 DIAGNOSIS — R109 Unspecified abdominal pain: Secondary | ICD-10-CM | POA: Diagnosis not present

## 2019-10-20 DIAGNOSIS — Z6839 Body mass index (BMI) 39.0-39.9, adult: Secondary | ICD-10-CM

## 2019-10-20 DIAGNOSIS — O0990 Supervision of high risk pregnancy, unspecified, unspecified trimester: Secondary | ICD-10-CM

## 2019-10-20 DIAGNOSIS — Z3A26 26 weeks gestation of pregnancy: Secondary | ICD-10-CM | POA: Insufficient documentation

## 2019-10-20 DIAGNOSIS — O99211 Obesity complicating pregnancy, first trimester: Secondary | ICD-10-CM

## 2019-10-20 DIAGNOSIS — Z7982 Long term (current) use of aspirin: Secondary | ICD-10-CM | POA: Insufficient documentation

## 2019-10-20 DIAGNOSIS — O26892 Other specified pregnancy related conditions, second trimester: Secondary | ICD-10-CM | POA: Diagnosis not present

## 2019-10-20 LAB — URINALYSIS, COMPLETE (UACMP) WITH MICROSCOPIC
Bilirubin Urine: NEGATIVE
Glucose, UA: NEGATIVE mg/dL
Hgb urine dipstick: NEGATIVE
Ketones, ur: NEGATIVE mg/dL
Nitrite: NEGATIVE
Protein, ur: 30 mg/dL — AB
Specific Gravity, Urine: 1.02 (ref 1.005–1.030)
pH: 6 (ref 5.0–8.0)

## 2019-10-20 LAB — CHLAMYDIA/NGC RT PCR (ARMC ONLY)
Chlamydia Tr: NOT DETECTED
N gonorrhoeae: NOT DETECTED

## 2019-10-20 MED ORDER — METRONIDAZOLE 500 MG PO TABS
500.0000 mg | ORAL_TABLET | ORAL | Status: DC
  Filled 2019-10-20: qty 1

## 2019-10-20 MED ORDER — METRONIDAZOLE 500 MG PO TABS
500.0000 mg | ORAL_TABLET | ORAL | Status: AC
  Administered 2019-10-20: 500 mg via ORAL
  Filled 2019-10-20: qty 1

## 2019-10-20 MED ORDER — METRONIDAZOLE 500 MG PO TABS: 500.0000 mg | ORAL_TABLET | Freq: Once | ORAL | Status: DC

## 2019-10-20 MED ORDER — METRONIDAZOLE 500 MG PO TABS: 500.0000 mg | ORAL_TABLET | Freq: Two times a day (BID) | ORAL | 0 refills | Status: DC

## 2019-10-20 NOTE — Telephone Encounter (Signed)
Per CLG, pt aware to go to L&D as we cannot monitor ctx here.  Misty notified.

## 2019-10-20 NOTE — Progress Notes (Signed)
RN and MD reviewed lab results with patient and went over discharge instructions and medications to take at discharge. Patient verbalized understanding and states she has no questions.

## 2019-10-20 NOTE — ED Triage Notes (Signed)
Pt refused wheelchair.  

## 2019-10-20 NOTE — Progress Notes (Signed)
RN applied external fetal monitors at 1507 with FHTs at 145. Difficulty tracing FHR and ultrasound was adjusted regularly as RN was triaging patient. Patient reports positive fetal movement all day. MD aware of difficulty tracing and states RN can discontinue FHR monitoring.

## 2019-10-20 NOTE — Final Progress Note (Signed)
Physician Final Progress Note  Patient ID: Jenna Sims MRN: [id number] DOB/AGE: [birth year] 34 y.o.  Admit date: 10/20/2019 Admitting provider: Conard Novak, MD Discharge date: 10/20/2019   Admission Diagnoses:  1) intrauterine pregnancy at [redacted]w[redacted]d  2) abdominal pain in pregnancy, second trimester  Discharge Diagnoses:  1) intrauterine pregnancy at [redacted]w[redacted]d  2) abdominal pain in pregnancy, second trimester - no evidence of labor  History of Present Illness: The patient is a 34 y.o. female [phone] at [redacted]w[redacted]d who presents for lower abdominal cramping since yesterday. She describes the cramping as like a period starting. She also notes that she had some brown discharge that started today.  She describes that the discharge is like at the end of a period.  She has not had intercourse in a week. She denies frank vaginal bleeding. She notes +FM, no LOF, and no discrete contractions.  The pain radiates to her legs and back. Nothing makes the pain better or worse. She notes no other associated symptoms.   Past Medical History:  Diagnosis Date  . Abdominal pain   . Depression   . Heavy menstrual bleeding   . HSV-1 infection   . Hypertension   . Lower back pain     Past Surgical History:  Procedure Laterality Date  . DILATION AND CURETTAGE OF UTERUS    . LAPAROSCOPY N/A 11/18/2018   Procedure: LAPAROSCOPY DIAGNOSTIC;  Surgeon: Conard Novak, MD;  Location: [facility] ORS;  Service: Gynecology;  Laterality: N/A;  . WISDOM TOOTH EXTRACTION      No current facility-administered medications on file prior to encounter.   Current Outpatient Medications on File Prior to Encounter  Medication Sig Dispense Refill  . aspirin 81 MG chewable tablet Chew by mouth daily.    Marland Kitchen labetalol (NORMODYNE) 100 MG tablet Take by mouth.    . Prenatal Vit-Fe Fumarate-FA (MULTIVITAMIN-PRENATAL) 27-0.8 MG TABS tablet Take 1 tablet by mouth daily at 12 noon.    . promethazine (PHENERGAN) 25 MG tablet Take 1  tablet (25 mg total) by mouth every 6 (six) hours as needed for nausea or vomiting. 30 tablet 2    No Known Allergies  Social History   Socioeconomic History  . Marital status: Married    Spouse name: Not on file  . Number of children: Not on file  . Years of education: Not on file  . Highest education level: Not on file  Occupational History  . Not on file  Tobacco Use  . Smoking status: Never Smoker  . Smokeless tobacco: Never Used  Vaping Use  . Vaping Use: Never used  Substance and Sexual Activity  . Alcohol use: Not Currently    Alcohol/week: 1.0 standard drink    Types: 1 Glasses of wine per week  . Drug use: Never  . Sexual activity: Not Currently    Birth control/protection: None  Other Topics Concern  . Not on file  Social History Narrative  . Not on file   Social Determinants of Health   Financial Resource Strain:   . Difficulty of Paying Living Expenses:   Food Insecurity:   . Worried About Programme researcher, broadcasting/film/video in the Last Year:   . Barista in the Last Year:   Transportation Needs:   . Freight forwarder (Medical):   Marland Kitchen Lack of Transportation (Non-Medical):   Physical Activity:   . Days of Exercise per Week:   . Minutes of Exercise per Session:   Stress:   .  Feeling of Stress :   Social Connections:   . Frequency of Communication with Friends and Family:   . Frequency of Social Gatherings with Friends and Family:   . Attends Religious Services:   . Active Member of Clubs or Organizations:   . Attends Banker Meetings:   Marland Kitchen Marital Status:   Intimate Partner Violence:   . Fear of Current or Ex-Partner:   . Emotionally Abused:   Marland Kitchen Physically Abused:   . Sexually Abused:     Family History  Problem Relation Age of Onset  . Hypertension Mother   . Hypertension Father      Review of Systems  Constitutional: Negative.  Negative for chills and fever.  HENT: Negative.   Eyes: Negative.   Respiratory: Negative.    Cardiovascular: Negative.   Gastrointestinal: Positive for abdominal pain (see HPI). Negative for blood in stool, constipation, diarrhea, nausea and vomiting.  Genitourinary: Negative.  Negative for dysuria, flank pain, frequency, hematuria and urgency.  Musculoskeletal: Negative.   Skin: Negative.   Neurological: Negative.   Psychiatric/Behavioral: Negative.      Physical Exam: BP 139/70 (BP Location: Right Arm)   Pulse 81   Temp 98.1 F (36.7 C) (Oral)   Resp 20   Ht 5\' 1"  (1.549 m)   Wt 100.7 kg   LMP 04/20/2019   BMI 41.95 kg/m   Physical Exam Constitutional:      General: She is not in acute distress.    Appearance: Normal appearance. She is well-developed.  Genitourinary:     Pelvic exam was performed with patient in the lithotomy position.     Vulva, inguinal canal, urethra, bladder and vagina normal.     No posterior fourchette tenderness, injury or lesion present.     No cervical friability, lesion, bleeding or polyp.     Genitourinary Comments: CVX: closed/thick/high  HENT:     Head: Normocephalic and atraumatic.  Eyes:     General: No scleral icterus.    Conjunctiva/sclera: Conjunctivae normal.  Cardiovascular:     Rate and Rhythm: Normal rate and regular rhythm.     Heart sounds: No murmur heard.  No friction rub. No gallop.   Pulmonary:     Effort: Pulmonary effort is normal. No respiratory distress.     Breath sounds: Normal breath sounds. No wheezing or rales.  Abdominal:     General: Bowel sounds are normal. There is no distension.     Palpations: Abdomen is soft.     Tenderness: There is no abdominal tenderness. There is no guarding or rebound.     Comments: Gravid, NT  Musculoskeletal:        General: Normal range of motion.     Cervical back: Normal range of motion and neck supple.  Neurological:     General: No focal deficit present.     Mental Status: She is alert and oriented to person, place, and time.     Cranial Nerves: No cranial nerve  deficit.  Skin:    General: Skin is warm and dry.     Findings: No erythema.  Psychiatric:        Mood and Affect: Mood normal.        Behavior: Behavior normal.        Judgment: Judgment normal.   Female chaperone present for pelvic exam:   Consults: None  Significant Findings/ Diagnostic Studies:  Lab Results  Component Value Date   APPEARANCEUR HAZY (A) 10/20/2019   GLUCOSEU  NEGATIVE 10/20/2019   BILIRUBINUR NEGATIVE 10/20/2019   KETONESUR NEGATIVE 10/20/2019   LABSPEC 1.020 10/20/2019   HGBUR NEGATIVE 10/20/2019   PHURINE 6.0 10/20/2019   NITRITE NEGATIVE 10/20/2019   LEUKOCYTESUR SMALL (A) 10/20/2019   RBCU 0-5 10/20/2019   WBCU 21-50 10/20/2019   BACTERIA FEW (A) 10/20/2019   EPIU 0-5 10/20/2019   CAOXCRYSTALS PRESENT 10/20/2019    Lab Results  Component Value Date   CHLAMYDIA NOT DETECTED 10/20/2019   NGONORRHOEAE NOT DETECTED 10/20/2019    Lab Results  Component Value Date   TRICHWETPREP NONE SEEN 10/20/2019   CLUECESSL PRESENT (A) 10/20/2019   WBCWETPREP RARE (A) 10/20/2019   YEASTWETPREP NONE SEEN 10/20/2019     Procedures:  Fetal heart tones: normal  Hospital Course: The patient was admitted to Labor and Delivery Triage for observation. She was not in labor. Her vital signs were normal. The fetal heart rate assessment was reassuring.  She was not laboring. She was diagnosed with bacterial vaginosis and was given an Rx for treatment of this.   Discharge Condition: stable  Disposition: Discharge disposition: 01-Home or Self Care       Diet: Regular diet  Discharge Activity: Activity as tolerated   Allergies as of 10/20/2019   No Known Allergies     Medication List    TAKE these medications   aspirin 81 MG chewable tablet Chew by mouth daily.   labetalol 100 MG tablet Commonly known as: NORMODYNE Take by mouth.   metroNIDAZOLE 500 MG tablet Commonly known as: FLAGYL Take 1 tablet (500 mg total) by mouth 2 (two) times  daily. Start taking on: October 21, 2019   multivitamin-prenatal 27-0.8 MG Tabs tablet Take 1 tablet by mouth daily at 12 noon.   promethazine 25 MG tablet Commonly known as: PHENERGAN Take 1 tablet (25 mg total) by mouth every 6 (six) hours as needed for nausea or vomiting.        Total time spent taking care of this patient: 30 minutes  Signed: Thomasene Mohair, MD  10/20/2019, 6:19 PM

## 2019-10-20 NOTE — OB Triage Note (Signed)
Patient [id number] [redacted]w[redacted]d complains of abdominal cramping since yesterday. She states they have consistently been every 2-4 minutes apart since yesterday. She states that they are not currently as frequent, but she had noticed brown discharge this morning and early afternoon. Patient reports positive fetal movement and denies red vaginal bleeding, leaking of fluid, or any other complaints. Patient denies increase in urinary frequency/pain with urination and reports last intercourse as being a week ago. Monitors applied and assessing. Will continue to monitor.

## 2019-10-20 NOTE — Discharge Summary (Signed)
See Final Progress note 

## 2019-10-20 NOTE — Telephone Encounter (Signed)
Scheduled

## 2019-10-22 ENCOUNTER — Telehealth: Payer: Self-pay

## 2019-10-22 LAB — WET PREP, GENITAL
Clue Cells Wet Prep HPF POC: NONE SEEN
Sperm: NONE SEEN
Trich, Wet Prep: NONE SEEN
Yeast Wet Prep HPF POC: NONE SEEN

## 2019-10-22 LAB — URINE CULTURE
Culture: >=100000 — AB
Special Requests: NORMAL

## 2019-10-22 NOTE — Telephone Encounter (Signed)
Damaris Schooner from [facility] calling c report on SDJ's pt.  [phone]  Oneida Alar states specimen was randomly pulled to teach someone how to read slides; noticed original report states clue cells are present; NO clue cells were seen by Oneida Alar; will update report - should change in Epic without second report being sent.  She wanted to bring it to our attention in case med rx'd. Adv will let SDJ know.

## 2019-10-22 NOTE — Progress Notes (Signed)
Please call patient and let her know that she does have a UTI.  Once you let me know that she knows I will send in her prescription.  Also, apparently she does not have bacterial vaginosis and can stop taking metronidazole.  This was diagnosed the other night at the hospital.  Please let me know once you've contacted her.

## 2019-10-23 ENCOUNTER — Telehealth: Payer: Self-pay

## 2019-10-23 NOTE — Telephone Encounter (Signed)
-----   Message from Conard Novak, MD sent at 10/22/2019  4:17 PM EDT ----- Please call patient and let her know that she does have a UTI.  Once you let me know that she knows I will send in her prescription.  Also, apparently she does not have bacterial vaginosis and can stop taking metronidazole.  This was diagnosed the ot her night at the hospital.  Please let me know once you've contacted her.

## 2019-10-23 NOTE — Telephone Encounter (Signed)
Pt aware.

## 2019-10-26 ENCOUNTER — Other Ambulatory Visit: Payer: Self-pay | Admitting: Obstetrics and Gynecology

## 2019-10-26 DIAGNOSIS — O2342 Unspecified infection of urinary tract in pregnancy, second trimester: Secondary | ICD-10-CM

## 2019-10-26 DIAGNOSIS — O0990 Supervision of high risk pregnancy, unspecified, unspecified trimester: Secondary | ICD-10-CM

## 2019-10-26 MED ORDER — AMOXICILLIN 500 MG PO CAPS: 500.0000 mg | ORAL_CAPSULE | Freq: Three times a day (TID) | ORAL | 0 refills | Status: AC

## 2019-10-26 NOTE — Progress Notes (Signed)
Rx sent 

## 2019-10-28 NOTE — Telephone Encounter (Signed)
Rx sent 

## 2019-10-30 ENCOUNTER — Other Ambulatory Visit: Payer: Federal, State, Local not specified - PPO

## 2019-10-30 ENCOUNTER — Ambulatory Visit (INDEPENDENT_AMBULATORY_CARE_PROVIDER_SITE_OTHER): Payer: Federal, State, Local not specified - PPO

## 2019-10-30 ENCOUNTER — Ambulatory Visit (INDEPENDENT_AMBULATORY_CARE_PROVIDER_SITE_OTHER): Payer: Federal, State, Local not specified - PPO | Admitting: Certified Nurse Midwife

## 2019-10-30 ENCOUNTER — Other Ambulatory Visit: Payer: Self-pay

## 2019-10-30 VITALS — BP 140/90 | Wt 221.0 lb

## 2019-10-30 DIAGNOSIS — Z130 Encounter for screening for diseases of the blood and blood-forming organs and certain disorders involving the immune mechanism: Secondary | ICD-10-CM | POA: Diagnosis not present

## 2019-10-30 DIAGNOSIS — O10912 Unspecified pre-existing hypertension complicating pregnancy, second trimester: Secondary | ICD-10-CM | POA: Diagnosis not present

## 2019-10-30 DIAGNOSIS — O0990 Supervision of high risk pregnancy, unspecified, unspecified trimester: Secondary | ICD-10-CM

## 2019-10-30 DIAGNOSIS — Z6791 Unspecified blood type, Rh negative: Secondary | ICD-10-CM

## 2019-10-30 DIAGNOSIS — O99212 Obesity complicating pregnancy, second trimester: Secondary | ICD-10-CM | POA: Diagnosis not present

## 2019-10-30 DIAGNOSIS — O10913 Unspecified pre-existing hypertension complicating pregnancy, third trimester: Secondary | ICD-10-CM

## 2019-10-30 DIAGNOSIS — O99211 Obesity complicating pregnancy, first trimester: Secondary | ICD-10-CM

## 2019-10-30 DIAGNOSIS — O35HXX0 Maternal care for other (suspected) fetal abnormality and damage, fetal lower extremities anomalies, not applicable or unspecified: Secondary | ICD-10-CM

## 2019-10-30 DIAGNOSIS — O26893 Other specified pregnancy related conditions, third trimester: Secondary | ICD-10-CM

## 2019-10-30 DIAGNOSIS — Z3A28 28 weeks gestation of pregnancy: Secondary | ICD-10-CM

## 2019-10-30 DIAGNOSIS — O10919 Unspecified pre-existing hypertension complicating pregnancy, unspecified trimester: Secondary | ICD-10-CM

## 2019-10-30 DIAGNOSIS — O0992 Supervision of high risk pregnancy, unspecified, second trimester: Secondary | ICD-10-CM | POA: Diagnosis not present

## 2019-10-30 DIAGNOSIS — Z3A26 26 weeks gestation of pregnancy: Secondary | ICD-10-CM

## 2019-10-30 DIAGNOSIS — Z113 Encounter for screening for infections with a predominantly sexual mode of transmission: Secondary | ICD-10-CM | POA: Diagnosis not present

## 2019-10-30 DIAGNOSIS — O0993 Supervision of high risk pregnancy, unspecified, third trimester: Secondary | ICD-10-CM

## 2019-10-30 DIAGNOSIS — O358XX0 Maternal care for other (suspected) fetal abnormality and damage, not applicable or unspecified: Secondary | ICD-10-CM

## 2019-10-30 LAB — POCT URINALYSIS DIPSTICK OB

## 2019-10-30 MED ORDER — RHO D IMMUNE GLOBULIN 1500 UNIT/2ML IJ SOSY
300.0000 ug | PREFILLED_SYRINGE | Freq: Once | INTRAMUSCULAR | Status: AC
  Administered 2019-10-30: 300 ug via INTRAMUSCULAR

## 2019-10-30 NOTE — Progress Notes (Signed)
rhophylac given IM right glut.  NDC# [phone]

## 2019-10-30 NOTE — Progress Notes (Signed)
ROB at 28wk2d: Currently taking 400 mgm labetalol BID. Occasional headache, no visual changes, no RUQ pain or nausea/vomiting or CP/SOB. Baby active. Having repeat 3 hour GTT and other 28 week labs today. A negative blood type. Reports blood pressures at home have been running in the 140s/90s range over the last couple of weeks.  FH 38 cm, FHT 138. BP 140/90. Trace proteinuria, Weight stable (221#), with trace ankle edema  ULtrasound: EFW 2#2oz (968gm/ 26%) with AC 30.5%. FL <2.3%, C<5%. AFI WNL (16.8cm), breech  A: IUP at 28wk2d with chronic hypertension on labetalol with mild range blood pressure  Normal growth on ultrasound but short humerus and femur-patient is short-may be genetic, but will get MFM consult to evaluate to rule out skeletal dysgenesis RH negative  P: MFM consult for ultrasound/ evaluation and recommendations on starting antepartum testing RTO 1 week for blood pressure check Rhogam today  Farrel Conners, CNM

## 2019-10-31 LAB — GESTATIONAL GLUCOSE TOLERANCE
Glucose, Fasting: 68 mg/dL (ref 65–94)
Glucose, GTT - 1 Hour: 161 mg/dL (ref 65–179)
Glucose, GTT - 2 Hour: 141 mg/dL (ref 65–154)
Glucose, GTT - 3 Hour: 107 mg/dL (ref 65–139)

## 2019-10-31 LAB — CBC WITH DIFFERENTIAL/PLATELET
Basophils Absolute: 0.1 10*3/uL (ref 0.0–0.2)
Basos: 1 %
EOS (ABSOLUTE): 0.1 10*3/uL (ref 0.0–0.4)
Eos: 1 %
Hematocrit: 33.6 % — ABNORMAL LOW (ref 34.0–46.6)
Hemoglobin: 9.9 g/dL — ABNORMAL LOW (ref 11.1–15.9)
Immature Grans (Abs): 0.1 10*3/uL (ref 0.0–0.1)
Immature Granulocytes: 1 %
Lymphocytes Absolute: 3.2 10*3/uL — ABNORMAL HIGH (ref 0.7–3.1)
Lymphs: 27 %
MCH: 21.8 pg — ABNORMAL LOW (ref 26.6–33.0)
MCHC: 29.5 g/dL — ABNORMAL LOW (ref 31.5–35.7)
MCV: 74 fL — ABNORMAL LOW (ref 79–97)
Monocytes Absolute: 0.7 10*3/uL (ref 0.1–0.9)
Monocytes: 6 %
Neutrophils Absolute: 7.5 10*3/uL — ABNORMAL HIGH (ref 1.4–7.0)
Neutrophils: 64 %
Platelets: 354 10*3/uL (ref 150–450)
RBC: 4.54 x10E6/uL (ref 3.77–5.28)
RDW: 15.4 % (ref 11.7–15.4)
WBC: 11.6 10*3/uL — ABNORMAL HIGH (ref 3.4–10.8)

## 2019-10-31 LAB — HIV ANTIBODY (ROUTINE TESTING W REFLEX): HIV Screen 4th Generation wRfx: NONREACTIVE

## 2019-10-31 LAB — ANTIBODY SCREEN: Antibody Screen: NEGATIVE

## 2019-10-31 LAB — RPR QUALITATIVE: RPR Ser Ql: NONREACTIVE

## 2019-11-02 ENCOUNTER — Encounter: Payer: Self-pay | Admitting: Obstetrics

## 2019-11-03 ENCOUNTER — Other Ambulatory Visit: Payer: Self-pay | Admitting: Certified Nurse Midwife

## 2019-11-03 DIAGNOSIS — O10919 Unspecified pre-existing hypertension complicating pregnancy, unspecified trimester: Secondary | ICD-10-CM

## 2019-11-03 DIAGNOSIS — O35HXX0 Maternal care for other (suspected) fetal abnormality and damage, fetal lower extremities anomalies, not applicable or unspecified: Secondary | ICD-10-CM

## 2019-11-04 ENCOUNTER — Ambulatory Visit (INDEPENDENT_AMBULATORY_CARE_PROVIDER_SITE_OTHER): Payer: Federal, State, Local not specified - PPO | Admitting: Obstetrics & Gynecology

## 2019-11-04 ENCOUNTER — Other Ambulatory Visit: Payer: Self-pay

## 2019-11-04 ENCOUNTER — Encounter: Payer: Self-pay | Admitting: Obstetrics & Gynecology

## 2019-11-04 VITALS — BP 138/88 | Wt 224.0 lb

## 2019-11-04 DIAGNOSIS — O10919 Unspecified pre-existing hypertension complicating pregnancy, unspecified trimester: Secondary | ICD-10-CM

## 2019-11-04 DIAGNOSIS — O99213 Obesity complicating pregnancy, third trimester: Secondary | ICD-10-CM

## 2019-11-04 DIAGNOSIS — O0993 Supervision of high risk pregnancy, unspecified, third trimester: Secondary | ICD-10-CM

## 2019-11-04 DIAGNOSIS — Z3A29 29 weeks gestation of pregnancy: Secondary | ICD-10-CM

## 2019-11-04 NOTE — Progress Notes (Signed)
  Subjective  Fetal Movement? yes Contractions? no Leaking Fluid? no Vaginal Bleeding? no Denies ha, blurry vision, CP, SOB, epigastric pain.  Some edema, comes and goes. Objective  BP (!) 138/88   Wt (!) 224 lb (101.6 kg)   LMP 04/20/2019   BMI 42.32 kg/m  General: NAD Pumonary: no increased work of breathing Abdomen: gravid, non-tender Extremities: no edema Psychiatric: mood appropriate, affect full  Assessment  34 y.o. [id number] at [redacted]w[redacted]d by  01/20/2020, by Ultrasound presenting for routine prenatal visit  Plan   Problem List Items Addressed This Visit      Cardiovascular and Mediastinum   Chronic hypertension affecting pregnancy     Other   Supervision of high risk pregnancy, antepartum   Obesity affecting pregnancy in third trimester    Other Visit Diagnoses    [redacted] weeks gestation of pregnancy    -  Primary      pregnancy Problems (from 06/17/19 to present)    Problem Noted Resolved   Supervision of high risk pregnancy, antepartum 06/17/2019 by Jenna Novak, MD No   Overview Addendum 08/14/2019  8:37 AM by Farrel Conners, CNM    Clinic [facility] Prenatal Labs  Dating 11wk1d [place] Blood type: A/Negative/-- (03/10 1543)   Genetic Screen 1 Screen: neg   AFP: declines    Antibody:Negative (03/10 1543)  Anatomic [place] [facility] nml Rubella: 2.54 (03/10 1543) Varicella: Immune  GTT Early:  141, 3hr WNL             Third trimester:  RPR: Non Reactive (03/10 1543)   Rhogam 10/2019 HBsAg: Negative (03/10 1543)   TDaP vaccine              Flu Shot: HIV: Non Reactive (03/10 1543)   Baby Food  Breast GBS: p  Contraception Vasec planned Pap:pp  CBB  no   CS/VBAC n/a   Support Person           PNV, [facility]  [place] MFM next week for f/u   Labetalol 400 mg BID, continue at this time  Monitor for s/sx preeclampsia  Annamarie Major, MD, [facility], [facility] 11/04/2019  2:08 PM

## 2019-11-05 DIAGNOSIS — Z630 Problems in relationship with spouse or partner: Secondary | ICD-10-CM | POA: Diagnosis not present

## 2019-11-11 DIAGNOSIS — Z630 Problems in relationship with spouse or partner: Secondary | ICD-10-CM | POA: Diagnosis not present

## 2019-11-12 ENCOUNTER — Encounter: Payer: Self-pay | Admitting: Obstetrics and Gynecology

## 2019-11-12 ENCOUNTER — Ambulatory Visit (HOSPITAL_BASED_OUTPATIENT_CLINIC_OR_DEPARTMENT_OTHER): Payer: Federal, State, Local not specified - PPO

## 2019-11-12 ENCOUNTER — Ambulatory Visit (HOSPITAL_BASED_OUTPATIENT_CLINIC_OR_DEPARTMENT_OTHER): Payer: Federal, State, Local not specified - PPO | Admitting: Maternal & Fetal Medicine

## 2019-11-12 ENCOUNTER — Observation Stay
Admission: RE | Admit: 2019-11-12 | Discharge: 2019-11-12 | Disposition: A | Discharge status: Home / Self Care | Payer: Federal, State, Local not specified - PPO | Attending: Obstetrics and Gynecology | Admitting: Obstetrics and Gynecology

## 2019-11-12 ENCOUNTER — Other Ambulatory Visit: Payer: Self-pay

## 2019-11-12 DIAGNOSIS — O99343 Other mental disorders complicating pregnancy, third trimester: Secondary | ICD-10-CM | POA: Insufficient documentation

## 2019-11-12 DIAGNOSIS — O358XX0 Maternal care for other (suspected) fetal abnormality and damage, not applicable or unspecified: Secondary | ICD-10-CM | POA: Diagnosis not present

## 2019-11-12 DIAGNOSIS — Z3A30 30 weeks gestation of pregnancy: Secondary | ICD-10-CM | POA: Insufficient documentation

## 2019-11-12 DIAGNOSIS — F329 Major depressive disorder, single episode, unspecified: Secondary | ICD-10-CM | POA: Insufficient documentation

## 2019-11-12 DIAGNOSIS — O99213 Obesity complicating pregnancy, third trimester: Secondary | ICD-10-CM

## 2019-11-12 DIAGNOSIS — O219 Vomiting of pregnancy, unspecified: Secondary | ICD-10-CM

## 2019-11-12 DIAGNOSIS — O35HXX0 Maternal care for other (suspected) fetal abnormality and damage, fetal lower extremities anomalies, not applicable or unspecified: Secondary | ICD-10-CM

## 2019-11-12 DIAGNOSIS — O0993 Supervision of high risk pregnancy, unspecified, third trimester: Secondary | ICD-10-CM | POA: Insufficient documentation

## 2019-11-12 DIAGNOSIS — O10013 Pre-existing essential hypertension complicating pregnancy, third trimester: Principal | ICD-10-CM | POA: Insufficient documentation

## 2019-11-12 DIAGNOSIS — O10919 Unspecified pre-existing hypertension complicating pregnancy, unspecified trimester: Secondary | ICD-10-CM

## 2019-11-12 DIAGNOSIS — O10913 Unspecified pre-existing hypertension complicating pregnancy, third trimester: Secondary | ICD-10-CM | POA: Diagnosis not present

## 2019-11-12 DIAGNOSIS — Z6839 Body mass index (BMI) 39.0-39.9, adult: Secondary | ICD-10-CM

## 2019-11-12 DIAGNOSIS — O0990 Supervision of high risk pregnancy, unspecified, unspecified trimester: Secondary | ICD-10-CM

## 2019-11-12 LAB — COMPREHENSIVE METABOLIC PANEL
ALT: 11 U/L (ref 0–44)
AST: 16 U/L (ref 15–41)
Albumin: 2.8 g/dL — ABNORMAL LOW (ref 3.5–5.0)
Alkaline Phosphatase: 83 U/L (ref 38–126)
Anion gap: 9 (ref 5–15)
BUN: <5 mg/dL — ABNORMAL LOW (ref 6–20)
CO2: 22 mmol/L (ref 22–32)
Calcium: 8.3 mg/dL — ABNORMAL LOW (ref 8.9–10.3)
Chloride: 104 mmol/L (ref 98–111)
Creatinine, Ser: 0.4 mg/dL — ABNORMAL LOW (ref 0.44–1.00)
GFR calc Af Amer: >60 mL/min (ref >60)
GFR calc non Af Amer: >60 mL/min (ref >60)
Glucose, Bld: 89 mg/dL (ref 70–99)
Potassium: 3.3 mmol/L — ABNORMAL LOW (ref 3.5–5.1)
Sodium: 135 mmol/L (ref 135–145)
Total Bilirubin: 0.7 mg/dL (ref 0.3–1.2)
Total Protein: 6.6 g/dL (ref 6.5–8.1)

## 2019-11-12 LAB — CBC
HCT: 31.9 % — ABNORMAL LOW (ref 36.0–46.0)
Hemoglobin: 10.1 g/dL — ABNORMAL LOW (ref 12.0–15.0)
MCH: 22.1 pg — ABNORMAL LOW (ref 26.0–34.0)
MCHC: 31.7 g/dL (ref 30.0–36.0)
MCV: 69.7 fL — ABNORMAL LOW (ref 80.0–100.0)
Platelets: 354 10*3/uL (ref 150–400)
RBC: 4.58 MIL/uL (ref 3.87–5.11)
RDW: 16.1 % — ABNORMAL HIGH (ref 11.5–15.5)
WBC: 13.1 10*3/uL — ABNORMAL HIGH (ref 4.0–10.5)
nRBC: 0 % (ref 0.0–0.2)

## 2019-11-12 LAB — PROTEIN / CREATININE RATIO, URINE
Creatinine, Urine: 140 mg/dL
Protein Creatinine Ratio: 0.13 mg/mg{Cre} (ref 0.00–0.15)
Total Protein, Urine: 18 mg/dL

## 2019-11-12 MED ORDER — NIFEDIPINE 10 MG PO CAPS: 10.0000 mg | ORAL_CAPSULE | Freq: Once | ORAL | Status: AC

## 2019-11-12 MED ORDER — NIFEDIPINE ER OSMOTIC RELEASE 30 MG PO TB24: 60.0000 mg | ORAL_TABLET | ORAL | Status: AC

## 2019-11-12 MED ORDER — NIFEDIPINE ER OSMOTIC RELEASE 60 MG PO TB24
60.0000 mg | ORAL_TABLET | Freq: Every day | ORAL | 4 refills | Status: DC
Start: 2019-11-12 — End: 2020-01-01

## 2019-11-12 MED ORDER — ACETAMINOPHEN 500 MG PO TABS
ORAL_TABLET | ORAL | Status: AC
  Administered 2019-11-12: 1000 mg via ORAL
  Filled 2019-11-12: qty 2

## 2019-11-12 MED ORDER — NIFEDIPINE ER OSMOTIC RELEASE 30 MG PO TB24
ORAL_TABLET | ORAL | Status: AC
  Administered 2019-11-12: 60 mg via ORAL
  Filled 2019-11-12: qty 2

## 2019-11-12 MED ORDER — ACETAMINOPHEN 500 MG PO TABS: 1000.0000 mg | ORAL_TABLET | Freq: Four times a day (QID) | ORAL | Status: DC | PRN

## 2019-11-12 MED ORDER — NIFEDIPINE 10 MG PO CAPS
ORAL_CAPSULE | ORAL | Status: AC
  Administered 2019-11-12: 10 mg via ORAL
  Filled 2019-11-12: qty 1

## 2019-11-12 NOTE — Final Progress Note (Signed)
Physician Final Progress Note  Patient ID: Jenna Sims MRN: [id number] DOB/AGE: 1985-07-11 34 y.o.  Admit date: 11/12/2019 Admitting provider: [name], CNM Discharge date: 11/12/2019   Admission Diagnoses:  1) intrauterine pregnancy at [redacted]w[redacted]d  2) chronic hypertension affecting pregnancy, third trimester, exacerbation  Discharge Diagnoses:  Active Problems:   Supervision of high risk pregnancy, antepartum   Chronic hypertension affecting pregnancy  No evidence of superimposed preeclampsia  History of Present Illness: The patient is a 34 y.o. female [id number] at [redacted]w[redacted]d who presents from MFM clinic with severe range blood pressure.  She has had a moderate headache since yesterday. Denies visual changes and scotomata.  She denies RUQ and epigastric pain.  she notes +FM. She had an otherwise reassuring fetal evaluation at the MFM clinic.    Past Medical History:  Diagnosis Date  . Abdominal pain   . Depression   . Heavy menstrual bleeding   . HSV-1 infection   . Hypertension   . Lower back pain     Past Surgical History:  Procedure Laterality Date  . DILATION AND CURETTAGE OF UTERUS    . LAPAROSCOPY N/A 11/18/2018   Procedure: LAPAROSCOPY DIAGNOSTIC;  Surgeon: Conard Novak, MD;  Location: [facility] ORS;  Service: Gynecology;  Laterality: N/A;  . WISDOM TOOTH EXTRACTION      No current facility-administered medications on file prior to encounter.   Current Outpatient Medications on File Prior to Encounter  Medication Sig Dispense Refill  . aspirin 81 MG chewable tablet Chew by mouth daily.    Marland Kitchen labetalol (NORMODYNE) 100 MG tablet Take 400 mg by mouth 2 (two) times daily.     . Prenatal Vit-Fe Fumarate-FA (MULTIVITAMIN-PRENATAL) 27-0.8 MG TABS tablet Take 1 tablet by mouth daily at 12 noon.    . promethazine (PHENERGAN) 25 MG tablet Take 1 tablet (25 mg total) by mouth every 6 (six) hours as needed for nausea or vomiting. 30 tablet 2    No Known Allergies  Social  History   Socioeconomic History  . Marital status: Married    Spouse name: Not on file  . Number of children: Not on file  . Years of education: Not on file  . Highest education level: Not on file  Occupational History  . Not on file  Tobacco Use  . Smoking status: Never Smoker  . Smokeless tobacco: Never Used  Vaping Use  . Vaping Use: Never used  Substance and Sexual Activity  . Alcohol use: Not Currently    Alcohol/week: 1.0 standard drink    Types: 1 Glasses of wine per week  . Drug use: Never  . Sexual activity: Not Currently    Birth control/protection: None  Other Topics Concern  . Not on file  Social History Narrative  . Not on file   Social Determinants of Health   Financial Resource Strain:   . Difficulty of Paying Living Expenses:   Food Insecurity:   . Worried About Programme researcher, broadcasting/film/video in the Last Year:   . Barista in the Last Year:   Transportation Needs:   . Freight forwarder (Medical):   Marland Kitchen Lack of Transportation (Non-Medical):   Physical Activity:   . Days of Exercise per Week:   . Minutes of Exercise per Session:   Stress:   . Feeling of Stress :   Social Connections:   . Frequency of Communication with Friends and Family:   . Frequency of Social Gatherings with Friends and Family:   .  Attends Religious Services:   . Active Member of Clubs or Organizations:   . Attends Banker Meetings:   Marland Kitchen Marital Status:   Intimate Partner Violence:   . Fear of Current or Ex-Partner:   . Emotionally Abused:   Marland Kitchen Physically Abused:   . Sexually Abused:     Family History  Problem Relation Age of Onset  . Hypertension Mother   . Hypertension Father      Review of Systems  Constitutional: Negative.   HENT: Negative.   Eyes: Negative.  Negative for blurred vision and double vision.  Respiratory: Negative.   Cardiovascular: Negative.   Gastrointestinal: Negative.  Negative for abdominal pain.  Genitourinary: Negative.    Musculoskeletal: Negative.   Skin: Negative.   Neurological: Positive for headaches. Negative for dizziness, tingling, tremors, sensory change, speech change, focal weakness, seizures, loss of consciousness and weakness.  Psychiatric/Behavioral: Negative.      Physical Exam: BP 136/76 (BP Location: Right Arm)   Pulse 97   Resp 18   LMP 04/20/2019   Physical Exam Constitutional:      General: She is not in acute distress.    Appearance: Normal appearance. She is well-developed.  HENT:     Head: Normocephalic and atraumatic.  Eyes:     General: No scleral icterus.    Conjunctiva/sclera: Conjunctivae normal.  Cardiovascular:     Rate and Rhythm: Normal rate and regular rhythm.     Heart sounds: No murmur heard.  No friction rub. No gallop.   Pulmonary:     Effort: Pulmonary effort is normal. No respiratory distress.     Breath sounds: Normal breath sounds. No wheezing or rales.  Abdominal:     General: Bowel sounds are normal. There is no distension.     Palpations: Abdomen is soft. There is no mass.     Tenderness: There is no abdominal tenderness. There is no guarding or rebound.  Musculoskeletal:        General: Normal range of motion.     Cervical back: Normal range of motion and neck supple.  Neurological:     General: No focal deficit present.     Mental Status: She is alert and oriented to person, place, and time.     Cranial Nerves: No cranial nerve deficit.  Skin:    General: Skin is warm and dry.     Findings: No erythema.  Psychiatric:        Mood and Affect: Mood normal.        Behavior: Behavior normal.        Judgment: Judgment normal.    Consults: None  Significant Findings/ Diagnostic Studies:  Lab Results  Component Value Date   WBC 13.1 (H) 11/12/2019   HGB 10.1 (L) 11/12/2019   HCT 31.9 (L) 11/12/2019   PLT 354 11/12/2019   CREATININE 0.40 (L) 11/12/2019   ALT 11 11/12/2019   AST 16 11/12/2019   PROTCRRATIO 0.13 11/12/2019      Procedures: NST Baseline FHR: 135 beats/min Variability: moderate Accelerations: present Decelerations: absent Tocometry: quiet  Interpretation:  INDICATIONS: chronic hypertension RESULTS:  A NST procedure was performed with FHR monitoring and a normal baseline established, appropriate time of 20-40 minutes of evaluation, and accels >2 seen w 15x15 characteristics.  Results show a REACTIVE NST.    Hospital Course: The patient was admitted to Labor and Delivery Triage for observation. She never had severe-range blood pressure on L&D. She was initially given nifedipine 10 mg  PO by the CNM.  She was later given Procardia 60 mg XL po x 1.  Her blood pressures decreased and her headache resolved.  She had a reactive fetal tracing.  She was discharged with strict instructions to return should her headache return and be unable to resolved.  She also was encouraged to return should she develop visual changes and RUQ/epigastric pain and any concerning symptoms.   Discharge Condition: stable  Disposition: Discharge disposition: 01-Home or Self Care       Diet: Regular diet  Discharge Activity: Activity as tolerated   Allergies as of 11/12/2019   No Known Allergies     Medication List    TAKE these medications   aspirin 81 MG chewable tablet Chew by mouth daily.   labetalol 100 MG tablet Commonly known as: NORMODYNE Take 400 mg by mouth 2 (two) times daily.   multivitamin-prenatal 27-0.8 MG Tabs tablet Take 1 tablet by mouth daily at 12 noon.   NIFEdipine 60 MG 24 hr tablet Commonly known as: Procardia XL Take 1 tablet (60 mg total) by mouth daily.   promethazine 25 MG tablet Commonly known as: PHENERGAN Take 1 tablet (25 mg total) by mouth every 6 (six) hours as needed for nausea or vomiting.       Follow-up Information    [facility]. Schedule an appointment as soon as possible for a visit on 11/16/2019.   Specialty: Obstetrics and Gynecology Why: BP  check Contact information: [address] [place] [address] [phone]              Total time spent taking care of this patient: 45 minutes  Signed: Thomasene Mohair, MD  11/12/2019, 5:54 PM

## 2019-11-12 NOTE — Progress Notes (Signed)
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  The patient reports that her blood pressures have been elevated ever since her last pregnancy about 7 years ago.  She was treated with lisinopril and hydrochlorothiazide for management of her blood pressures outside of pregnancy.  She is taking a daily baby aspirin for preeclampsia prophylaxis.  She denies any other significant past medical history and denies any problems in her current pregnancy.  The patient's blood pressures in our office today was 154/106 and 161/107.  She reports that she has felt a slight headache over the past 2 days.  She reports feeling vigorous fetal movements throughout the day.  The patient reports that she has not had a screening test drawn for detection of fetal aneuploidy in her current pregnancy.  On today's ultrasound exam, the overall EFW measures at the 2nd percentile for her gestational age.  The lower fetal weight is primarily due to shortened long bones (humerus and femur lengths).  There was normal amniotic fluid noted today.  An echogenic focus was noted in the left ventricle of the fetal heart.  The small association between an echogenic focus and Down syndrome was discussed today.  Doppler studies of the umbilical arteries performed today showed a normal S/D ratio of 2.38.  There were no signs of absent or reversed end-diastolic flow noted today.  Frontal bossing is also noted on the views of the fetal profile.  The patient was advised that due to the shortened long bones and the frontal bossing noted on today's exam, there is a high likelihood that her baby has achondroplasia.  She was advised that achondroplasia is the most common form of dwarfism.  It is usually inherited in an autosomal dominant fashion.  Most cases of achondroplasia result from new  mutations. Therefore, there may not be a family history of dwarfism.  People with achondroplasia generally live a normal life and will probably have normal intelligence.  Treatments for achondroplasia may include support groups and possibly growth hormones.  As achondroplasia is caused by a mutation in the fibroblast growth factor receptor 3 (FGFR3) gene, prenatal diagnosis via an amniocentesis is possible.  The patient was offered an amniocentesis today.  As prenatal diagnosis would not change the outcome or management of her pregnancy, she will discuss the option for an amniocentesis with her husband.  Our genetic counselor will contact the patient soon to schedule a genetic counseling session.  The patient was advised that although unlikely, as the umbilical arteries Doppler studies continue to show normal forward flow, fetal growth restriction possibly due to uncontrolled blood pressures has not been ruled out.  We will continue to follow her with weekly umbilical artery Doppler studies and weekly fetal testing to assess the fetal status.  Due to the severe range blood pressures noted today, the patient was sent to labor and delivery immediately following today's ultrasound exam for evaluation.  She should have a P/C ratio assessed, PIH labs, and an NST.  Should her blood pressures remain extremely elevated, consideration should be given to adding Procardia 60 mg XL daily to her antihypertensive medication regimen.  The dosage of Procardia may have to be increased to 90 mg daily should her blood pressures continue to be uncontrolled.  Preeclampsia precautions were reviewed with the patient today.  Should she be diagnosed with preeclampsia, delivery  may have to occur at between 34 to 37 weeks depending on her blood pressure control.  Should she require a preterm delivery, a course of antenatal corticosteroids should be administered.  A follow-up exam was scheduled in our office in 1 week for fetal  assessment.  A total of 30 minutes was spent counseling and coordinating the care for this patient.  This note was copied from an original document that was generated using the AS ultrasound reporting software.

## 2019-11-12 NOTE — Discharge Summary (Signed)
See Final Progress note 

## 2019-11-16 ENCOUNTER — Ambulatory Visit (INDEPENDENT_AMBULATORY_CARE_PROVIDER_SITE_OTHER): Payer: Federal, State, Local not specified - PPO | Admitting: Obstetrics & Gynecology

## 2019-11-16 ENCOUNTER — Other Ambulatory Visit: Payer: Self-pay

## 2019-11-16 ENCOUNTER — Other Ambulatory Visit: Payer: Self-pay | Admitting: Certified Nurse Midwife

## 2019-11-16 ENCOUNTER — Encounter: Payer: Self-pay | Admitting: Obstetrics & Gynecology

## 2019-11-16 VITALS — BP 148/98 | Wt 213.0 lb

## 2019-11-16 DIAGNOSIS — I10 Essential (primary) hypertension: Secondary | ICD-10-CM

## 2019-11-16 DIAGNOSIS — O0993 Supervision of high risk pregnancy, unspecified, third trimester: Secondary | ICD-10-CM

## 2019-11-16 DIAGNOSIS — O365990 Maternal care for other known or suspected poor fetal growth, unspecified trimester, not applicable or unspecified: Secondary | ICD-10-CM

## 2019-11-16 DIAGNOSIS — O99213 Obesity complicating pregnancy, third trimester: Secondary | ICD-10-CM

## 2019-11-16 DIAGNOSIS — Z3A30 30 weeks gestation of pregnancy: Secondary | ICD-10-CM

## 2019-11-16 DIAGNOSIS — O10919 Unspecified pre-existing hypertension complicating pregnancy, unspecified trimester: Secondary | ICD-10-CM

## 2019-11-16 DIAGNOSIS — O10913 Unspecified pre-existing hypertension complicating pregnancy, third trimester: Secondary | ICD-10-CM

## 2019-11-16 LAB — POCT URINALYSIS DIPSTICK OB
Glucose, UA: NEGATIVE
POC,PROTEIN,UA: NEGATIVE

## 2019-11-16 NOTE — Addendum Note (Signed)
Addended by: Cornelius Moras D on: 11/16/2019 09:17 AM   Modules accepted: Orders

## 2019-11-16 NOTE — Patient Instructions (Signed)

## 2019-11-16 NOTE — Progress Notes (Signed)
Prenatal Visit Note Date: 11/16/2019 Clinic: [facility]  Subjective:  BUFFY EHLER is a 34 y.o. [id number] at [redacted]w[redacted]d being seen today for ongoing prenatal care.  She is currently monitored for the following issues for this high-risk pregnancy and has Menorrhagia with irregular cycle; Adenomyosis; Chronic pelvic pain in female; Supervision of high risk pregnancy, antepartum; Chronic hypertension affecting pregnancy; Obesity affecting pregnancy in third trimester; BMI 39.0-39.9,adult; Nausea and vomiting during pregnancy; Essential hypertension in pregnancy; Genital herpes; Hypertension; Postpartum depression; Rh negative state in antepartum period, third trimester; Vitamin D deficiency; and Labor and delivery, indication for care on their problem list.  Patient reports mild headache that comes and goes.  Denies blurry vision, CP, SOB, worsening edema, epigastric pain.  .   Contractions: Not present. Vag. Bleeding: None.  Movement: Present. Denies leaking of fluid.   The following portions of the patient's history were reviewed and updated as appropriate: allergies, current medications, past family history, past medical history, past social history, past surgical history and problem list. Problem list updated.  Objective:   Vitals:   11/16/19 0835  BP: (!) 148/98  Weight: 213 lb (96.6 kg)    Fetal Status:     Movement: Present     General:  Alert, oriented and cooperative. Patient is in no acute distress.  Skin: Skin is warm and dry. No rash noted.   Cardiovascular: Normal heart rate noted  Respiratory: Normal respiratory effort, no problems with respiration noted  Abdomen: Soft, gravid, appropriate for gestational age. Pain/Pressure: Absent     Pelvic:  Cervical exam deferred        Extremities: Normal range of motion.     Mental Status: Normal mood and affect. Normal behavior. Normal judgment and thought content.   Urinalysis:      Assessment and Plan:  Pregnancy: [id number] at  [redacted]w[redacted]d  1. Supervision of high risk pregnancy in third trimester PNV, FMC Keep weekly MFM appt's  2. [redacted] weeks gestation of pregnancy  3. Chronic hypertension affecting pregnancy Cont current Labetalol and Procardia regimen, and change activity to modified bed rest with work from home (or out of work if not allowed by employer to work from home).  4. Obesity affecting pregnancy in third trimester APT, Growth [place], ASA  Preterm labor symptoms and general obstetric precautions including but not limited to vaginal bleeding, contractions, leaking of fluid and fetal movement were reviewed in detail with the patient. Please refer to After Visit Summary for other counseling recommendations.    Problem Noted Resolved   Supervision of high risk pregnancy, antepartum 06/17/2019 by Conard Novak, MD No   Overview Addendum 08/14/2019  8:37 AM by Farrel Conners, CNM    Clinic [facility] Prenatal Labs  Dating 11wk1d [place] Blood type: A/Negative/-- (03/10 1543)   Genetic Screen 1 Screen: neg   AFP: declines    Antibody:Negative (03/10 1543)  Anatomic [place]  Rubella: 2.54 (03/10 1543) Varicella: Immune  GTT Early:  141, 3hr WNL             Third trimester:  RPR: Non Reactive (03/10 1543)   Rhogam  HBsAg: Negative (03/10 1543)   TDaP vaccine                       Flu Shot: HIV: Non Reactive (03/10 1543)   Baby Food  GBS:   Contraception  Pap:  CBB     CS/VBAC    Support Person             Return in about 1 week (around 11/23/2019) for HROB.  Annamarie Major, MD, [facility], [facility] 11/16/2019  9:11 AM

## 2019-11-18 ENCOUNTER — Encounter: Payer: Self-pay | Admitting: Certified Nurse Midwife

## 2019-11-18 DIAGNOSIS — O365910 Maternal care for other known or suspected poor fetal growth, first trimester, not applicable or unspecified: Secondary | ICD-10-CM | POA: Insufficient documentation

## 2019-11-19 ENCOUNTER — Ambulatory Visit: Payer: Federal, State, Local not specified - PPO | Attending: Obstetrics

## 2019-11-19 ENCOUNTER — Other Ambulatory Visit: Payer: Self-pay | Admitting: Certified Nurse Midwife

## 2019-11-19 ENCOUNTER — Other Ambulatory Visit: Payer: Federal, State, Local not specified - PPO

## 2019-11-19 ENCOUNTER — Other Ambulatory Visit: Payer: Self-pay

## 2019-11-19 ENCOUNTER — Ambulatory Visit (HOSPITAL_BASED_OUTPATIENT_CLINIC_OR_DEPARTMENT_OTHER): Payer: Federal, State, Local not specified - PPO

## 2019-11-19 DIAGNOSIS — O99213 Obesity complicating pregnancy, third trimester: Secondary | ICD-10-CM

## 2019-11-19 DIAGNOSIS — O365910 Maternal care for other known or suspected poor fetal growth, first trimester, not applicable or unspecified: Secondary | ICD-10-CM

## 2019-11-19 DIAGNOSIS — I10 Essential (primary) hypertension: Secondary | ICD-10-CM

## 2019-11-19 DIAGNOSIS — Z3A31 31 weeks gestation of pregnancy: Secondary | ICD-10-CM | POA: Diagnosis not present

## 2019-11-19 DIAGNOSIS — Z6839 Body mass index (BMI) 39.0-39.9, adult: Secondary | ICD-10-CM

## 2019-11-19 DIAGNOSIS — O163 Unspecified maternal hypertension, third trimester: Secondary | ICD-10-CM | POA: Insufficient documentation

## 2019-11-19 DIAGNOSIS — Z630 Problems in relationship with spouse or partner: Secondary | ICD-10-CM | POA: Diagnosis not present

## 2019-11-19 DIAGNOSIS — O0990 Supervision of high risk pregnancy, unspecified, unspecified trimester: Secondary | ICD-10-CM

## 2019-11-19 DIAGNOSIS — O10919 Unspecified pre-existing hypertension complicating pregnancy, unspecified trimester: Secondary | ICD-10-CM

## 2019-11-19 DIAGNOSIS — O219 Vomiting of pregnancy, unspecified: Secondary | ICD-10-CM

## 2019-11-19 DIAGNOSIS — IMO0002 Reserved for concepts with insufficient information to code with codable children: Secondary | ICD-10-CM

## 2019-11-19 DIAGNOSIS — O365931 Maternal care for other known or suspected poor fetal growth, third trimester, fetus 1: Secondary | ICD-10-CM

## 2019-11-19 DIAGNOSIS — O365990 Maternal care for other known or suspected poor fetal growth, unspecified trimester, not applicable or unspecified: Secondary | ICD-10-CM

## 2019-11-19 DIAGNOSIS — O365930 Maternal care for other known or suspected poor fetal growth, third trimester, not applicable or unspecified: Secondary | ICD-10-CM | POA: Insufficient documentation

## 2019-11-19 DIAGNOSIS — O169 Unspecified maternal hypertension, unspecified trimester: Secondary | ICD-10-CM

## 2019-11-19 DIAGNOSIS — O358XX0 Maternal care for other (suspected) fetal abnormality and damage, not applicable or unspecified: Secondary | ICD-10-CM | POA: Diagnosis not present

## 2019-11-19 DIAGNOSIS — O99210 Obesity complicating pregnancy, unspecified trimester: Secondary | ICD-10-CM | POA: Diagnosis not present

## 2019-11-19 DIAGNOSIS — E669 Obesity, unspecified: Secondary | ICD-10-CM | POA: Diagnosis not present

## 2019-11-19 NOTE — Progress Notes (Signed)
Referring provider: [facility] Length of consultation: 30 minutes  Jenna Sims was referred for genetic counseling at [facility] at [facility] due to the finding of short long bones on ultrasound.   Jenna Sims anatomy ultrasound performed on 11/12/2019 at [redacted] weeks gestation identified shortened long bones. Specifically, the humerus and femur were <5th percentile for this gestational age.  In addition, frontal bossing appeared likely given the prominent forehead on profile images.  Additionally, the other long bones and the chest did not appear smaller that expected. The remainder of the fetal anatomy was seen and appeared normal, though it is important to know that this cannot rule out other anomalies or growth differences.  We discussed possible conditions that could be associated with shortened long bones. First, it is possible that this is a manifestation of growth restriction in an otherwise healthy fetus.  Second, shortened long bones can be associated with various skeletal dysplasias. Skeletal dysplasias are a group of heritable disorders characterized by abnormalities of the bone and cartilage. There are more than 450 disorders that are classified as skeletal dysplasias.  The genes responsible for these conditions are known in many, but not all, cases. Various patterns of inheritance are associated with skeletal dysplasias, including autosomal recessive, autosomal dominant, and X-linked inheritance. Additionally, some skeletal dysplasias are associated with imprinting disorders and teratogenic exposures. Many are the result of a new genetic change in that one individual within a family. Clinical expression and recurrence risks are highly dependent on the underlying cause of the skeletal dysplasia.   In general, skeletal dysplasias account for 5% of all genetic disorders seen in the neonatal period. Every 1 in 5000 livebirths and 10 in 5000 stillbirths are affected by a skeletal  dysplasia. Achondroplasia is the most common skeletal dysplasia, however it is often not diagnosed prenatally. Approximately 2/3 (66%) of prenatally-diagnosed skeletal dysplasias are attributed to campomelic dysplasia, thanatophoric dysplasia, or osteogenesis imperfecta (OI). Prognosis depends on the specific type of skeletal dysplasia that is identified. Regardless of diagnosis, approximately 50% of infants with skeletal dysplasia are stillborn or die within the first 6 weeks of life.   Given the ultrasound findings in this pregnancy, with evidence only of short humerus and femur along with frontal bossing, it is felt most likely that if this pregnancy were to have a skeletal dysplasia, we would expect it to be more mild (not a neonatal lethal form) with Achondroplasia or a similar condition being most likely. Achondroplasia is the result of a change in the FGFR3 gene. In approximately 80% of cases, this condition occurs as a de novo (or new change) in the affected child, with both parents being of typical stature. Individuals with Achondroplasia often present in newborn or infant period with rhizomelic shortening of the limbs, macrocephaly, and characteristic facial features with frontal bossing and midface retrusion. Most individuals with this condition have normal intelligence and lifespan, though many babies will have low muscle tone which may result in delayed motor milestones. Other complications, such as craniocervical junction compression, obstructive sleep apnea, middle ear dysfunction, kyphosis, and spinal stenosis can also be concerning for individuals with Achondroplasia. We spoke about the possibility of single gene noninvasive prenatal screening (NIPS) for single gene disorders through a test called Vistara. Vistara can screen for several fetal skeletal dysplasias, including Achondroplasia, though there is limited data on the sensitivity and specificity of this testing.   Another cause for  shortened long bones can be chromosomal abnormalities. Jenna Sims had first trimester screening performed  at [facility] including biochemistry and nuchal translucency measurement.  These results showed a <1 in 10,000 chance for Down syndrome as well as Trisomy 18. Jenna Sims understands that while these results are reassuring, first trimester screening is limited in that it does not test for all chromosomal or genetic conditions.   Finally, we discussed that it is not always possible to determine the cause of the fetus's shortened long bones prenatally. The pregnancy will continue to be followed via ultrasound to assess for any other findings that may be indicative of a particular syndrome. However, Jenna Sims understands that screening tests, including ultrasound, cannot rule out all birth defects or genetic syndromes.    We discussed that our understanding of expected prognosis for the current fetus is limited to what can be identified on ultrasound. There are several additional screening/testing options available to get more information about a possible diagnosis for the fetus, which may help to better inform anticipated prognosis. Testing options include: . Follow-up ultrasounds in Maternal Fetal Medicine to follow growth of the baby. . Diagnostic testing via amniocentesis was offered. We discussed the technical aspects of the amniocentesis procedure and quoted up to a 1 in 500 (0.2%) risk for spontaneous pregnancy loss or other adverse pregnancy outcomes as a result of the procedure. Cultured cells from an amniocentesis sample allow for the visualization of a fetal karyotype, which can detect >99% of chromosomal aberrations. Chromosomal microarray can also be performed to identify smaller deletions or duplications of fetal chromosomal material. Testing for numerous skeletal dysplasias can also be included in this testing.  [facility] NIPS for single gene disorders including FGFR3 variants using a maternal  blood sample.  Knowledge of the accuracy of this testing is still evolving. . Evaluation and testing as appropriate after delivery is a reasonable option as well.  This would allow for a formal physical evaluation from a Medical Geneticist with testing ordered based upon specific features identified at that time.  We also reviewed the pregnancy history and family history. This is the fourth pregnancy for Jenna Sims.  She has a 23 year old son from a prior relationship.  He has been diagnosed with autism and is doing well in an adaptive classroom.  She stated that she has not had a formal genetics evaluation or genetic testing to determine a cause for his autism.  We discussed that there may be various causes for autism including inherited syndromes and multifactorial conditions.  The chance for recurrence in half siblings is expected to be increased.  Of note, Jenna Sims had negative carrier screening in this pregnancy for CF, SMA and Fragile X.  Her repeat sizes were 29 and 30, so this pregnancy is not expected to be at risk for Fragile X syndrome. She reported no other family members with developmental differences, short stature, birth defects, recurrent pregnancy loss or known genetic conditions.  After careful consideration, Jenna Sims elected to decline Vistara testing and amniocentesis.  She and her husband are comfortable waiting until delivery for additional diagnostic testing.  The patient was encouraged to contact our clinic with any questions and concerns.  We may be reached at [phone].   Wilburt Finlay, MS, CGC

## 2019-11-20 ENCOUNTER — Encounter: Payer: Self-pay | Admitting: Obstetrics and Gynecology

## 2019-11-23 ENCOUNTER — Encounter: Payer: Self-pay | Admitting: Obstetrics and Gynecology

## 2019-11-23 ENCOUNTER — Ambulatory Visit (INDEPENDENT_AMBULATORY_CARE_PROVIDER_SITE_OTHER): Payer: Federal, State, Local not specified - PPO | Admitting: Obstetrics and Gynecology

## 2019-11-23 ENCOUNTER — Other Ambulatory Visit: Payer: Self-pay

## 2019-11-23 VITALS — BP 112/70 | Ht 64.0 in | Wt 218.4 lb

## 2019-11-23 DIAGNOSIS — Z3A31 31 weeks gestation of pregnancy: Secondary | ICD-10-CM

## 2019-11-23 DIAGNOSIS — O99213 Obesity complicating pregnancy, third trimester: Secondary | ICD-10-CM

## 2019-11-23 DIAGNOSIS — O365910 Maternal care for other known or suspected poor fetal growth, first trimester, not applicable or unspecified: Secondary | ICD-10-CM

## 2019-11-23 DIAGNOSIS — O358XX0 Maternal care for other (suspected) fetal abnormality and damage, not applicable or unspecified: Secondary | ICD-10-CM

## 2019-11-23 DIAGNOSIS — O0993 Supervision of high risk pregnancy, unspecified, third trimester: Secondary | ICD-10-CM

## 2019-11-23 DIAGNOSIS — O10919 Unspecified pre-existing hypertension complicating pregnancy, unspecified trimester: Secondary | ICD-10-CM

## 2019-11-23 DIAGNOSIS — O10913 Unspecified pre-existing hypertension complicating pregnancy, third trimester: Secondary | ICD-10-CM

## 2019-11-23 DIAGNOSIS — O0990 Supervision of high risk pregnancy, unspecified, unspecified trimester: Secondary | ICD-10-CM

## 2019-11-23 LAB — POCT URINALYSIS DIPSTICK
Bilirubin, UA: POSITIVE
Blood, UA: NEGATIVE
Glucose, UA: NEGATIVE
Ketones, UA: POSITIVE
Leukocytes, UA: NEGATIVE
Nitrite, UA: NEGATIVE
Protein, UA: POSITIVE — AB
Spec Grav, UA: >=1.03 — AB (ref 1.010–1.025)
Urobilinogen, UA: NEGATIVE E.U./dL — AB
pH, UA: 5 (ref 5.0–8.0)

## 2019-11-23 NOTE — Patient Instructions (Signed)
Third Trimester of Pregnancy The third trimester is from week 28 through week 40 (months 7 through 9). The third trimester is a time when the unborn baby (fetus) is growing rapidly. At the end of the ninth month, the fetus is about 20 inches in length and weighs 6-10 pounds. Body changes during your third trimester Your body will continue to go through many changes during pregnancy. The changes vary from woman to woman. During the third trimester:  Your weight will continue to increase. You can expect to gain 25-35 pounds (11-16 kg) by the end of the pregnancy.  You may begin to get stretch marks on your hips, abdomen, and breasts.  You may urinate more often because the fetus is moving lower into your pelvis and pressing on your bladder.  You may develop or continue to have heartburn. This is caused by increased hormones that slow down muscles in the digestive tract.  You may develop or continue to have constipation because increased hormones slow digestion and cause the muscles that push waste through your intestines to relax.  You may develop hemorrhoids. These are swollen veins (varicose veins) in the rectum that can itch or be painful.  You may develop swollen, bulging veins (varicose veins) in your legs.  You may have increased body aches in the pelvis, back, or thighs. This is due to weight gain and increased hormones that are relaxing your joints.  You may have changes in your hair. These can include thickening of your hair, rapid growth, and changes in texture. Some women also have hair loss during or after pregnancy, or hair that feels dry or thin. Your hair will most likely return to normal after your baby is born.  Your breasts will continue to grow and they will continue to become tender. A yellow fluid (colostrum) may leak from your breasts. This is the first milk you are producing for your baby.  Your belly button may stick out.  You may notice more swelling in your hands,  face, or ankles.  You may have increased tingling or numbness in your hands, arms, and legs. The skin on your belly may also feel numb.  You may feel short of breath because of your expanding uterus.  You may have more problems sleeping. This can be caused by the size of your belly, increased need to urinate, and an increase in your body's metabolism.  You may notice the fetus "dropping," or moving lower in your abdomen (lightening).  You may have increased vaginal discharge.  You may notice your joints feel loose and you may have pain around your pelvic bone. What to expect at prenatal visits You will have prenatal exams every 2 weeks until week 36. Then you will have weekly prenatal exams. During a routine prenatal visit:  You will be weighed to make sure you and the baby are growing normally.  Your blood pressure will be taken.  Your abdomen will be measured to track your baby's growth.  The fetal heartbeat will be listened to.  Any test results from the previous visit will be discussed.  You may have a cervical check near your due date to see if your cervix has softened or thinned (effaced).  You will be tested for Group B streptococcus. This happens between 35 and 37 weeks. Your health care provider may ask you:  What your birth plan is.  How you are feeling.  If you are feeling the baby move.  If you have had any abnormal   symptoms, such as leaking fluid, bleeding, severe headaches, or abdominal cramping.  If you are using any tobacco products, including cigarettes, chewing tobacco, and electronic cigarettes.  If you have any questions. Other tests or screenings that may be performed during your third trimester include:  Blood tests that check for low iron levels (anemia).  Fetal testing to check the health, activity level, and growth of the fetus. Testing is done if you have certain medical conditions or if there are problems during the pregnancy.  Nonstress test  (NST). This test checks the health of your baby to make sure there are no signs of problems, such as the baby not getting enough oxygen. During this test, a belt is placed around your belly. The baby is made to move, and its heart rate is monitored during movement. What is false labor? False labor is a condition in which you feel small, irregular tightenings of the muscles in the womb (contractions) that usually go away with rest, changing position, or drinking water. These are called Braxton Hicks contractions. Contractions may last for hours, days, or even weeks before true labor sets in. If contractions come at regular intervals, become more frequent, increase in intensity, or become painful, you should see your health care provider. What are the signs of labor?  Abdominal cramps.  Regular contractions that start at 10 minutes apart and become stronger and more frequent with time.  Contractions that start on the top of the uterus and spread down to the lower abdomen and back.  Increased pelvic pressure and dull back pain.  A watery or bloody mucus discharge that comes from the vagina.  Leaking of amniotic fluid. This is also known as your "water breaking." It could be a slow trickle or a gush. Let your health care provider know if it has a color or strange odor. If you have any of these signs, call your health care provider right away, even if it is before your due date. Follow these instructions at home: Medicines  Follow your health care provider's instructions regarding medicine use. Specific medicines may be either safe or unsafe to take during pregnancy.  Take a prenatal vitamin that contains at least 600 micrograms (mcg) of folic acid.  If you develop constipation, try taking a stool softener if your health care provider approves. Eating and drinking   Eat a balanced diet that includes fresh fruits and vegetables, whole grains, good sources of protein such as meat, eggs, or tofu,  and low-fat dairy. Your health care provider will help you determine the amount of weight gain that is right for you.  Avoid raw meat and uncooked cheese. These carry germs that can cause birth defects in the baby.  If you have low calcium intake from food, talk to your health care provider about whether you should take a daily calcium supplement.  Eat four or five small meals rather than three large meals a day.  Limit foods that are high in fat and processed sugars, such as fried and sweet foods.  To prevent constipation: ? Drink enough fluid to keep your urine clear or pale yellow. ? Eat foods that are high in fiber, such as fresh fruits and vegetables, whole grains, and beans. Activity  Exercise only as directed by your health care provider. Most women can continue their usual exercise routine during pregnancy. Try to exercise for 30 minutes at least 5 days a week. Stop exercising if you experience uterine contractions.  Avoid heavy lifting.  Do   not exercise in extreme heat or humidity, or at high altitudes.  Wear low-heel, comfortable shoes.  Practice good posture.  You may continue to have sex unless your health care provider tells you otherwise. Relieving pain and discomfort  Take frequent breaks and rest with your legs elevated if you have leg cramps or low back pain.  Take warm sitz baths to soothe any pain or discomfort caused by hemorrhoids. Use hemorrhoid cream if your health care provider approves.  Wear a good support bra to prevent discomfort from breast tenderness.  If you develop varicose veins: ? Wear support pantyhose or compression stockings as told by your healthcare provider. ? Elevate your feet for 15 minutes, 3-4 times a day. Prenatal care  Write down your questions. Take them to your prenatal visits.  Keep all your prenatal visits as told by your health care provider. This is important. Safety  Wear your seat belt at all times when driving.  Make  a list of emergency phone numbers, including numbers for family, friends, the hospital, and police and fire departments. General instructions  Avoid cat litter boxes and soil used by cats. These carry germs that can cause birth defects in the baby. If you have a cat, ask someone to clean the litter box for you.  Do not travel far distances unless it is absolutely necessary and only with the approval of your health care provider.  Do not use hot tubs, steam rooms, or saunas.  Do not drink alcohol.  Do not use any products that contain nicotine or tobacco, such as cigarettes and e-cigarettes. If you need help quitting, ask your health care provider.  Do not use any medicinal herbs or unprescribed drugs. These chemicals affect the formation and growth of the baby.  Do not douche or use tampons or scented sanitary pads.  Do not cross your legs for long periods of time.  To prepare for the arrival of your baby: ? Take prenatal classes to understand, practice, and ask questions about labor and delivery. ? Make a trial run to the hospital. ? Visit the hospital and tour the maternity area. ? Arrange for maternity or paternity leave through employers. ? Arrange for family and friends to take care of pets while you are in the hospital. ? Purchase a rear-facing car seat and make sure you know how to install it in your car. ? Pack your hospital bag. ? Prepare the baby's nursery. Make sure to remove all pillows and stuffed animals from the baby's crib to prevent suffocation.  Visit your dentist if you have not gone during your pregnancy. Use a soft toothbrush to brush your teeth and be gentle when you floss. Contact a health care provider if:  You are unsure if you are in labor or if your water has broken.  You become dizzy.  You have mild pelvic cramps, pelvic pressure, or nagging pain in your abdominal area.  You have lower back pain.  You have persistent nausea, vomiting, or  diarrhea.  You have an unusual or bad smelling vaginal discharge.  You have pain when you urinate. Get help right away if:  Your water breaks before 37 weeks.  You have regular contractions less than 5 minutes apart before 37 weeks.  You have a fever.  You are leaking fluid from your vagina.  You have spotting or bleeding from your vagina.  You have severe abdominal pain or cramping.  You have rapid weight loss or weight gain.  You have   shortness of breath with chest pain.  You notice sudden or extreme swelling of your face, hands, ankles, feet, or legs.  Your baby makes fewer than 10 movements in 2 hours.  You have severe headaches that do not go away when you take medicine.  You have vision changes. Summary  The third trimester is from week 28 through week 40, months 7 through 9. The third trimester is a time when the unborn baby (fetus) is growing rapidly.  During the third trimester, your discomfort may increase as you and your baby continue to gain weight. You may have abdominal, leg, and back pain, sleeping problems, and an increased need to urinate.  During the third trimester your breasts will keep growing and they will continue to become tender. A yellow fluid (colostrum) may leak from your breasts. This is the first milk you are producing for your baby.  False labor is a condition in which you feel small, irregular tightenings of the muscles in the womb (contractions) that eventually go away. These are called Braxton Hicks contractions. Contractions may last for hours, days, or even weeks before true labor sets in.  Signs of labor can include: abdominal cramps; regular contractions that start at 10 minutes apart and become stronger and more frequent with time; watery or bloody mucus discharge that comes from the vagina; increased pelvic pressure and dull back pain; and leaking of amniotic fluid. This information is not intended to replace advice given to you by your  health care provider. Make sure you discuss any questions you have with your health care provider. Document Revised: 07/17/2018 Document Reviewed: 05/01/2016 Elsevier Patient Education  2020 [employer]  

## 2019-11-23 NOTE — Progress Notes (Signed)
Routine Prenatal Care Visit  Subjective  Jenna Sims is a 34 y.o. [phone] at [redacted]w[redacted]d being seen today for ongoing prenatal care.  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  ----------------------------------------------------------------------------------- Patient reports no complaints.   Contractions: Not present. Vag. Bleeding: None.  Movement: Present. Denies leaking of fluid.  ----------------------------------------------------------------------------------- The following portions of the patient's history were reviewed and updated as appropriate: allergies, current medications, past family history, past medical history, past social history, past surgical history and problem list. Problem list updated.   Objective  Blood pressure 112/70, height 5\' 4"  (1.626 m), weight 218 lb 6.4 oz (99.1 kg), last menstrual period 04/20/2019, unknown if currently breastfeeding. Pregravid weight 210 lb (95.3 kg) Total Weight Gain 8 lb 6.4 oz (3.81 kg) Urinalysis:      Fetal Status:     Movement: Present     General:  Alert, oriented and cooperative. Patient is in no acute distress.  Skin: Skin is warm and dry. No rash noted.   Cardiovascular: Normal heart rate noted  Respiratory: Normal respiratory effort, no problems with respiration noted  Abdomen: Soft, gravid, appropriate for  gestational age. Pain/Pressure: Absent     Pelvic:  Cervical exam deferred        Extremities: Normal range of motion.     Mental Status: Normal mood and affect. Normal behavior. Normal judgment and thought content.     Assessment   34 y.o. 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 R/O FGR-followed weekly with Doppler studies and antepartum testing      Previous Version   Supervision of high risk pregnancy, antepartum 06/17/2019 by 08/17/2019, MD No   Overview Addendum 08/14/2019  8:37 AM by 10/14/2019, CNM    Clinic [facility] Prenatal Labs  Dating 11wk1d Farrel Conners Blood type: A/Negative/-- (03/10 1543)   Genetic Screen 1 Screen: neg   AFP: declines    Antibody:Negative (03/10 1543)  Anatomic 08-01-2005  Rubella: 2.54 (03/10 1543) Varicella: Immune  GTT Early:  141, 3hr WNL             Third trimester:  RPR: Non Reactive (03/10 1543)   Rhogam  HBsAg: Negative (03/10 1543)   TDaP vaccine                       Flu Shot: HIV: Non Reactive (03/10 1543)   Baby Food                                GBS:   Contraception  Pap:  CBB  CS/VBAC    Support Person           Previous Version   Chronic hypertension affecting pregnancy 06/17/2019 by Conard Novak, MD No   Overview Addendum 08/25/2019  2:26 PM by Mirna Mires, CNM    [ ]  Aspirin 81 mg daily after 12 weeks; discontinue after 36 weeks [x baseline labs with CBC, CMP, urine protein/creatinine ratio [x ] no BP meds unless BPs become elevated- on Labetalol [ ]  ultrasound for growth at 28, 32, 36 weeks [ ]  Aspirin 81 mg daily after 12  weeks; discontinue after 36 weeks. Started 5/18 [ ]  Baseline EKG   Current antihypertensives: 3/11 labetalol 200 mgm BID 3/25 labetalol increased to 300 mgm BID 08/25/19 dose increased to 400mg m BID  Baseline and surveillance labs (pulled in from [facility], refresh links as needed)  Lab Results  Component Value Date   PLT 384 06/17/2019   CREATININE 0.60 06/17/2019   AST 18 06/17/2019   ALT 18 06/17/2019    Antenatal Testing CHTN - O10.919  Group I  BP < 140/90, no preeclampsia, AGA,  nml AFV, +/- meds    Group II BP > 140/90, on meds, no preeclampsia, AGA, nml AFV  20-28-34-38  20-24-28-32-35-38  32//2 x wk  28//BPP wkly then 32//2 x wk  40 no meds; 39 meds  PRN or 37  Pre-eclampsia  GHTN - O13.9/Preeclampsia without severe features  - O14.00   Preeclampsia with severe features - O14.10  Q 3-4wks  Q 2 wks  28//BPP wkly then 32//2 x wk  Inpatient  37  PRN or 34        Previous Version   Obesity affecting pregnancy in third trimester 06/17/2019 by 08/17/2019, MD No   BMI 39.0-39.9,adult 06/17/2019 by 01-[birth year]-1989, MD No   Nausea and vomiting during pregnancy 06/17/2019 by Conard Novak, MD No       Encouraged patient to bring her log of home BP to the office. Reports she has had some lower BPs at home.  NST reactive. Continue twice a week monitoring.   Gestational age appropriate obstetric precautions including but not limited to vaginal bleeding, contractions, leaking of fluid and fetal movement were reviewed in detail with the patient.    Return in about 1 week (around 11/30/2019) for ROB in person with MD . NST.  08/17/2019 MD [facility], [facility] 11/23/2019, 8:47 PM

## 2019-11-26 ENCOUNTER — Other Ambulatory Visit: Payer: Self-pay

## 2019-11-26 ENCOUNTER — Ambulatory Visit: Payer: Federal, State, Local not specified - PPO | Attending: Maternal & Fetal Medicine

## 2019-11-26 DIAGNOSIS — Q774 Achondroplasia: Secondary | ICD-10-CM | POA: Diagnosis not present

## 2019-11-26 DIAGNOSIS — E669 Obesity, unspecified: Secondary | ICD-10-CM | POA: Insufficient documentation

## 2019-11-26 DIAGNOSIS — IMO0002 Reserved for concepts with insufficient information to code with codable children: Secondary | ICD-10-CM

## 2019-11-26 DIAGNOSIS — Z6839 Body mass index (BMI) 39.0-39.9, adult: Secondary | ICD-10-CM

## 2019-11-26 DIAGNOSIS — O365930 Maternal care for other known or suspected poor fetal growth, third trimester, not applicable or unspecified: Secondary | ICD-10-CM | POA: Insufficient documentation

## 2019-11-26 DIAGNOSIS — O358XX0 Maternal care for other (suspected) fetal abnormality and damage, not applicable or unspecified: Secondary | ICD-10-CM

## 2019-11-26 DIAGNOSIS — Z364 Encounter for antenatal screening for fetal growth retardation: Secondary | ICD-10-CM | POA: Diagnosis not present

## 2019-11-26 DIAGNOSIS — Z3A32 32 weeks gestation of pregnancy: Secondary | ICD-10-CM | POA: Diagnosis not present

## 2019-11-26 DIAGNOSIS — O169 Unspecified maternal hypertension, unspecified trimester: Secondary | ICD-10-CM

## 2019-11-26 DIAGNOSIS — O99213 Obesity complicating pregnancy, third trimester: Secondary | ICD-10-CM | POA: Diagnosis not present

## 2019-11-26 DIAGNOSIS — O0990 Supervision of high risk pregnancy, unspecified, unspecified trimester: Secondary | ICD-10-CM

## 2019-11-26 DIAGNOSIS — O10919 Unspecified pre-existing hypertension complicating pregnancy, unspecified trimester: Secondary | ICD-10-CM

## 2019-11-26 DIAGNOSIS — O219 Vomiting of pregnancy, unspecified: Secondary | ICD-10-CM

## 2019-11-26 DIAGNOSIS — O365910 Maternal care for other known or suspected poor fetal growth, first trimester, not applicable or unspecified: Secondary | ICD-10-CM

## 2019-11-26 DIAGNOSIS — O163 Unspecified maternal hypertension, third trimester: Secondary | ICD-10-CM | POA: Diagnosis not present

## 2019-11-30 ENCOUNTER — Other Ambulatory Visit: Payer: Self-pay

## 2019-11-30 ENCOUNTER — Other Ambulatory Visit: Payer: Self-pay | Admitting: Certified Nurse Midwife

## 2019-11-30 ENCOUNTER — Ambulatory Visit (INDEPENDENT_AMBULATORY_CARE_PROVIDER_SITE_OTHER): Payer: Federal, State, Local not specified - PPO | Admitting: Obstetrics and Gynecology

## 2019-11-30 VITALS — BP 142/90 | Wt 222.0 lb

## 2019-11-30 DIAGNOSIS — O99213 Obesity complicating pregnancy, third trimester: Secondary | ICD-10-CM

## 2019-11-30 DIAGNOSIS — Z3A32 32 weeks gestation of pregnancy: Secondary | ICD-10-CM

## 2019-11-30 DIAGNOSIS — O358XX0 Maternal care for other (suspected) fetal abnormality and damage, not applicable or unspecified: Secondary | ICD-10-CM

## 2019-11-30 DIAGNOSIS — O99013 Anemia complicating pregnancy, third trimester: Secondary | ICD-10-CM

## 2019-11-30 DIAGNOSIS — O0990 Supervision of high risk pregnancy, unspecified, unspecified trimester: Secondary | ICD-10-CM | POA: Diagnosis not present

## 2019-11-30 DIAGNOSIS — O35GXX0 Maternal care for other (suspected) fetal abnormality and damage, fetal upper extremities anomalies, not applicable or unspecified: Secondary | ICD-10-CM

## 2019-11-30 DIAGNOSIS — Z6791 Unspecified blood type, Rh negative: Secondary | ICD-10-CM

## 2019-11-30 DIAGNOSIS — O10919 Unspecified pre-existing hypertension complicating pregnancy, unspecified trimester: Secondary | ICD-10-CM

## 2019-11-30 DIAGNOSIS — O26893 Other specified pregnancy related conditions, third trimester: Secondary | ICD-10-CM

## 2019-11-30 DIAGNOSIS — O365910 Maternal care for other known or suspected poor fetal growth, first trimester, not applicable or unspecified: Secondary | ICD-10-CM

## 2019-11-30 DIAGNOSIS — O35HXX0 Maternal care for other (suspected) fetal abnormality and damage, fetal lower extremities anomalies, not applicable or unspecified: Secondary | ICD-10-CM

## 2019-11-30 DIAGNOSIS — Z23 Encounter for immunization: Secondary | ICD-10-CM | POA: Diagnosis not present

## 2019-11-30 DIAGNOSIS — O0993 Supervision of high risk pregnancy, unspecified, third trimester: Secondary | ICD-10-CM

## 2019-11-30 DIAGNOSIS — O10913 Unspecified pre-existing hypertension complicating pregnancy, third trimester: Secondary | ICD-10-CM

## 2019-11-30 NOTE — Progress Notes (Signed)
Routine Prenatal Care Visit  Subjective  Jenna Sims is a 34 y.o. [phone] at [redacted]w[redacted]d being seen today for ongoing prenatal care.  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  ----------------------------------------------------------------------------------- Patient reports no complaints.   Contractions: Not present.  .  Movement: Present. Denies leaking of fluid.  ----------------------------------------------------------------------------------- The following portions of the patient's history were reviewed and updated as appropriate: allergies, current medications, past family history, past medical history, past social history, past surgical history and problem list. Problem list updated.   Objective  Blood pressure (!) 142/90, weight 222 lb (100.7 kg), last menstrual period 04/20/2019, unknown if currently breastfeeding. Pregravid weight 210 lb (95.3 kg) Total Weight Gain 12 lb (5.443 kg) Urinalysis:      Fetal Status:     Movement: Present     General:  Alert, oriented and cooperative. Patient is in no acute distress.  Skin: Skin is warm and dry. No rash noted.   Cardiovascular: Normal heart rate noted  Respiratory: Normal respiratory effort, no problems with respiration noted  Abdomen: Soft, gravid, appropriate for gestational age. Pain/Pressure: Absent       Pelvic:  Cervical exam deferred        Extremities: Normal range of motion.     ental Status: Normal mood and affect. Normal behavior. Normal judgment and thought content.   Baseline: 130 Variability: moderate Accelerations: present Decelerations: absent Tocometry: N/A The patient was monitored for 30 minutes, fetal heart rate tracing was deemed reactive, category I tracing,  CPT 25956   Immunization History  Administered Date(s) Administered  . Influenza,inj,Quad PF,6+ Mos 02/11/2017  . Influenza-Unspecified 01/08/2018  . Rho (D) Immune Globulin 09/22/2012  . Tdap 04/01/2009, 08/25/2012, 11/14/2015, 11/30/2019     Assessment   33 y.o. 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 R/O FGR-followed weekly with Doppler studies and antepartum testing      Previous Version   Supervision of high risk pregnancy, antepartum 06/17/2019 by Conard Novak, MD No   Overview Addendum 08/14/2019  8:37 AM by Farrel Conners, CNM    Clinic [facility] Prenatal Labs  Dating 11wk1d [place] Blood type: A/Negative/-- (03/10 1543)   Genetic Screen 1 Screen: neg   AFP: declines    Antibody:Negative (03/10 1543)  Anatomic [place] Normal Female 09/04/2019 Subsequent growth scan short long bones consistent with skeletal dysplasia such as achondroplasia  Rubella: 2.54 (03/10 1543) Varicella: Immune  GTT Early:  141, 3hr WNL             Third trimester:  RPR: Non Reactive (03/10 1543)   Rhogam 10/30/2019 HBsAg: Negative (03/10 1543)   TDaP vaccine 11/30/2019  Flu  Shot: HIV: Non Reactive (03/10 1543)   Baby Food                                 GBS:   Contraception  Pap:01/08/2018 NILM   CBB     CS/VBAC    Support Person Laron (spouse)          Previous Version   Chronic hypertension affecting pregnancy 06/17/2019 by Conard Novak, MD No   Overview Addendum 08/25/2019  2:26 PM by Mirna Mires, CNM    [ ]  Aspirin 81 mg daily after 12 weeks; discontinue after 36 weeks [x baseline labs with CBC, CMP, urine protein/creatinine ratio [x ] no BP meds unless BPs become elevated- on Labetalol [ ]  ultrasound for growth at 28, 32, 36 weeks [ ]  Aspirin 81 mg daily after 12 weeks; discontinue after 36 weeks. Started 5/18 [ ]  Baseline EKG   Current antihypertensives: 3/11 labetalol 200 mgm BID 3/25 labetalol increased to 300 mgm BID 08/25/19 dose increased to 400mg m BID  Baseline and surveillance labs (pulled in from [facility], refresh links as needed)  Lab Results  Component Value Date   PLT 384 06/17/2019   CREATININE 0.60 06/17/2019   AST 18 06/17/2019   ALT 18 06/17/2019    Antenatal Testing CHTN - O10.919  Group I  BP < 140/90, no preeclampsia, AGA,  nml AFV, +/- meds    Group II BP > 140/90, on meds, no preeclampsia, AGA, nml AFV  20-28-34-38  20-24-28-32-35-38  32//2 x wk  28//BPP wkly then 32//2 x wk  40 no meds; 39 meds  PRN or 37  Pre-eclampsia  GHTN - O13.9/Preeclampsia without severe features  - O14.00   Preeclampsia with severe features - O14.10  Q 3-4wks  Q 2 wks  28//BPP wkly then 32//2 x wk  Inpatient  37  PRN or 34        Previous Version   Obesity affecting pregnancy in third trimester 06/17/2019 by 08/17/2019, MD No   BMI 39.0-39.9,adult 06/17/2019 by 01-05-1989, MD No   Nausea and vomiting during pregnancy 06/17/2019 by Conard Novak, MD No       Gestational age appropriate obstetric precautions including but not limited to vaginal bleeding, contractions, leaking of fluid and fetal movement were reviewed in detail with the patient.    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  Discussed hematology referral if needed for iron infusion  3) TDAP  - administered today  4) Problem list updated  Return in about 3 days (around 12/03/2019) for ROB NST in 7 days.  Conard Novak, MD, 08/17/2019 [facility], [facility] 11/30/2019, 2:06 PM

## 2019-11-30 NOTE — Progress Notes (Signed)
ROB NST 

## 2019-12-01 ENCOUNTER — Other Ambulatory Visit: Payer: Self-pay | Admitting: Obstetrics and Gynecology

## 2019-12-01 DIAGNOSIS — O99013 Anemia complicating pregnancy, third trimester: Secondary | ICD-10-CM

## 2019-12-01 LAB — CBC
Hematocrit: 31.8 % — ABNORMAL LOW (ref 34.0–46.6)
Hemoglobin: 9.4 g/dL — ABNORMAL LOW (ref 11.1–15.9)
MCH: 21.2 pg — ABNORMAL LOW (ref 26.6–33.0)
MCHC: 29.6 g/dL — ABNORMAL LOW (ref 31.5–35.7)
MCV: 72 fL — ABNORMAL LOW (ref 79–97)
Platelets: 339 10*3/uL (ref 150–450)
RBC: 4.43 x10E6/uL (ref 3.77–5.28)
RDW: 15.8 % — ABNORMAL HIGH (ref 11.7–15.4)
WBC: 11.4 10*3/uL — ABNORMAL HIGH (ref 3.4–10.8)

## 2019-12-01 NOTE — Progress Notes (Signed)
Referral to hematology for consideration of iron infusions prior to delivery

## 2019-12-02 DIAGNOSIS — Z630 Problems in relationship with spouse or partner: Secondary | ICD-10-CM | POA: Diagnosis not present

## 2019-12-03 ENCOUNTER — Other Ambulatory Visit: Payer: Self-pay | Admitting: Obstetrics & Gynecology

## 2019-12-03 ENCOUNTER — Ambulatory Visit: Payer: Federal, State, Local not specified - PPO | Attending: Obstetrics

## 2019-12-03 ENCOUNTER — Other Ambulatory Visit: Payer: Self-pay

## 2019-12-03 ENCOUNTER — Other Ambulatory Visit: Payer: Federal, State, Local not specified - PPO

## 2019-12-03 ENCOUNTER — Other Ambulatory Visit: Payer: Self-pay | Admitting: Certified Nurse Midwife

## 2019-12-03 DIAGNOSIS — O35GXX0 Maternal care for other (suspected) fetal abnormality and damage, fetal upper extremities anomalies, not applicable or unspecified: Secondary | ICD-10-CM

## 2019-12-03 DIAGNOSIS — O99213 Obesity complicating pregnancy, third trimester: Secondary | ICD-10-CM | POA: Diagnosis not present

## 2019-12-03 DIAGNOSIS — O358XX0 Maternal care for other (suspected) fetal abnormality and damage, not applicable or unspecified: Secondary | ICD-10-CM | POA: Diagnosis not present

## 2019-12-03 DIAGNOSIS — Z3A33 33 weeks gestation of pregnancy: Secondary | ICD-10-CM | POA: Diagnosis not present

## 2019-12-03 DIAGNOSIS — O35HXX0 Maternal care for other (suspected) fetal abnormality and damage, fetal lower extremities anomalies, not applicable or unspecified: Secondary | ICD-10-CM

## 2019-12-03 DIAGNOSIS — O99013 Anemia complicating pregnancy, third trimester: Secondary | ICD-10-CM

## 2019-12-03 DIAGNOSIS — Z6839 Body mass index (BMI) 39.0-39.9, adult: Secondary | ICD-10-CM

## 2019-12-03 DIAGNOSIS — O219 Vomiting of pregnancy, unspecified: Secondary | ICD-10-CM

## 2019-12-03 DIAGNOSIS — O10913 Unspecified pre-existing hypertension complicating pregnancy, third trimester: Secondary | ICD-10-CM | POA: Diagnosis not present

## 2019-12-03 DIAGNOSIS — O365910 Maternal care for other known or suspected poor fetal growth, first trimester, not applicable or unspecified: Secondary | ICD-10-CM

## 2019-12-03 DIAGNOSIS — E669 Obesity, unspecified: Secondary | ICD-10-CM | POA: Diagnosis not present

## 2019-12-03 DIAGNOSIS — O0990 Supervision of high risk pregnancy, unspecified, unspecified trimester: Secondary | ICD-10-CM

## 2019-12-03 DIAGNOSIS — O10919 Unspecified pre-existing hypertension complicating pregnancy, unspecified trimester: Secondary | ICD-10-CM

## 2019-12-03 NOTE — Telephone Encounter (Signed)
Letter done, posted to MyChart Let her know, and see if she needs print out or if she can just print it from [employer]

## 2019-12-03 NOTE — Telephone Encounter (Signed)
Please advise 

## 2019-12-07 ENCOUNTER — Other Ambulatory Visit: Payer: Self-pay | Admitting: Certified Nurse Midwife

## 2019-12-07 DIAGNOSIS — O35GXX0 Maternal care for other (suspected) fetal abnormality and damage, fetal upper extremities anomalies, not applicable or unspecified: Secondary | ICD-10-CM

## 2019-12-07 DIAGNOSIS — O99213 Obesity complicating pregnancy, third trimester: Secondary | ICD-10-CM

## 2019-12-07 DIAGNOSIS — O35HXX0 Maternal care for other (suspected) fetal abnormality and damage, fetal lower extremities anomalies, not applicable or unspecified: Secondary | ICD-10-CM

## 2019-12-08 ENCOUNTER — Other Ambulatory Visit: Payer: Self-pay

## 2019-12-08 ENCOUNTER — Encounter: Payer: Self-pay | Admitting: Obstetrics & Gynecology

## 2019-12-08 ENCOUNTER — Ambulatory Visit (INDEPENDENT_AMBULATORY_CARE_PROVIDER_SITE_OTHER): Payer: Federal, State, Local not specified - PPO | Admitting: Obstetrics & Gynecology

## 2019-12-08 VITALS — BP 140/80 | HR 98 | Wt 223.0 lb

## 2019-12-08 DIAGNOSIS — O10919 Unspecified pre-existing hypertension complicating pregnancy, unspecified trimester: Secondary | ICD-10-CM

## 2019-12-08 DIAGNOSIS — O358XX0 Maternal care for other (suspected) fetal abnormality and damage, not applicable or unspecified: Secondary | ICD-10-CM

## 2019-12-08 DIAGNOSIS — O10913 Unspecified pre-existing hypertension complicating pregnancy, third trimester: Secondary | ICD-10-CM | POA: Diagnosis not present

## 2019-12-08 DIAGNOSIS — O365910 Maternal care for other known or suspected poor fetal growth, first trimester, not applicable or unspecified: Secondary | ICD-10-CM

## 2019-12-08 DIAGNOSIS — Z3A33 33 weeks gestation of pregnancy: Secondary | ICD-10-CM

## 2019-12-08 DIAGNOSIS — O0993 Supervision of high risk pregnancy, unspecified, third trimester: Secondary | ICD-10-CM

## 2019-12-08 DIAGNOSIS — O99213 Obesity complicating pregnancy, third trimester: Secondary | ICD-10-CM

## 2019-12-08 DIAGNOSIS — O99013 Anemia complicating pregnancy, third trimester: Secondary | ICD-10-CM

## 2019-12-08 NOTE — Patient Instructions (Signed)
Nonstress Test A nonstress test is a procedure that is done during pregnancy in order to check the baby's heartbeat. The procedure can help show if the baby (fetus) is healthy. It is commonly done when:  The baby is past his or her due date.  The pregnancy is high risk.  The baby is moving less than normal.  The mother has lost a pregnancy in the past.  The health care provider suspects a problem with the baby's growth.  There is too much or too little amniotic fluid. The procedure is often done in the third trimester of pregnancy to find out if an early delivery is needed and whether such a delivery is safe. During a nonstress test, the baby's heartbeat is monitored when the baby is resting and when the baby is moving. If the baby is healthy, the heart rate will increase when he or she moves or kicks and will return to normal when he or she rests. Tell a health care provider about:  Any allergies you have.  Any medical conditions you have.  All medicines you are taking, including vitamins, herbs, eye drops, creams, and over-the-counter medicines. What are the risks? There are no risks to you or your baby from a nonstress test. This procedure should not be painful or uncomfortable. What happens before the procedure?  Eat a meal right before the test or as directed by your health care provider. Food may help encourage the baby to move.  Use the restroom right before the test. What happens during the procedure?  Two monitors will be placed on your abdomen. One will record the baby's heart rate and the other will record the contractions of your uterus.  You may be asked to lie down on your side or to sit upright.  You may be given a button to press when you feel your baby move.  Your health care provider will listen to your baby's heartbeat and recorded it. He or she may also watch your baby's heartbeat on a screen.  If the baby seems to be sleeping, you may be asked to drink  some juice or soda, eat a snack, or change positions. The procedure may vary among health care providers and hospitals. What happens after the procedure?  Your health care provider will discuss the test results with you and make recommendations for the future. Depending on the results, your health care provider may order additional tests or another course of action.  If your health care provider gave you any diet or activity instructions, make sure to follow them.  Keep all follow-up visits as told by your health care provider. This is important. Summary  A nonstress test is a procedure that is done during pregnancy in order to check the baby's heartbeat. The procedure can help show if the baby is healthy.  The procedure is often done in the third trimester of pregnancy to find out if an early delivery is needed and whether such a delivery is safe.  During a nonstress test, the baby's heartbeat is monitored when the baby is resting and when the baby is moving. If the baby is healthy, the heart rate will increase when he or she moves or kicks and will return to normal when he or she rests.  Your health care provider will discuss the test results with you and make recommendations for the future. This information is not intended to replace advice given to you by your health care provider. Make sure you discuss any   questions you have with your health care provider. Document Revised: 07/05/2016 Document Reviewed: 07/05/2016 Elsevier Patient Education  2020 Elsevier Inc.  

## 2019-12-08 NOTE — Progress Notes (Signed)
[facility]  [address] [place], [place] [address] Phone: [phone]  Fax: [phone]   Clinic Day:  12/09/2019  Referring physician: Vena Austria, MD  Chief Complaint: Jenna Sims is a 34 y.o. female with anemia during pregnancy who is referred in consultation with Dr. Bonney Aid for assessment and management.   HPI: The patient is currently at [redacted]w[redacted]d gestation. The pregnancy is high-risk complicated by chronic hypertension. She has had to increase her blood pressure medication during this pregnancy and is currently taking Labetalol and Procardia. Her induction is going to be scheduled at her next OBGYN visit.  The has been anemic on and off for years. She recently changed her prenatal vitamin to one that contained more iron, but it did not help. This is her third pregnancy and she had no problems with anemia during the previous pregnancies.   Labs followed:  03/23/2009: Hematocrit 41.8, hemoglobin 13.6, MCV 82.0, platelets 360,000, WBC 11,300. 11/08/2011: Hematocrit 39.4, hemoglobin 12.3, MCV 79.8, platelets 389,000, WBC 10,600. 11/18/2012: Hematocrit 33.9, hemoglobin 10.2, MCV 70.4, platelets 322,000, WBC 10,000. 11/14/2018: Hematocrit 40.5, hemoglobin 12.4, MCV 74.6, platelets 434,000, WBC   8,900. 06/17/2019: Hematocrit 38.9, hemoglobin 12.3, MCV 78.0, platelets 384,000, WBC 11,600. 09/23/2019: Hematocrit 32.7, hemoglobin 10.6, MCV 71.6, platelets 377,000, WBC 13,300. 10/30/2019: Hematocrit 33.6, hemoglobin   9.9, MCV 74,0, platelets 354,000, WBC 11,600. 11/12/2019: Hematocrit 31.9, hemoglobin 10.1, MCV 69.7, platelets 354,000, WBC 13,100. 11/30/2019: Hematocrit 31.8, hemoglobin   9.4, MCV 72.0, platelets 339,000, WBC 11,400.   Iron studies on 02/11/2017 revealed an iron saturation of 8% (low) and a TIBC of 346.  On 01/30/2018, iron saturation was 8% and TIBC 383.  B12 was 257 on 01/30/2018.  Symptomatically, she reports constipation  and reflux. She also has no appetite. She does crave baked chicken and collard greens, so she eats those foods often. She also drinks protein shakes. She reports ice pica and restless legs.  The patient has had heavy periods for a long time. They typically last from 3-5 days and she goes through about 15 pads/tampons daily. Her periods are accompanied by dizziness and lightheadedness.  The patient checks her blood pressure at home at least twice daily. Systolic typically runs in the 140-150s and diastolic runs in the 80-90s.  The patient denies fevers, sweats, headaches, changes in vision, runny nose, sore throat, cough, shortness of breath, chest pain, palpitations, nausea, vomiting, diarrhea, urinary symptoms, bone or joint symptoms, skin changes, numbness, weakness, balance or coordination problems, and bleeding of any kind.   The patient's father has sickle cell trait. The patient does not have the trait but notes that some of her other siblings do.    Past Medical History:  Diagnosis Date  . Abdominal pain   . Depression   . Heavy menstrual bleeding   . HSV-1 infection   . Hypertension   . Lower back pain     Past Surgical History:  Procedure Laterality Date  . DILATION AND CURETTAGE OF UTERUS    . LAPAROSCOPY N/A 11/18/2018   Procedure: LAPAROSCOPY DIAGNOSTIC;  Surgeon: Conard Novak, MD;  Location: [facility] ORS;  Service: Gynecology;  Laterality: N/A;  . WISDOM TOOTH EXTRACTION      Family History  Problem Relation Age of Onset  . Hypertension Mother   . Hypertension Father   . Diabetes Maternal Grandfather   . Diabetes Paternal Grandmother     Social History:  reports that she has never smoked. She has never used smokeless  tobacco. She reports previous alcohol use of about 1.0 standard drink of alcohol per week. She reports that she does not use drugs.Denies alcohol or tobacco. She denies exposure to radiation or toxins. She lives in [place]. 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 She is married and has two other sons named [name] and [place]. She is going to have a boy, Creed.  The patient is alone today.  Allergies: No Known Allergies  Current Medications: Current Outpatient Medications  Medication Sig Dispense Refill  . aspirin 81 MG chewable tablet Chew by mouth daily.    Marland Kitchen labetalol (NORMODYNE) 100 MG tablet Take 400 mg by mouth 2 (two) times daily.     Marland Kitchen NIFEdipine (PROCARDIA XL) 60 MG 24 hr tablet Take 1 tablet (60 mg total) by mouth daily. 30 tablet 4  . Prenatal Vit-Fe Fumarate-FA (MULTIVITAMIN-PRENATAL) 27-0.8 MG TABS tablet Take 1 tablet by mouth daily at 12 noon.    . promethazine (PHENERGAN) 25 MG tablet Take 1 tablet (25 mg total) by mouth every 6 (six) hours as needed for nausea or vomiting. 30 tablet 2   No current facility-administered medications for this visit.    Review of Systems  Constitutional: Negative for chills, diaphoresis, fever, malaise/fatigue and weight loss.  HENT: Negative for congestion, ear discharge, ear pain, hearing loss, nosebleeds, sinus pain, sore throat and tinnitus.   Eyes: Negative for blurred vision.  Respiratory: Negative for cough, hemoptysis, sputum production and shortness of breath.   Cardiovascular: Negative for chest pain, palpitations and leg swelling.  Gastrointestinal: Positive for constipation and heartburn (reflux). Negative for abdominal pain, blood in stool, diarrhea, melena, nausea and vomiting.       No appetite. Reports ice pica.  Genitourinary: Negative for dysuria, frequency, hematuria and urgency.       H/o heavy periods.  Musculoskeletal: Negative for back pain, joint pain, myalgias and neck pain.  Skin: Negative for itching and rash.  Neurological: Negative for dizziness (during menses), tingling, sensory change, weakness and headaches.       Reports restless legs.  Endo/Heme/Allergies: Does not  bruise/bleed easily.  Psychiatric/Behavioral: Negative for depression and memory loss. The patient is not nervous/anxious and does not have insomnia.   All other systems reviewed and are negative.  Performance status (ECOG): 1  Vitals Blood pressure (!) 147/95, pulse 96, temperature (!) 96 F (35.6 C), temperature source Tympanic, resp. rate 18, weight 220 lb 7.4 oz (100 kg), last menstrual period 04/20/2019, SpO2 100 %, unknown if currently breastfeeding.   Physical Exam Vitals and nursing note reviewed.  Constitutional:      General: She is not in acute distress.    Appearance: She is not diaphoretic.  HENT:     Head: Normocephalic and atraumatic.     Mouth/Throat:     Mouth: Mucous membranes are moist.     Pharynx: Oropharynx is clear.  Eyes:     General: No scleral icterus.    Extraocular Movements: Extraocular movements intact.     Conjunctiva/sclera: Conjunctivae normal.     Pupils: Pupils are equal, round, and reactive to light.     Comments: Glasses.  Brown eyes.  Cardiovascular:     Rate and Rhythm: Normal rate and regular rhythm.     Heart sounds: Normal heart sounds. No murmur heard.   Pulmonary:     Effort: Pulmonary effort is normal. No respiratory distress.  Breath sounds: Normal breath sounds. No wheezing or rales.  Chest:     Chest wall: No tenderness.  Abdominal:     General: Bowel sounds are normal. There is no distension.     Palpations: Abdomen is soft. There is no hepatomegaly, splenomegaly or mass.     Tenderness: There is no abdominal tenderness. There is no guarding or rebound.     Comments: Pregnant.  Musculoskeletal:        General: No swelling or tenderness. Normal range of motion.     Cervical back: Normal range of motion and neck supple.  Lymphadenopathy:     Head:     Right side of head: No preauricular, posterior auricular or occipital adenopathy.     Left side of head: No preauricular, posterior auricular or occipital adenopathy.      Cervical: No cervical adenopathy.     Upper Body:     Right upper body: No supraclavicular adenopathy.     Left upper body: No supraclavicular adenopathy.     Lower Body: No right inguinal adenopathy. No left inguinal adenopathy.  Skin:    General: Skin is warm and dry.  Neurological:     Mental Status: She is alert and oriented to person, place, and time.  Psychiatric:        Behavior: Behavior normal.        Thought Content: Thought content normal.        Judgment: Judgment normal.    No visits with results within 3 Day(s) from this visit.  Latest known visit with results is:  Routine Prenatal on 11/30/2019  Component Date Value Ref Range Status  . WBC 11/30/2019 11.4* 3.4 - 10.8 x10E3/uL Final  . RBC 11/30/2019 4.43  3.77 - 5.28 x10E6/uL Final  . Hemoglobin 11/30/2019 9.4* 11.1 - 15.9 g/dL Final  . Hematocrit 16/10/960408/23/2021 31.8* 34.0 - 46.6 % Final  . MCV 11/30/2019 72* 79 - 97 fL Final  . MCH 11/30/2019 21.2* 26.6 - 33.0 pg Final  . MCHC 11/30/2019 29.6* 31 - 35 g/dL Final  . RDW 54/09/811908/23/2021 15.8* 11.7 - 15.4 % Final  . Platelets 11/30/2019 339  150 - 450 x10E3/uL Final    Assessment:  Jenna Sims is a 34 y.o. female with a microcytic anemia felt secondary to iron deficiency during pregnancy.  She is currently at 841w0d gestation.  She has a history of heavy menses.  Diet is modest.  She has ice pica and restless legs.  CBC on 11/30/2019 revealed a hematocrit 31.8, hemoglobin 9.4, MCV 72.0, platelets 339,000, WBC 11,400.   She has a history of a low normal B12.  B12 was 257 on 01/30/2018.  Symptomatically, she notes constipation and reflux.  She denies any bleeding.  Exam is unremarkable.  Plan: 1.   Labs today:  CBC with diff, ferritin, iron studies, B12, folate. 2.   Microcytic anemia  Etiology is felt secondary to iron deficiency.  Diet is modest.  She denies any bleeding.  Discuss oral versus IV iron.  She has issues with constipation.  Potential side effects of  IV iron reviewed.  Information provided.  Patient consented to IV iron.  Preauth Venofer.  Venofer today and weekly x 2 (total 3 doses).  3.   History of low normal B12  Check B12 and folate today. 4.   RTC for Venofer x 3 (first dose today). 5.   RTC next week when patient returns for Venofer for MD assessment, review of work-up, and  Venofer.   Addendum:  B12 level today is 119 (low).  Patient will be contacted regarding initiation of oral B12 versus B12 injections.  Her gynecologist will also be contacted regarding B12 preference.  I discussed the assessment and treatment plan with the patient.  The patient was provided an opportunity to ask questions and all were answered.  The patient agreed with the plan and demonstrated an understanding of the instructions.  The patient was advised to call back if the symptoms worsen or if the condition fails to improve as anticipated.   Tiffanny Lamarche C. Merlene Pulling, MD, PhD    12/09/2019, 10:39 AM  I, Danella Penton Tufford, am acting as Neurosurgeon for [employer]. Merlene Pulling, MD, PhD.  I, Ardine Iacovelli C. Merlene Pulling, MD, have reviewed the above documentation for accuracy and completeness, and I agree with the above.

## 2019-12-08 NOTE — Progress Notes (Signed)
Subjective  Fetal Movement? yes Contractions? no Leaking Fluid? no Vaginal Bleeding? no Denies ha, blurry vision, CP, SOB, epigastic pain, edema No longer working Labetalol and Procardia daily NST weekly here, BPP weekly MFM Hematology appt tomorrow    Anemia, considering therapy Objective  BP 140/80    Pulse 98    Wt 223 lb (101.2 kg)    LMP 04/20/2019    BMI 38.28 kg/m  General: NAD Pumonary: no increased work of breathing Abdomen: gravid, non-tender Extremities: no edema Psychiatric: mood appropriate, affect full  A NST procedure was performed with FHR monitoring and a normal baseline established, appropriate time of 20-40 minutes of evaluation, and accels >2 seen w 15x15 characteristics.  Results show a REACTIVE NST.   Assessment  34 y.o. 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 R/O FGR-followed weekly with Doppler studies and antepartum testing  8/26 at 33wk1d: CGA 31wk1d  AFI 13   BPP 8/8   EFW 1501 gm (3#5oz)  <1st%. Normal Dopplers  MFMs recommend delivery at 36-37 weeks, BMZ prior to delivery       Previous Version   Supervision of high risk pregnancy, antepartum 06/17/2019 by Conard Novak, MD No   Overview Addendum 11/30/2019  1:47 PM by Vena Austria, MD    Clinic [facility] Prenatal Labs  Dating 11wk1d [place] Blood type: A/Negative/-- (03/10 1543)   Genetic Screen 1 Screen: neg   AFP: declines    Antibody:Negative (03/10 1543)  Anatomic [place] Normal Female 09/04/2019 Subsequent growth scan short long bones consistent with skeletal dysplasia such as achondroplasia  Rubella: 2.54 (03/10 1543) Varicella: Immune  GTT Early:  141, 3hr WNL             Third trimester 3-hr: 68 / 161 / 141 / 107 RPR: Non Reactive (03/10 1543)   Rhogam 10/30/2019 HBsAg: Negative (03/10 1543)   TDaP vaccine 11/30/2019 HIV: Non Reactive (03/10 1543)   Baby Food      BREAST                          GBS: POS in URINE so TX  Contraception Vasectomy Pap: 01/08/2018 NILM  CBB  No   CS/VBAC N/A   Support Person Laron (spouse)          Previous Version   Chronic hypertension affecting pregnancy     Overview Addendum 12/08/2019  2:02 PM by Nadara Mustard, MD    [ ]  Aspirin 81 mg daily after 12 weeks; discontinue after 36 weeks [x baseline  labs with CBC, CMP, urine protein/creatinine ratio [x ] no BP meds unless BPs become elevated- on Labetalol [ ]  ultrasound for growth at 28, 32, 36 weeks [ ]  Aspirin 81 mg daily after 12 weeks; discontinue after 36 weeks. Started 5/18 [ ]  Baseline EKG   Current antihypertensives: 3/11 labetalol 200 mgm BID 3/25 labetalol increased to 300 mgm BID 08/25/19 dose increased to 400mg m BID Also, Procardia XL 60 mg daily  Baseline and surveillance labs (pulled in from [facility], refresh links as needed)  Lab Results  Component Value Date   PLT 384 06/17/2019   CREATININE 0.60 06/17/2019   AST 18 06/17/2019   ALT 18 06/17/2019    Antenatal Testing [facility] - O10.919  Group I  BP < 140/90, no preeclampsia, AGA,  nml AFV, +/- meds    Group II BP > 140/90, on meds, no preeclampsia, AGA,  nml AFV  20-28-34-38  20-24-28-32-35-38  32//2 x wk  28//BPP wkly then 32//2 x wk  40 no meds; 39 meds  PRN or 37  Pre-eclampsia  GHTN - O13.9/Preeclampsia without severe features  - O14.00   Preeclampsia with severe features - O14.10  Q 3-4wks  Q 2 wks  28//BPP wkly then 32//2 x wk  Inpatient  37  PRN or 34          08/17/2019, MD, FACOG [facility], [facility] 12/08/2019  2:10 PM

## 2019-12-09 ENCOUNTER — Encounter: Payer: Self-pay | Admitting: Hematology and Oncology

## 2019-12-09 ENCOUNTER — Inpatient Hospital Stay: Payer: Federal, State, Local not specified - PPO

## 2019-12-09 ENCOUNTER — Telehealth: Payer: Self-pay | Admitting: Hematology and Oncology

## 2019-12-09 ENCOUNTER — Inpatient Hospital Stay
Payer: Federal, State, Local not specified - PPO | Attending: Hematology and Oncology | Admitting: Hematology and Oncology

## 2019-12-09 VITALS — BP 147/95 | HR 96 | Temp 96.0°F | Resp 18 | Wt 220.5 lb

## 2019-12-09 VITALS — BP 121/83 | HR 76 | Resp 18

## 2019-12-09 DIAGNOSIS — F5089 Other specified eating disorder: Secondary | ICD-10-CM

## 2019-12-09 DIAGNOSIS — G2581 Restless legs syndrome: Secondary | ICD-10-CM

## 2019-12-09 DIAGNOSIS — D509 Iron deficiency anemia, unspecified: Secondary | ICD-10-CM | POA: Diagnosis not present

## 2019-12-09 DIAGNOSIS — O99013 Anemia complicating pregnancy, third trimester: Secondary | ICD-10-CM

## 2019-12-09 DIAGNOSIS — E538 Deficiency of other specified B group vitamins: Secondary | ICD-10-CM | POA: Diagnosis not present

## 2019-12-09 DIAGNOSIS — K5900 Constipation, unspecified: Secondary | ICD-10-CM

## 2019-12-09 DIAGNOSIS — O163 Unspecified maternal hypertension, third trimester: Secondary | ICD-10-CM

## 2019-12-09 DIAGNOSIS — Z3A34 34 weeks gestation of pregnancy: Secondary | ICD-10-CM | POA: Diagnosis not present

## 2019-12-09 DIAGNOSIS — K219 Gastro-esophageal reflux disease without esophagitis: Secondary | ICD-10-CM

## 2019-12-09 LAB — CBC WITH DIFFERENTIAL/PLATELET
Abs Immature Granulocytes: 0.06 10*3/uL (ref 0.00–0.07)
Basophils Absolute: 0.1 10*3/uL (ref 0.0–0.1)
Basophils Relative: 0 %
Eosinophils Absolute: 0.1 10*3/uL (ref 0.0–0.5)
Eosinophils Relative: 1 %
HCT: 29.6 % — ABNORMAL LOW (ref 36.0–46.0)
Hemoglobin: 9.2 g/dL — ABNORMAL LOW (ref 12.0–15.0)
Immature Granulocytes: 1 %
Lymphocytes Relative: 26 %
Lymphs Abs: 3 10*3/uL (ref 0.7–4.0)
MCH: 21.6 pg — ABNORMAL LOW (ref 26.0–34.0)
MCHC: 31.1 g/dL (ref 30.0–36.0)
MCV: 69.6 fL — ABNORMAL LOW (ref 80.0–100.0)
Monocytes Absolute: 0.7 10*3/uL (ref 0.1–1.0)
Monocytes Relative: 6 %
Neutro Abs: 7.6 10*3/uL (ref 1.7–7.7)
Neutrophils Relative %: 66 %
Platelets: 314 10*3/uL (ref 150–400)
RBC: 4.25 MIL/uL (ref 3.87–5.11)
RDW: 16.6 % — ABNORMAL HIGH (ref 11.5–15.5)
WBC: 11.4 10*3/uL — ABNORMAL HIGH (ref 4.0–10.5)
nRBC: 0 % (ref 0.0–0.2)

## 2019-12-09 LAB — IRON AND TIBC
Iron: 10 ug/dL — ABNORMAL LOW (ref 28–170)
Saturation Ratios: 2 % — ABNORMAL LOW (ref 10.4–31.8)
TIBC: 477 ug/dL — ABNORMAL HIGH (ref 250–450)
UIBC: 467 ug/dL

## 2019-12-09 LAB — FERRITIN: Ferritin: 4 ng/mL — ABNORMAL LOW (ref 11–307)

## 2019-12-09 LAB — FOLATE: Folate: 7.6 ng/mL (ref >5.9)

## 2019-12-09 LAB — VITAMIN B12: Vitamin B-12: 119 pg/mL — ABNORMAL LOW (ref 180–914)

## 2019-12-09 MED ORDER — IRON SUCROSE 20 MG/ML IV SOLN
200.0000 mg | Freq: Once | INTRAVENOUS | Status: AC
  Administered 2019-12-09: 200 mg via INTRAVENOUS
  Filled 2019-12-09: qty 10

## 2019-12-09 MED ORDER — SODIUM CHLORIDE 0.9 % IV SOLN: 200.0000 mg | Freq: Once | INTRAVENOUS | Status: DC

## 2019-12-09 MED ORDER — SODIUM CHLORIDE 0.9 % IV SOLN
Freq: Once | INTRAVENOUS | Status: AC
  Filled 2019-12-09: qty 250

## 2019-12-09 NOTE — Telephone Encounter (Signed)
I called patient to give her appointments and told her to stop taking oral iron med per R [place] and to get prenatals w/o iron, Patient agreed and understood,

## 2019-12-09 NOTE — Progress Notes (Signed)
Patient reports some constipation and no appetite

## 2019-12-09 NOTE — Patient Instructions (Signed)

## 2019-12-10 ENCOUNTER — Ambulatory Visit: Payer: Federal, State, Local not specified - PPO | Attending: Maternal & Fetal Medicine

## 2019-12-10 ENCOUNTER — Other Ambulatory Visit: Payer: Self-pay

## 2019-12-10 ENCOUNTER — Telehealth: Payer: Self-pay

## 2019-12-10 VITALS — BP 136/94 | HR 101 | Temp 98.4°F | Wt 221.0 lb

## 2019-12-10 DIAGNOSIS — O365910 Maternal care for other known or suspected poor fetal growth, first trimester, not applicable or unspecified: Secondary | ICD-10-CM

## 2019-12-10 DIAGNOSIS — Z3A34 34 weeks gestation of pregnancy: Secondary | ICD-10-CM | POA: Diagnosis not present

## 2019-12-10 DIAGNOSIS — O35HXX0 Maternal care for other (suspected) fetal abnormality and damage, fetal lower extremities anomalies, not applicable or unspecified: Secondary | ICD-10-CM

## 2019-12-10 DIAGNOSIS — O0990 Supervision of high risk pregnancy, unspecified, unspecified trimester: Secondary | ICD-10-CM

## 2019-12-10 DIAGNOSIS — O99213 Obesity complicating pregnancy, third trimester: Secondary | ICD-10-CM | POA: Diagnosis not present

## 2019-12-10 DIAGNOSIS — Z6839 Body mass index (BMI) 39.0-39.9, adult: Secondary | ICD-10-CM

## 2019-12-10 DIAGNOSIS — O10919 Unspecified pre-existing hypertension complicating pregnancy, unspecified trimester: Secondary | ICD-10-CM

## 2019-12-10 DIAGNOSIS — O99013 Anemia complicating pregnancy, third trimester: Secondary | ICD-10-CM

## 2019-12-10 DIAGNOSIS — O358XX0 Maternal care for other (suspected) fetal abnormality and damage, not applicable or unspecified: Secondary | ICD-10-CM

## 2019-12-10 DIAGNOSIS — O35GXX0 Maternal care for other (suspected) fetal abnormality and damage, fetal upper extremities anomalies, not applicable or unspecified: Secondary | ICD-10-CM

## 2019-12-10 NOTE — Telephone Encounter (Signed)
Either is fine, safe for pregnancy. Up to Dr Merlene Pulling, I would say.  Injections may get levels normalized faster.

## 2019-12-10 NOTE — Telephone Encounter (Signed)
Courtney from Dr. Danton Sewer office calling; saw pt yesterday; B 12 is low; wants to know if you want pt to take daily or injections.  [phone]

## 2019-12-10 NOTE — Telephone Encounter (Signed)
Tried calling the number given; no answer; sent msg in secure chat to [place].

## 2019-12-10 NOTE — Progress Notes (Signed)
Pls review in CLG absence. Seeing you 9/8. Thx

## 2019-12-10 NOTE — Telephone Encounter (Signed)
-----   Message from Rosey Bath, MD sent at 12/09/2019  4:56 PM EDT ----- Regarding: Please call patient and OB  Call her OB first.   B12 is low.    Would they like [place] to start B12 1000 mcg po a day or B12 injections?  M ----- Message ----- From: Leory Plowman, Lab [facility] [place] Sent: 12/09/2019  11:19 AM EDT To: Rosey Bath, MD

## 2019-12-10 NOTE — Telephone Encounter (Signed)
Spoke with the patient Ob / gyn to inform them that her B-12 levels are low and what would they like to do. Start oral B-12 1000 mcg daily or B-12 injection monthly.

## 2019-12-10 NOTE — Telephone Encounter (Signed)
Per Dr Tiburcio Pea they are fine with either way, but the B-12 injection will rise the B-12 levels faster. Dr Merlene Pulling has been informed.

## 2019-12-11 ENCOUNTER — Telehealth: Payer: Self-pay

## 2019-12-11 NOTE — Telephone Encounter (Signed)
Spoke with the patient to inform her that her B-12 levels are low and per Dr Tiburcio Pea and Dr Merlene Pulling which is in agreement to start the patient on B-12 injection weekly x 4 then monthly. The patient was understanding and Ms Jenna Sims has been contacted and the patient has been schedule and the patient has been contacted.

## 2019-12-14 DIAGNOSIS — E538 Deficiency of other specified B group vitamins: Secondary | ICD-10-CM | POA: Insufficient documentation

## 2019-12-15 DIAGNOSIS — Z630 Problems in relationship with spouse or partner: Secondary | ICD-10-CM | POA: Diagnosis not present

## 2019-12-15 NOTE — Progress Notes (Signed)
Jenna Sims  [address] [place], [place] [address] Phone: [phone]  Fax: [phone]   Clinic Day:  12/16/2019  Referring physician: Clent Jacks, PA-C  Chief Complaint: Jenna Sims is a 34 y.o. female with anemia during pregnancy who is seen for review of initial work-up and continuation of IV iron.   HPI: The patient was last seen in the hematology clinic on 12/09/2019 for new patient assessment.  At that time, she was [redacted] weeks gestation.  Diet was modest.  She denied any bleeding.  She had ice pica and restless legs.  She noted constipation and reflux.  Exam was unremarkable.  She received Venofer.  Work-up revealed a hematocrit 29.6, hemoglobin 9.2, MCV 69.6, platelets 314,000, WBC 11,400 (ANC 7,600). Ferritin was 4 with an iron saturation of 2% and a TIBC of 477. Folate was 7.6 and Vitamin B12 was 119 (low).   The patient and her OBGYN were contacted about her low vitamin B12 level. Decision was made to pursue B12 injections.  During the interim, she has been good. Her first dose of Venofer went well except for a metallic taste in her mouth. She still has some restless legs and ice pica. She denies bleeding of any kind.   The patient is [redacted] weeks pregnant today. She is undergoing twice weekly surveillance with alternating NST/BPPs and is planning for induction at 37 weeks. The baby is active.   Past Medical History:  Diagnosis Date  . Abdominal pain   . Depression   . Heavy menstrual bleeding   . HSV-1 infection   . Hypertension   . Lower back pain     Past Surgical History:  Procedure Laterality Date  . DILATION AND CURETTAGE OF UTERUS    . LAPAROSCOPY N/A 11/18/2018   Procedure: LAPAROSCOPY DIAGNOSTIC;  Surgeon: Conard Novak, MD;  Location: [facility] ORS;  Service: Gynecology;  Laterality: N/A;  . WISDOM TOOTH EXTRACTION      Family History  Problem Relation Age of Onset  . Hypertension Mother   . Hypertension  Father   . Diabetes Maternal Grandfather   . Diabetes Paternal Grandmother     Social History:  reports that she has never smoked. She has never used smokeless tobacco. She reports previous alcohol use of about 1.0 standard drink of alcohol per week. She reports that she does not use drugs.Denies alcohol or tobacco. She denies exposure to radiation or toxins. She lives in [place]. 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 She is married and has two other sons named [name] and Hagerman. She is going to have a boy, Creed.  The patient is alone today.  Allergies: No Known Allergies  Current Medications: Current Outpatient Medications  Medication Sig Dispense Refill  . aspirin 81 MG chewable tablet Chew by mouth daily.    Marland Kitchen labetalol (NORMODYNE) 100 MG tablet Take 400 mg by mouth 2 (two) times daily.     . Prenatal Vit-Fe Fumarate-FA (MULTIVITAMIN-PRENATAL) 27-0.8 MG TABS tablet Take 1 tablet by mouth daily at 12 noon.    Marland Kitchen NIFEdipine (PROCARDIA XL) 60 MG 24 hr tablet Take 1 tablet (60 mg total) by mouth daily. 30 tablet 4  . promethazine (PHENERGAN) 25 MG tablet Take 1 tablet (25 mg total) by mouth every 6 (six) hours as needed for nausea or vomiting. (Patient not taking: Reported on 12/16/2019) 30 tablet 2   No  current facility-administered medications for this visit.    Review of Systems  Constitutional: Positive for weight loss (3 lbs). Negative for chills, diaphoresis, fever and malaise/fatigue.       Doing well. Currently [redacted] weeks gestation.  HENT: Negative for congestion, ear discharge, ear pain, hearing loss, nosebleeds, sinus pain, sore throat and tinnitus.        Metallic taste s/p Venofer, resolved.  Eyes: Negative for blurred vision.  Respiratory: Negative for cough, hemoptysis, sputum production and shortness of breath.   Cardiovascular: Negative for chest pain, palpitations and leg swelling.    Gastrointestinal: Negative for abdominal pain, blood in stool, constipation, diarrhea, heartburn, melena, nausea and vomiting.       Reports Ice pica.  Genitourinary: Negative for dysuria, frequency, hematuria and urgency.       H/o heavy periods.  Musculoskeletal: Negative for back pain, joint pain, myalgias and neck pain.  Skin: Negative for itching and rash.  Neurological: Negative for dizziness, tingling, sensory change, weakness and headaches.       Reports restless legs.  Endo/Heme/Allergies: Does not bruise/bleed easily.  Psychiatric/Behavioral: Negative for depression and memory loss. The patient is not nervous/anxious and does not have insomnia.   All other systems reviewed and are negative.  Performance status (ECOG): 1  Vitals Blood pressure (!) 155/84, pulse 89, temperature (!) 96.9 F (36.1 C), temperature source Tympanic, resp. rate 18, weight 218 lb 4.1 oz (99 kg), last menstrual period 04/20/2019, SpO2 100 %, unknown if currently breastfeeding.   Physical Exam Vitals and nursing note reviewed.  Constitutional:      General: She is not in acute distress.    Appearance: She is not diaphoretic.  HENT:     Mouth/Throat:     Mouth: Mucous membranes are moist.     Pharynx: Oropharynx is clear.  Eyes:     General: No scleral icterus.    Extraocular Movements: Extraocular movements intact.     Conjunctiva/sclera: Conjunctivae normal.     Pupils: Pupils are equal, round, and reactive to light.     Comments: Glasses.  Brown eyes.  Cardiovascular:     Rate and Rhythm: Normal rate and regular rhythm.     Heart sounds: Normal heart sounds. No murmur heard.   Pulmonary:     Effort: Pulmonary effort is normal. No respiratory distress.     Breath sounds: Normal breath sounds. No wheezing or rales.  Chest:     Chest wall: No tenderness.  Abdominal:     Palpations: There is no hepatomegaly or splenomegaly.     Comments: Pregnant.  Musculoskeletal:        General: No  swelling or tenderness. Normal range of motion.     Right lower leg: No edema.     Left lower leg: No edema.  Skin:    General: Skin is warm and dry.  Neurological:     Mental Status: She is alert and oriented to person, place, and time.  Psychiatric:        Behavior: Behavior normal.        Thought Content: Thought content normal.        Judgment: Judgment normal.    No visits with results within 3 Day(s) from this visit.  Latest known visit with results is:  Appointment on 12/09/2019  Component Date Value Ref Range Status  . Folate 12/09/2019 7.6  >5.9 ng/mL Final   Performed at [facility], [address] [place] [place], [place] [address]  . Vitamin  B-12 12/09/2019 119* 180 - 914 pg/mL Final   Comment: (NOTE) This assay is not validated for testing neonatal or myeloproliferative syndrome specimens for Vitamin B12 levels. Performed at [facility], [address], [place], [place] [address]   . Iron 12/09/2019 10* 28 - 170 ug/dL Final  . TIBC 97/05/6376 477* 250 - 450 ug/dL Final  . Saturation Ratios 12/09/2019 2* 10.4 - 31.8 % Final  . UIBC 12/09/2019 467  ug/dL Final   Performed at [facility], [address] [place], [place] [address]  . Ferritin 12/09/2019 4* 11 - 307 ng/mL Final   Performed at [facility], [address] [place] [place], [place] [address]  . WBC 12/09/2019 11.4* 4.0 - 10.5 K/uL Final  . RBC 12/09/2019 4.25  3.87 - 5.11 MIL/uL Final  . Hemoglobin 12/09/2019 9.2* 12.0 - 15.0 g/dL Final  . HCT 28/78/6767 29.6* 36 - 46 % Final  . MCV 12/09/2019 69.6* 80.0 - 100.0 fL Final  . MCH 12/09/2019 21.6* 26.0 - 34.0 pg Final  . MCHC 12/09/2019 31.1  30.0 - 36.0 g/dL Final  . RDW 20/94/7096 16.6* 11.5 - 15.5 % Final  . Platelets 12/09/2019 314  150 - 400 K/uL Final  . nRBC 12/09/2019 0.0  0.0 - 0.2 % Final  . Neutrophils Relative % 12/09/2019 66  % Final  . Neutro Abs 12/09/2019 7.6  1.7 - 7.7 K/uL Final  . Lymphocytes Relative  12/09/2019 26  % Final  . Lymphs Abs 12/09/2019 3.0  0.7 - 4.0 K/uL Final  . Monocytes Relative 12/09/2019 6  % Final  . Monocytes Absolute 12/09/2019 0.7  0 - 1 K/uL Final  . Eosinophils Relative 12/09/2019 1  % Final  . Eosinophils Absolute 12/09/2019 0.1  0 - 0 K/uL Final  . Basophils Relative 12/09/2019 0  % Final  . Basophils Absolute 12/09/2019 0.1  0 - 0 K/uL Final  . Immature Granulocytes 12/09/2019 1  % Final  . Abs Immature Granulocytes 12/09/2019 0.06  0.00 - 0.07 K/uL Final   Performed at [facility], [address], [place], [place] [address]    Assessment:  ANASHA PERFECTO is a 34 y.o. female with iron deficiency anemia during pregnancy.  She is currently at [redacted]w[redacted]d gestation.  She has a history of heavy menses.  Diet is modest.  She has ice pica and restless legs.  CBC on 11/30/2019 revealed a hematocrit 31.8, hemoglobin 9.4, MCV 72.0, platelets 339,000, WBC 11,400.   Work-up on 12/09/2019 revealed a hematocrit 29.6, hemoglobin 9.2, MCV 69.6, platelets 314,000, WBC 11,400 (ANC 7,600). Ferritin was 4 with an iron saturation of 2% and a TIBC of 477. Folate was 7.6 and Vitamin B12 was 119 (low).   She began Venofer on 12/09/2019.    She has a B12 deficiency.  B12 was 257 on 01/30/2018 and 119 on 12/09/2019.  She began B12 injections on 12/16/2019.  Symptomatically, she is doing well.  She is [redacted] weeks pregnant.  She is tolerating IV iron well.  Exam is stable.  Plan: 1.   Review labs from 12/09/2019. 2.   Iron deficiency anemia  Hematocrit 29.6.  Hemoglobin 9.2.  MCV 69.6 on 12/09/2019.   Ferritin was 4 with an iron saturation of 2%.    She denies any bleeding.  Diet is modest.  She began IV iron on 12/09/2019.   She is tolerating iron well.  Venofer today and in 1 week.  3.   B12  deficiency  B12 was 119 on 12/09/2019.  She began B12 injections on 12/16/2019.  Plan B12 weekly x 4 then monthly. 4.   Venofer today and in 1 week. 5.   B12 today and in 1  week. 6.   When patient returns next week, add lab (HCT/hemoglobin). 7.   RTC 1st week in October (after delivery) for MD assess, labs (CBC with diff, ferritin- day before), B12 and +/- Venofer.  I discussed the assessment and treatment plan with the patient.  The patient was provided an opportunity to ask questions and all were answered.  The patient agreed with the plan and demonstrated an understanding of the instructions.  The patient was advised to call back if the symptoms worsen or if the condition fails to improve as anticipated.   Jeremi Losito C. Merlene Pullingorcoran, MD, PhD    12/16/2019, 9:32 AM  I, Danella PentonEmily J Tufford, am acting as Neurosurgeonscribe for General MotorsMelissa C. Merlene Pullingorcoran, MD, PhD.  I, Cleotilde Spadaccini C. Merlene Pullingorcoran, MD, have reviewed the above documentation for accuracy and completeness, and I agree with the above.

## 2019-12-16 ENCOUNTER — Inpatient Hospital Stay: Payer: Federal, State, Local not specified - PPO

## 2019-12-16 ENCOUNTER — Ambulatory Visit (INDEPENDENT_AMBULATORY_CARE_PROVIDER_SITE_OTHER): Payer: Federal, State, Local not specified - PPO | Admitting: Advanced Practice Midwife

## 2019-12-16 ENCOUNTER — Encounter: Payer: Self-pay | Admitting: Advanced Practice Midwife

## 2019-12-16 ENCOUNTER — Other Ambulatory Visit: Payer: Self-pay

## 2019-12-16 ENCOUNTER — Inpatient Hospital Stay (HOSPITAL_BASED_OUTPATIENT_CLINIC_OR_DEPARTMENT_OTHER): Payer: Federal, State, Local not specified - PPO | Admitting: Hematology and Oncology

## 2019-12-16 ENCOUNTER — Encounter: Payer: Self-pay | Admitting: Hematology and Oncology

## 2019-12-16 VITALS — BP 155/84 | HR 89 | Temp 96.9°F | Resp 18 | Wt 218.3 lb

## 2019-12-16 VITALS — BP 143/88 | HR 90 | Resp 18

## 2019-12-16 VITALS — BP 130/80 | HR 102 | Wt 222.0 lb

## 2019-12-16 DIAGNOSIS — E538 Deficiency of other specified B group vitamins: Secondary | ICD-10-CM

## 2019-12-16 DIAGNOSIS — O10919 Unspecified pre-existing hypertension complicating pregnancy, unspecified trimester: Secondary | ICD-10-CM | POA: Diagnosis not present

## 2019-12-16 DIAGNOSIS — Z3A35 35 weeks gestation of pregnancy: Secondary | ICD-10-CM | POA: Diagnosis not present

## 2019-12-16 DIAGNOSIS — D509 Iron deficiency anemia, unspecified: Secondary | ICD-10-CM

## 2019-12-16 DIAGNOSIS — O0993 Supervision of high risk pregnancy, unspecified, third trimester: Secondary | ICD-10-CM

## 2019-12-16 DIAGNOSIS — O99213 Obesity complicating pregnancy, third trimester: Secondary | ICD-10-CM

## 2019-12-16 MED ORDER — SODIUM CHLORIDE 0.9 % IV SOLN
Freq: Once | INTRAVENOUS | Status: AC
  Filled 2019-12-16: qty 250

## 2019-12-16 MED ORDER — CYANOCOBALAMIN 1000 MCG/ML IJ SOLN
1000.0000 ug | Freq: Once | INTRAMUSCULAR | Status: AC
  Administered 2019-12-16: 1000 ug via INTRAMUSCULAR

## 2019-12-16 MED ORDER — SODIUM CHLORIDE 0.9 % IV SOLN: 200.0000 mg | Freq: Once | INTRAVENOUS | Status: DC

## 2019-12-16 MED ORDER — IRON SUCROSE 20 MG/ML IV SOLN
200.0000 mg | Freq: Once | INTRAVENOUS | Status: AC
  Administered 2019-12-16: 200 mg via INTRAVENOUS
  Filled 2019-12-16: qty 10

## 2019-12-16 NOTE — Progress Notes (Signed)
No questions or concerns. Still eating ice. Restless legs are stable.

## 2019-12-16 NOTE — Progress Notes (Signed)
Routine Prenatal Care Visit  Subjective  Jenna Sims is a 34 y.o. [phone] at [redacted]w[redacted]d being seen today for ongoing prenatal care.  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  ----------------------------------------------------------------------------------- Patient reports having had her second iron infusion and not noticing a big difference yet in energy level. We discussed delivery timing- Dr Ivar Bury recommendation to deliver at 37 weeks. IOL scheduled for 12/30/19. She is taking BP meds as ordered. She denies headache, visual changes, epigastric pain.   Contractions: Not present. Vag. Bleeding: None.  Movement: Present. Leaking Fluid denies.  ----------------------------------------------------------------------------------- The following portions of the patient's history were reviewed and updated as appropriate: allergies, current medications, past family history, past medical history, past social history, past surgical history and problem list. Problem list updated.  Objective  Blood pressure 130/80, pulse (!) 102, weight 222 lb (100.7 kg), last menstrual period 04/20/2019 Pregravid weight 210 lb (95.3 kg) Total Weight Gain 12 lb (5.443 kg) Urinalysis: Urine Protein    Urine Glucose    Fetal Status: Fetal Heart Rate (bpm): 140   Movement: Present      NST:  reactive 20 minute tracing, 140 bpm, moderate variability with 7 or 8 accelerations to 170/180, -decelerations  General:  Alert, oriented and cooperative. Patient is in no acute distress.  Skin: Skin is warm and dry. No rash noted.   Cardiovascular: Normal heart rate noted  Respiratory: Normal respiratory effort, no problems with respiration noted  Abdomen: Soft, gravid, appropriate for gestational age. Pain/Pressure: Absent     Pelvic:  Cervical exam deferred        Extremities: Normal range of motion.  Edema: None  Mental Status: Normal mood and affect. Normal behavior. Normal judgment and thought content.   Assessment   34 y.o. 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 R/O FGR-followed weekly with Doppler studies and antepartum testing  8/26 at 33wk1d: CGA 31wk1d  AFI 13   BPP 8/8   EFW 1501 gm (3#5oz)  <1st%. Normal Dopplers  MFMs recommend delivery at 36-37 weeks, BMZ prior to delivery      Previous Version   Supervision of high risk pregnancy, antepartum 06/17/2019 by Conard Novak, MD No   Overview Addendum 11/30/2019  1:47 PM by Vena Austria, MD    Clinic [facility] Prenatal Labs  Dating 11wk1d [place] Blood type: A/Negative/-- (03/10 1543)   Genetic Screen 1 Screen: neg   AFP: declines    Antibody:Negative (03/10 1543)  Anatomic [place] Normal Female 09/04/2019 Subsequent growth scan short long bones consistent with skeletal dysplasia such as achondroplasia  Rubella: 2.54 (  03/10 1543) Varicella: Immune  GTT  Early:  141, 3hr WNL             Third trimester 3-hr: 68 / 161 / 141 / 107 RPR: Non Reactive (03/10 1543)   Rhogam 10/30/2019 HBsAg: Negative (03/10 1543)   TDaP vaccine 11/30/2019 HIV: Non Reactive (03/10 1543)   Baby Food                                GBS:   Contraception Vasectomy Pap: 01/08/2018 NILM  CBB     CS/VBAC    Support Person Laron (spouse)          Previous Version   Chronic hypertension affecting pregnancy 06/17/2019 by Conard Novak, MD No   Overview Addendum 12/08/2019  2:02 PM by Nadara Mustard, MD    [ ]  Aspirin 81 mg daily after 12 weeks; discontinue after 36 weeks [x baseline labs with CBC, CMP, urine protein/creatinine ratio [x ] no BP meds unless BPs become elevated- on Labetalol [ ]  ultrasound for growth at 28, 32, 36 weeks [ ]  Aspirin 81 mg daily after 12 weeks; discontinue after 36 weeks. Started 5/18 [ ]  Baseline EKG   Current antihypertensives: 3/11 labetalol 200 mgm BID 3/25 labetalol increased to 300 mgm BID 08/25/19 dose increased to 400mg m BID Also, Procardia XL 60 mg daily  Baseline and surveillance labs (pulled in from [facility], refresh links as needed)  Lab Results  Component Value Date   PLT 384 06/17/2019   CREATININE 0.60 06/17/2019   AST 18 06/17/2019   ALT 18 06/17/2019    Antenatal Testing CHTN - O10.919  Group I  BP < 140/90, no preeclampsia, AGA,  nml AFV, +/- meds    Group II BP > 140/90, on meds, no preeclampsia, AGA, nml AFV  20-28-34-38  20-24-28-32-35-38  32//2 x wk  28//BPP wkly then 32//2 x wk  40 no meds; 39 meds  PRN or 37  Pre-eclampsia  GHTN - O13.9/Preeclampsia without severe features  - O14.00   Preeclampsia with severe features - O14.10  Q 3-4wks  Q 2 wks  28//BPP wkly then 32//2 x wk  Inpatient  37  PRN or 34        Previous Version   Obesity affecting pregnancy in third trimester 06/17/2019 by 08/17/2019, MD No   BMI 39.0-39.9,adult 06/17/2019 by 01-05-1987, MD No   Nausea and  vomiting during pregnancy 06/17/2019 by Conard Novak, MD 12/08/2019 by 08/17/2019, MD       Preterm labor symptoms and general obstetric precautions including but not limited to vaginal bleeding, contractions, leaking of fluid and fetal movement were reviewed in detail with the patient. IOL scheduled for 12/30/19 Covid swab 12/28/19   Return for scheduled follow up.  Conard Novak, CNM 12/16/2019 3:10 PM

## 2019-12-17 ENCOUNTER — Ambulatory Visit (HOSPITAL_BASED_OUTPATIENT_CLINIC_OR_DEPARTMENT_OTHER): Payer: Federal, State, Local not specified - PPO

## 2019-12-17 ENCOUNTER — Observation Stay
Admission: RE | Admit: 2019-12-17 | Discharge: 2019-12-17 | Disposition: A | Discharge status: Home / Self Care | Payer: Federal, State, Local not specified - PPO | Source: Ambulatory Visit | Attending: Obstetrics and Gynecology | Admitting: Obstetrics and Gynecology

## 2019-12-17 ENCOUNTER — Other Ambulatory Visit: Payer: Self-pay

## 2019-12-17 DIAGNOSIS — O10913 Unspecified pre-existing hypertension complicating pregnancy, third trimester: Secondary | ICD-10-CM | POA: Diagnosis not present

## 2019-12-17 DIAGNOSIS — O0990 Supervision of high risk pregnancy, unspecified, unspecified trimester: Secondary | ICD-10-CM

## 2019-12-17 DIAGNOSIS — Z7982 Long term (current) use of aspirin: Secondary | ICD-10-CM | POA: Insufficient documentation

## 2019-12-17 DIAGNOSIS — Z3A35 35 weeks gestation of pregnancy: Secondary | ICD-10-CM | POA: Insufficient documentation

## 2019-12-17 DIAGNOSIS — O358XX1 Maternal care for other (suspected) fetal abnormality and damage, fetus 1: Secondary | ICD-10-CM | POA: Insufficient documentation

## 2019-12-17 DIAGNOSIS — O360130 Maternal care for anti-D [Rh] antibodies, third trimester, not applicable or unspecified: Secondary | ICD-10-CM | POA: Insufficient documentation

## 2019-12-17 DIAGNOSIS — O98313 Other infections with a predominantly sexual mode of transmission complicating pregnancy, third trimester: Secondary | ICD-10-CM

## 2019-12-17 DIAGNOSIS — O0993 Supervision of high risk pregnancy, unspecified, third trimester: Secondary | ICD-10-CM | POA: Diagnosis not present

## 2019-12-17 DIAGNOSIS — O99213 Obesity complicating pregnancy, third trimester: Secondary | ICD-10-CM | POA: Insufficient documentation

## 2019-12-17 DIAGNOSIS — Z79899 Other long term (current) drug therapy: Secondary | ICD-10-CM | POA: Diagnosis not present

## 2019-12-17 DIAGNOSIS — E669 Obesity, unspecified: Secondary | ICD-10-CM | POA: Insufficient documentation

## 2019-12-17 DIAGNOSIS — A6004 Herpesviral vulvovaginitis: Secondary | ICD-10-CM

## 2019-12-17 DIAGNOSIS — O358XX0 Maternal care for other (suspected) fetal abnormality and damage, not applicable or unspecified: Secondary | ICD-10-CM

## 2019-12-17 DIAGNOSIS — E876 Hypokalemia: Secondary | ICD-10-CM | POA: Diagnosis not present

## 2019-12-17 DIAGNOSIS — O10919 Unspecified pre-existing hypertension complicating pregnancy, unspecified trimester: Secondary | ICD-10-CM | POA: Diagnosis not present

## 2019-12-17 DIAGNOSIS — O365910 Maternal care for other known or suspected poor fetal growth, first trimester, not applicable or unspecified: Secondary | ICD-10-CM

## 2019-12-17 DIAGNOSIS — Z6791 Unspecified blood type, Rh negative: Secondary | ICD-10-CM | POA: Diagnosis not present

## 2019-12-17 DIAGNOSIS — A6000 Herpesviral infection of urogenital system, unspecified: Secondary | ICD-10-CM | POA: Insufficient documentation

## 2019-12-17 DIAGNOSIS — O26893 Other specified pregnancy related conditions, third trimester: Secondary | ICD-10-CM | POA: Diagnosis present

## 2019-12-17 DIAGNOSIS — O99283 Endocrine, nutritional and metabolic diseases complicating pregnancy, third trimester: Secondary | ICD-10-CM

## 2019-12-17 DIAGNOSIS — Z6839 Body mass index (BMI) 39.0-39.9, adult: Secondary | ICD-10-CM

## 2019-12-17 DIAGNOSIS — O99013 Anemia complicating pregnancy, third trimester: Secondary | ICD-10-CM

## 2019-12-17 LAB — CBC WITH DIFFERENTIAL/PLATELET
Abs Immature Granulocytes: 0.1 10*3/uL — ABNORMAL HIGH (ref 0.00–0.07)
Basophils Absolute: 0 10*3/uL (ref 0.0–0.1)
Basophils Relative: 0 %
Eosinophils Absolute: 0.1 10*3/uL (ref 0.0–0.5)
Eosinophils Relative: 1 %
HCT: 31.9 % — ABNORMAL LOW (ref 36.0–46.0)
Hemoglobin: 9.9 g/dL — ABNORMAL LOW (ref 12.0–15.0)
Immature Granulocytes: 1 %
Lymphocytes Relative: 26 %
Lymphs Abs: 3.1 10*3/uL (ref 0.7–4.0)
MCH: 21.6 pg — ABNORMAL LOW (ref 26.0–34.0)
MCHC: 31 g/dL (ref 30.0–36.0)
MCV: 69.5 fL — ABNORMAL LOW (ref 80.0–100.0)
Monocytes Absolute: 0.8 10*3/uL (ref 0.1–1.0)
Monocytes Relative: 7 %
Neutro Abs: 8.1 10*3/uL — ABNORMAL HIGH (ref 1.7–7.7)
Neutrophils Relative %: 65 %
Platelets: 293 10*3/uL (ref 150–400)
RBC: 4.59 MIL/uL (ref 3.87–5.11)
RDW: 18.9 % — ABNORMAL HIGH (ref 11.5–15.5)
WBC: 12.3 10*3/uL — ABNORMAL HIGH (ref 4.0–10.5)
nRBC: 0 % (ref 0.0–0.2)

## 2019-12-17 LAB — COMPREHENSIVE METABOLIC PANEL
ALT: 9 U/L (ref 0–44)
AST: 15 U/L (ref 15–41)
Albumin: 2.8 g/dL — ABNORMAL LOW (ref 3.5–5.0)
Alkaline Phosphatase: 103 U/L (ref 38–126)
Anion gap: 9 (ref 5–15)
BUN: <5 mg/dL — ABNORMAL LOW (ref 6–20)
CO2: 22 mmol/L (ref 22–32)
Calcium: 8.2 mg/dL — ABNORMAL LOW (ref 8.9–10.3)
Chloride: 103 mmol/L (ref 98–111)
Creatinine, Ser: 0.42 mg/dL — ABNORMAL LOW (ref 0.44–1.00)
GFR calc Af Amer: >60 mL/min (ref >60)
GFR calc non Af Amer: >60 mL/min (ref >60)
Glucose, Bld: 110 mg/dL — ABNORMAL HIGH (ref 70–99)
Potassium: 3.1 mmol/L — ABNORMAL LOW (ref 3.5–5.1)
Sodium: 134 mmol/L — ABNORMAL LOW (ref 135–145)
Total Bilirubin: 0.8 mg/dL (ref 0.3–1.2)
Total Protein: 6.3 g/dL — ABNORMAL LOW (ref 6.5–8.1)

## 2019-12-17 LAB — PROTEIN / CREATININE RATIO, URINE
Creatinine, Urine: 165 mg/dL
Protein Creatinine Ratio: 0.12 mg/mg{Cre} (ref 0.00–0.15)
Total Protein, Urine: 19 mg/dL

## 2019-12-17 MED ORDER — LABETALOL HCL 5 MG/ML IV SOLN: 20.0000 mg | INTRAVENOUS | Status: DC | PRN

## 2019-12-17 MED ORDER — POTASSIUM CHLORIDE CRYS ER 20 MEQ PO TBCR: 40.0000 meq | EXTENDED_RELEASE_TABLET | Freq: Every day | ORAL | 0 refills | Status: DC

## 2019-12-17 MED ORDER — LABETALOL HCL 5 MG/ML IV SOLN: 40.0000 mg | INTRAVENOUS | Status: DC | PRN

## 2019-12-17 MED ORDER — HYDRALAZINE HCL 20 MG/ML IJ SOLN: 10.0000 mg | INTRAMUSCULAR | Status: DC | PRN

## 2019-12-17 MED ORDER — VALACYCLOVIR HCL 500 MG PO TABS: 500.0000 mg | ORAL_TABLET | Freq: Two times a day (BID) | ORAL | 0 refills | Status: DC

## 2019-12-17 MED ORDER — LABETALOL HCL 5 MG/ML IV SOLN: 80.0000 mg | INTRAVENOUS | Status: DC | PRN

## 2019-12-17 NOTE — Progress Notes (Unsigned)
Dr. Grace Bushy notified of pt's BP's for this visit.

## 2019-12-17 NOTE — Progress Notes (Unsigned)
1215-MFM spoke to Dr. Jerene Pitch on the phone about pt's elevated BP's during the [place] visit.  Dr. Grace Bushy sent pt to [facility] following appt for further evaluation.  Report given to Elyn Peers, RN on arrival to OBS 3.

## 2019-12-17 NOTE — OB Triage Note (Signed)
Pt sent up from MFM for further evaluation and Pre-eclampsia focused care. Elevated BP at doctors visit

## 2019-12-17 NOTE — H&P (Signed)
OB History & Physical   History of Present Illness:  Date of initial H&P: 12/17/2019 based on clinic visit 12/16/2019  Chief Complaint: planned induction of labor for chronic hypertension, IUGR, achondroplasia  HPI:  Jenna Sims is a 34 y.o. [id number] female at [redacted]w[redacted]d dated by 11 week ultrasound.  Her pregnancy has been complicated by chronic hypertension on anti-hypertensive medication, genital herpes, Rh negative, postpartum depression, skeletal dysplasia of fetus, anemia in third trimester, iron infusions, B12 deficiency, obesity.    She denies contractions.   She denies leakage of fluid.   She denies vaginal bleeding.   She reports fetal movement.    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 R/O FGR-followed weekly with Doppler studies and antepartum testing  8/26 at 33wk1d: CGA 31wk1d  AFI 13   BPP 8/8   EFW 1501 gm (3#5oz)  <1st%. Normal Dopplers  MFMs recommend delivery at 36-37 weeks, BMZ prior to delivery      Previous Version   Supervision of high risk pregnancy, antepartum 06/17/2019 by Conard Novak, MD No   Overview Addendum 11/30/2019  1:47 PM by Vena Austria, MD    Clinic [facility] Prenatal Labs  Dating 11wk1d [place] Blood type: A/Negative/-- (03/10 1543)   Genetic Screen 1 Screen: neg   AFP: declines    Antibody:Negative (03/10 1543)  Anatomic [place] Normal Female 09/04/2019 Subsequent growth scan short long bones consistent with skeletal  dysplasia such as achondroplasia  Rubella: 2.54 (03/10 1543) Varicella: Immune  GTT Early:  141, 3hr WNL             Third trimester 3-hr: 68 / 161 / 141 / 107 RPR: Non Reactive (03/10 1543)   Rhogam 10/30/2019 HBsAg: Negative (03/10 1543)   TDaP vaccine 11/30/2019 HIV: Non Reactive (03/10 1543)   Baby Food                                GBS: GBS UTI treat in labor GC/CT: neg/neg  Contraception Vasectomy Pap: 01/08/2018 NILM  CBB     CS/VBAC    Support Person Laron (spouse)          Previous Version   Chronic hypertension affecting pregnancy 06/17/2019 by Conard Novak, MD No   Overview Addendum 12/08/2019  2:02 PM by Nadara Mustard, MD    [ ]  Aspirin 81 mg daily after 12 weeks; discontinue after 36 weeks [x baseline labs with CBC, CMP, urine protein/creatinine ratio [x ] no BP meds unless BPs become elevated- on Labetalol [ ]  ultrasound for growth at 28, 32, 36 weeks [ ]  Aspirin 81 mg daily after 12 weeks; discontinue after 36 weeks. Started 5/18 [ ]  Baseline EKG   Current antihypertensives: 3/11 labetalol 200 mgm BID 3/25 labetalol increased to 300 mgm BID 08/25/19 dose increased to 400mg m BID Also, Procardia XL 60 mg daily  Baseline and surveillance labs (pulled in from , refresh links  as needed)  Lab Results  Component Value Date   PLT 384 06/17/2019   CREATININE 0.60 06/17/2019   AST 18 06/17/2019   ALT 18 06/17/2019    Antenatal Testing CHTN - O10.919  Group I  BP < 140/90, no preeclampsia, AGA,  nml AFV, +/- meds    Group II BP > 140/90, on meds, no preeclampsia, AGA, nml AFV  20-28-34-38  20-24-28-32-35-38  32//2 x wk  28//BPP wkly then 32//2 x wk  40 no meds; 39 meds  PRN or 37  Pre-eclampsia  GHTN - O13.9/Preeclampsia without severe features  - O14.00   Preeclampsia with severe features - O14.10  Q 3-4wks  Q 2 wks  28//BPP wkly then 32//2 x wk  Inpatient  37  PRN or 34        Previous Version   Obesity affecting pregnancy in third  trimester 06/17/2019 by Conard Novak, MD No   BMI 39.0-39.9,adult 06/17/2019 by Conard Novak, MD No   Nausea and vomiting during pregnancy 06/17/2019 by Conard Novak, MD 12/08/2019 by Nadara Mustard, MD       Maternal Medical History:   Past Medical History:  Diagnosis Date  . Abdominal pain   . Depression   . Heavy menstrual bleeding   . HSV-1 infection   . Hypertension   . Lower back pain     Past Surgical History:  Procedure Laterality Date  . DILATION AND CURETTAGE OF UTERUS    . LAPAROSCOPY N/A 11/18/2018   Procedure: LAPAROSCOPY DIAGNOSTIC;  Surgeon: Conard Novak, MD;  Location: [facility] ORS;  Service: Gynecology;  Laterality: N/A;  . WISDOM TOOTH EXTRACTION      No Known Allergies  Prior to Admission medications   Medication Sig Start Date End Date Taking? Authorizing Provider  aspirin 81 MG chewable tablet Chew by mouth daily.   Yes [provider]  labetalol (NORMODYNE) 100 MG tablet Take 400 mg by mouth 2 (two) times daily.  05/24/19 05/23/20 Yes [provider]  Prenatal Vit-Fe Fumarate-FA (MULTIVITAMIN-PRENATAL) 27-0.8 MG TABS tablet Take 1 tablet by mouth daily at 12 noon.   Yes [provider]  promethazine (PHENERGAN) 25 MG tablet Take 1 tablet (25 mg total) by mouth every 6 (six) hours as needed for nausea or vomiting. 06/17/19  Yes Conard Novak, MD  NIFEdipine (PROCARDIA XL) 60 MG 24 hr tablet Take 1 tablet (60 mg total) by mouth daily. 11/12/19 12/12/19  Conard Novak, MD  potassium chloride SA (KLOR-CON) 20 MEQ tablet Take 2 tablets (40 mEq total) by mouth daily. 12/17/19   Conard Novak, MD  valACYclovir (VALTREX) 500 MG tablet Take 1 tablet (500 mg total) by mouth 2 (two) times daily. 12/17/19 01/16/20  Conard Novak, MD    OB History  Gravida Para Term Preterm AB Living  4 2 2  0 1 2  SAB TAB Ectopic Multiple Live Births  1 0 0 0 2    # Outcome Date GA Lbr Len/2nd Weight Sex Delivery Anes PTL Lv  4  Current           3 Term 12/08/12 [redacted]w[redacted]d  7 lb 14 oz (3.572 kg) M Vag-Vacuum   LIV     Complications: Fetal Intolerance, Gestational hypertension  2 Term 11/02/06 [redacted]w[redacted]d  6 lb 6 oz (2.892 kg) M Vag-Spont   LIV  1 SAB             Prenatal care site: [facility]  Social History: She  reports that she has never smoked. She has never used smokeless tobacco. She reports previous alcohol use of about 1.0 standard drink of alcohol per week. She reports that she does not use drugs.  Family History: family history includes Diabetes in her maternal grandfather and paternal grandmother; Hypertension in her father and mother.    Review of Systems:  Review of Systems  Constitutional: Positive for malaise/fatigue. Negative for chills and fever.  HENT: Negative for congestion, ear discharge, ear pain, hearing loss, sinus pain and sore throat.   Eyes: Negative for blurred vision and double vision.  Respiratory: Negative for cough, shortness of breath and wheezing.   Cardiovascular: Negative for chest pain, palpitations and leg swelling.  Gastrointestinal: Negative for abdominal pain, blood in stool, constipation, diarrhea, heartburn, melena, nausea and vomiting.  Genitourinary: Negative for dysuria, flank pain, frequency, hematuria and urgency.  Musculoskeletal: Negative for back pain, joint pain and myalgias.  Skin: Negative for itching and rash.  Neurological: Negative for dizziness, tingling, tremors, sensory change, speech change, focal weakness, seizures, loss of consciousness, weakness and headaches.  Endo/Heme/Allergies: Negative for environmental allergies. Does not bruise/bleed easily.  Psychiatric/Behavioral: Negative for depression, hallucinations, memory loss, substance abuse and suicidal ideas. The patient is not nervous/anxious and does not have insomnia.      Physical Exam:  BP 130/80   Pulse (!) 102   Wt 222 lb (100.7 kg)   LMP 04/20/2019   BMI 38.11 kg/m   Constitutional:  Well nourished, well developed female in no acute distress.  HEENT: normal Skin: Warm and dry.  Cardiovascular: Regular rate and rhythm.   Extremity: no edema  Respiratory: Clear to auscultation bilateral. Normal respiratory effort Abdomen: FHT present Back: no CVAT Neuro: DTRs 2+, Cranial nerves grossly intact Psych: Alert and Oriented x3. No memory deficits. Normal mood and affect.  MS: normal gait, normal bilateral lower extremity ROM/strength/stability.  Pelvic exam: deferred   Baseline FHR: 140 beats/min   Variability: moderate   Accelerations: present   Decelerations: absent Contractions: not assessed in office, abdomen soft Overall assessment: reassuring   Lab Results  Component Value Date   SARSCOV2NAA NEGATIVE 11/14/2018   Updated Covid test pending  Assessment:  Jenna Sims is a 34 y.o. [phone] female at [redacted]w[redacted]d with chronic hypertension, IUGR, Achondroplasia, planned induction of labor at 37 weeks.   Plan:  1. Admit to Labor & Delivery  2. CBC, T&S, Clrs, IVF 3. GBS positive in urine: prophylaxis treat with penicillin.   4. Fetal well-being: Category I 5. Induction method pending admission cervical exam   [name], CNM 12/17/2019 3:26 PM

## 2019-12-17 NOTE — Final Progress Note (Signed)
Physician Final Progress Note  Patient ID: Jenna Sims MRN: [id number] DOB/AGE: [birth year] 33 y.o.  Admit date: 12/17/2019 Admitting provider: Conard Novak, MD Discharge date: 12/17/2019   Admission Diagnoses:  1) intrauterine pregnancy at [redacted]w[redacted]d  2) Chronic hypertension affecting pregnancy 3) intrauterine pregnancy at [redacted]w[redacted]d   Discharge Diagnoses:  Active Problems:   Supervision of high risk pregnancy, antepartum   Chronic hypertension affecting pregnancy   Obesity affecting pregnancy in third trimester   Genital herpes   Rh negative state in antepartum period, third trimester   Skeletal dysplasia of fetus affecting management of mother, antepartum   [redacted] weeks gestation of pregnancy   Hypokalemia    History of Present Illness: The patient is a 34 y.o. female [phone] at [redacted]w[redacted]d who presents for evaluation of blood pressure after being seen in MFM clinic today. Her blood pressure was 150s/90s.  She denies headache. Visual changes, and RUQ pain. SHe notes +FM , no lof and no vaginal bleeding or contractions.   Past Medical History:  Diagnosis Date  . Abdominal pain   . Depression   . Heavy menstrual bleeding   . HSV-1 infection   . Hypertension   . Lower back pain     Past Surgical History:  Procedure Laterality Date  . DILATION AND CURETTAGE OF UTERUS    . LAPAROSCOPY N/A 11/18/2018   Procedure: LAPAROSCOPY DIAGNOSTIC;  Surgeon: Conard Novak, MD;  Location: [facility] ORS;  Service: Gynecology;  Laterality: N/A;  . WISDOM TOOTH EXTRACTION      No current facility-administered medications on file prior to encounter.   Current Outpatient Medications on File Prior to Encounter  Medication Sig Dispense Refill  . aspirin 81 MG chewable tablet Chew by mouth daily.    Marland Kitchen labetalol (NORMODYNE) 100 MG tablet Take 400 mg by mouth 2 (two) times daily.     . Prenatal Vit-Fe Fumarate-FA (MULTIVITAMIN-PRENATAL) 27-0.8 MG TABS tablet Take 1 tablet by mouth daily at 12 noon.     . promethazine (PHENERGAN) 25 MG tablet Take 1 tablet (25 mg total) by mouth every 6 (six) hours as needed for nausea or vomiting. 30 tablet 2  . NIFEdipine (PROCARDIA XL) 60 MG 24 hr tablet Take 1 tablet (60 mg total) by mouth daily. 30 tablet 4    No Known Allergies  Social History   Socioeconomic History  . Marital status: Married    Spouse name: Not on file  . Number of children: Not on file  . Years of education: Not on file  . Highest education level: Not on file  Occupational History  . Occupation: Special educational needs teacher  Tobacco Use  . Smoking status: Never Smoker  . Smokeless tobacco: Never Used  Vaping Use  . Vaping Use: Never used  Substance and Sexual Activity  . Alcohol use: Not Currently    Alcohol/week: 1.0 standard drink    Types: 1 Glasses of wine per week  . Drug use: Never  . Sexual activity: Not Currently    Birth control/protection: None  Other Topics Concern  . Not on file  Social History Narrative  . Not on file   Social Determinants of Health   Financial Resource Strain:   . Difficulty of Paying Living Expenses: Not on file  Food Insecurity:   . Worried About Programme researcher, broadcasting/film/video in the Last Year: Not on file  . Ran Out of Food in the Last Year: Not on file  Transportation Needs:   . Lack of Transportation (  Medical): Not on file  . Lack of Transportation (Non-Medical): Not on file  Physical Activity:   . Days of Exercise per Week: Not on file  . Minutes of Exercise per Session: Not on file  Stress:   . Feeling of Stress : Not on file  Social Connections:   . Frequency of Communication with Friends and Family: Not on file  . Frequency of Social Gatherings with Friends and Family: Not on file  . Attends Religious Services: Not on file  . Active Member of Clubs or Organizations: Not on file  . Attends Banker Meetings: Not on file  . Marital Status: Not on file  Intimate Partner Violence:   . Fear of Current or Ex-Partner: Not  on file  . Emotionally Abused: Not on file  . Physically Abused: Not on file  . Sexually Abused: Not on file    Family History  Problem Relation Age of Onset  . Hypertension Mother   . Hypertension Father   . Diabetes Maternal Grandfather   . Diabetes Paternal Grandmother      Review of Systems  Constitutional: Negative.   HENT: Negative.   Eyes: Negative.   Respiratory: Negative.   Cardiovascular: Negative.   Gastrointestinal: Negative.   Genitourinary: Negative.   Musculoskeletal: Negative.   Skin: Negative.   Neurological: Negative.   Psychiatric/Behavioral: Negative.      Physical Exam: BP (!) 142/89   Pulse 91   Temp 99 F (37.2 C) (Oral)   LMP 04/20/2019   SpO2 99%   BPs 143/85, 138/80, 142/89, 133/84, 131/80 Physical Exam Constitutional:      General: She is not in acute distress.    Appearance: Normal appearance.  HENT:     Head: Normocephalic and atraumatic.  Eyes:     General: No scleral icterus.    Conjunctiva/sclera: Conjunctivae normal.  Neurological:     General: No focal deficit present.     Mental Status: She is alert and oriented to person, place, and time.     Cranial Nerves: No cranial nerve deficit.  Psychiatric:        Mood and Affect: Mood normal.        Behavior: Behavior normal.        Judgment: Judgment normal.    Consults: None  Significant Findings/ Diagnostic Studies:  Lab Results  Component Value Date   WBC 12.3 (H) 12/17/2019   HGB 9.9 (L) 12/17/2019   HCT 31.9 (L) 12/17/2019   PLT 293 12/17/2019   CREATININE 0.42 (L) 12/17/2019   ALT 9 12/17/2019   AST 15 12/17/2019   PROTCRRATIO 0.12 12/17/2019   Potassium: 3.1  Procedures: NST: Baseline FHR: 135 beats/min Variability: moderate Accelerations: present Decelerations: absent Tocometry: irritability  Interpretation:  INDICATIONS: chronic hypertension RESULTS:  A NST procedure was performed with FHR monitoring and a normal baseline established, appropriate time  of 20-40 minutes of evaluation, and accels >2 seen w 15x15 characteristics.  Results show a REACTIVE NST.    Hospital Course: The patient was admitted to Labor and Delivery Triage for observation. Her blood pressures were cycled and were within the normal range for her.  She had no concerning symptoms. The fetal tracing was reactive.  Her labs were reassuring.  She had no findings concerning for superimposed severe preeclampsia.  She was discharged with precautions and encouraged to keep her routine follow up.   Discharge Condition: stable  Disposition:  There are no questions and answers to display.  Diet: Regular diet  Discharge Activity: Activity as tolerated   Allergies as of 12/17/2019   No Known Allergies     Medication List    STOP taking these medications   multivitamin-prenatal 27-0.8 MG Tabs tablet   promethazine 25 MG tablet Commonly known as: PHENERGAN     TAKE these medications   aspirin 81 MG chewable tablet Chew by mouth daily.   labetalol 100 MG tablet Commonly known as: NORMODYNE Take 400 mg by mouth 2 (two) times daily.   NIFEdipine 60 MG 24 hr tablet Commonly known as: Procardia XL Take 1 tablet (60 mg total) by mouth daily.   potassium chloride SA 20 MEQ tablet Commonly known as: KLOR-CON Take 2 tablets (40 mEq total) by mouth daily.   valACYclovir 500 MG tablet Commonly known as: Valtrex Take 1 tablet (500 mg total) by mouth 2 (two) times daily.        Total time spent taking care of this patient: 30 minutes  Signed: Thomasene Mohair, MD  12/17/2019, 2:58 PM

## 2019-12-17 NOTE — Discharge Summary (Signed)
See Final Progress note 

## 2019-12-21 ENCOUNTER — Other Ambulatory Visit: Payer: Self-pay | Admitting: Obstetrics and Gynecology

## 2019-12-21 DIAGNOSIS — O99213 Obesity complicating pregnancy, third trimester: Secondary | ICD-10-CM

## 2019-12-21 DIAGNOSIS — O10919 Unspecified pre-existing hypertension complicating pregnancy, unspecified trimester: Secondary | ICD-10-CM

## 2019-12-21 DIAGNOSIS — O0990 Supervision of high risk pregnancy, unspecified, unspecified trimester: Secondary | ICD-10-CM

## 2019-12-21 DIAGNOSIS — O365910 Maternal care for other known or suspected poor fetal growth, first trimester, not applicable or unspecified: Secondary | ICD-10-CM

## 2019-12-21 DIAGNOSIS — O99013 Anemia complicating pregnancy, third trimester: Secondary | ICD-10-CM

## 2019-12-22 ENCOUNTER — Other Ambulatory Visit (HOSPITAL_COMMUNITY)
Admission: RE | Admit: 2019-12-22 | Discharge: 2019-12-22 | Disposition: A | Discharge status: Home / Self Care | Payer: Federal, State, Local not specified - PPO | Source: Ambulatory Visit | Attending: Obstetrics and Gynecology | Admitting: Obstetrics and Gynecology

## 2019-12-22 ENCOUNTER — Other Ambulatory Visit: Payer: Self-pay

## 2019-12-22 ENCOUNTER — Ambulatory Visit (INDEPENDENT_AMBULATORY_CARE_PROVIDER_SITE_OTHER): Payer: Federal, State, Local not specified - PPO | Admitting: Obstetrics and Gynecology

## 2019-12-22 ENCOUNTER — Encounter: Payer: Self-pay | Admitting: Obstetrics and Gynecology

## 2019-12-22 VITALS — BP 118/72 | Ht 61.0 in | Wt 221.0 lb

## 2019-12-22 DIAGNOSIS — O0990 Supervision of high risk pregnancy, unspecified, unspecified trimester: Secondary | ICD-10-CM | POA: Insufficient documentation

## 2019-12-22 DIAGNOSIS — O99213 Obesity complicating pregnancy, third trimester: Secondary | ICD-10-CM

## 2019-12-22 DIAGNOSIS — Z3A35 35 weeks gestation of pregnancy: Secondary | ICD-10-CM | POA: Diagnosis not present

## 2019-12-22 DIAGNOSIS — Z630 Problems in relationship with spouse or partner: Secondary | ICD-10-CM | POA: Diagnosis not present

## 2019-12-22 DIAGNOSIS — O10919 Unspecified pre-existing hypertension complicating pregnancy, unspecified trimester: Secondary | ICD-10-CM

## 2019-12-22 LAB — POCT URINALYSIS DIPSTICK OB
Glucose, UA: NEGATIVE
POC,PROTEIN,UA: NEGATIVE

## 2019-12-22 NOTE — Progress Notes (Signed)
Routine Prenatal Care Visit  Subjective  Jenna Sims is a 34 y.o. [phone] at [redacted]w[redacted]d being seen today for ongoing prenatal care.  She is currently monitored for the following issues for this high-risk pregnancy and has Menorrhagia with irregular cycle; Adenomyosis; Chronic pelvic pain in female; Supervision of high risk pregnancy, antepartum; Chronic hypertension affecting pregnancy; Obesity affecting pregnancy in third trimester; BMI 39.0-39.9,adult; Essential hypertension in pregnancy; Genital herpes; Hypertension; Postpartum depression; Rh negative state in antepartum period, third trimester; Vitamin D deficiency; Skeletal dysplasia of fetus affecting management of mother, antepartum; Anemia affecting pregnancy in third trimester; Iron deficiency anemia; B12 deficiency; [redacted] weeks gestation of pregnancy; and Hypokalemia on their problem list.  ----------------------------------------------------------------------------------- Patient reports no complaints.   Contractions: Not present. Vag. Bleeding: None.  Movement: Present. Denies leaking of fluid.  ----------------------------------------------------------------------------------- The following portions of the patient's history were reviewed and updated as appropriate: allergies, current medications, past family history, past medical history, past social history, past surgical history and problem list. Problem list updated.   Objective  Blood pressure 118/72, height 5\' 1"  (1.549 m), weight 221 lb (100.2 kg), last menstrual period 04/20/2019, unknown if currently breastfeeding. Pregravid weight 210 lb (95.3 kg) Total Weight Gain 11 lb (4.99 kg) Urinalysis:      Fetal Status: Fetal Heart Rate (bpm): 140   Movement: Present  Presentation: Vertex  General:  Alert, oriented and cooperative. Patient is in no acute distress.  Skin: Skin is warm and dry. No rash noted.   Cardiovascular: Normal heart rate noted  Respiratory: Normal  respiratory effort, no problems with respiration noted  Abdomen: Soft, gravid, appropriate for gestational age. Pain/Pressure: Absent     Pelvic:  Cervical exam deferred Dilation: Fingertip Effacement (%): 50 Station: -3  Extremities: Normal range of motion.     Mental Status: Normal mood and affect. Normal behavior. Normal judgment and thought content.     Assessment   34 y.o. 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 R/O FGR-followed weekly with Doppler studies and antepartum testing  8/26 at 33wk1d: CGA 31wk1d  AFI 13   BPP 8/8   EFW 1501 gm (3#5oz)  <1st%. Normal Dopplers  MFMs recommend delivery at 36-37 weeks, BMZ prior to delivery      Previous Version   Supervision of high risk pregnancy, antepartum 06/17/2019 by 08/17/2019, MD No   Overview Addendum 12/22/2019 10:39 AM by 12/24/2019, MD    Clinic [facility] Prenatal Labs  Dating 11wk1d Natale Milch Blood type: A/Negative/-- (03/10 1543)   Genetic Screen 1 Screen: neg   AFP: declines    Antibody:Negative (03/10 1543)  Anatomic 08-01-2005 Normal Female 09/04/2019 Subsequent growth scan short long bones consistent with skeletal dysplasia such as achondroplasia  Rubella: 2.54 (03/10 1543) Varicella: Immune  GTT Early:  141, 3hr WNL             Third trimester 3-hr: 68 / 161 / 141 / 107 RPR: Non  Reactive (03/10 1543)   Rhogam 10/30/2019 HBsAg: Negative (03/10 1543)   TDaP vaccine 11/30/2019 HIV:  Non Reactive (03/10 1543)   Baby Food                                GBS:   Contraception Vasectomy Pap: 01/08/2018 NILM  CBB     CS/VBAC    Support Person Laron (spouse)    High risk diagnoses:  Obesity in pregnancy  Chronic hypertension in pregnancy History of genital herpes in pregnancy Growth restriction in fetus      Previous Version   Chronic hypertension affecting pregnancy 06/17/2019 by Conard Novak, MD No   Overview Addendum 12/08/2019  2:02 PM by Nadara Mustard, MD    [ ]  Aspirin 81 mg daily after 12 weeks; discontinue after 36 weeks [x baseline labs with CBC, CMP, urine protein/creatinine ratio [x ] no BP meds unless BPs become elevated- on Labetalol [ ]  ultrasound for growth at 28, 32, 36 weeks [ ]  Aspirin 81 mg daily after 12 weeks; discontinue after 36 weeks. Started 5/18 [ ]  Baseline EKG   Current antihypertensives: 3/11 labetalol 200 mgm BID 3/25 labetalol increased to 300 mgm BID 08/25/19 dose increased to 400mg m BID Also, Procardia XL 60 mg daily  Baseline and surveillance labs (pulled in from [facility], refresh links as needed)  Lab Results  Component Value Date   PLT 384 06/17/2019   CREATININE 0.60 06/17/2019   AST 18 06/17/2019   ALT 18 06/17/2019    Antenatal Testing CHTN - O10.919  Group I  BP < 140/90, no preeclampsia, AGA,  nml AFV, +/- meds    Group II BP > 140/90, on meds, no preeclampsia, AGA, nml AFV  20-28-34-38  20-24-28-32-35-38  32//2 x wk  28//BPP wkly then 32//2 x wk  40 no meds; 39 meds  PRN or 37  Pre-eclampsia  GHTN - O13.9/Preeclampsia without severe features  - O14.00   Preeclampsia with severe features - O14.10  Q 3-4wks  Q 2 wks  28//BPP wkly then 32//2 x wk  Inpatient  37  PRN or 34        Previous Version   Obesity affecting pregnancy in third trimester 06/17/2019 by 08/17/2019, MD No   BMI 39.0-39.9,adult 06/17/2019 by 01-[birth year]-1989, MD No   Nausea and vomiting during pregnancy 06/17/2019 by  Conard Novak, MD 12/08/2019 by 08/17/2019, MD       Will plan for BMZ prior to IOL- scheduled for Monday and Tuesday of next week.   Gestational age appropriate obstetric precautions including but not limited to vaginal bleeding, contractions, leaking of fluid and fetal movement were reviewed in detail with the patient.    Return in about 1 week (around 12/29/2019) for Monday or tuesday for ROB/ NST.  Thursday MD [facility], [facility] 12/22/2019, 10:58 AM

## 2019-12-22 NOTE — Patient Instructions (Addendum)
Pain Relief During Labor and Delivery Many things can cause pain during labor and delivery, including:  Pressure on bones and ligaments due to the baby moving through the pelvis.  Stretching of tissues due to the baby moving through the birth canal.  Muscle tension due to anxiety or nervousness.  The uterus tightening (contracting) and relaxing to help move the baby. There are many ways to deal with the pain of labor and delivery. They include:  Taking prenatal classes. Taking these classes helps you know what to expect during your baby's birth. What you learn will increase your confidence and decrease your anxiety.  Practicing relaxation techniques or doing relaxing activities, such as: ? Focused breathing. ? Meditation. ? Visualization. ? Aroma therapy. ? Listening to your favorite music. ? Hypnosis.  Taking a warm shower or bath (hydrotherapy). This may: ? Provide comfort and relaxation. ? Lessen your perception of pain. ? Decrease the amount of pain medicine needed. ? Decrease the length of labor.  Getting a massage or counterpressure on your back.  Applying warm packs or ice packs.  Changing positions often, moving around, or using a birthing ball.  Getting: ? Pain medicine through an IV or injection into a muscle. ? Pain medicine inserted into your spinal column. ? Injections of sterile water just under the skin on your lower back (intradermal injections). ? Laughing gas (nitrous oxide). Discuss your pain control options with your health care provider during your prenatal visits. Explore the options offered by your hospital or birth center. What kinds of medicine are available? There are two kinds of medicines that can be used to relieve pain during labor and delivery:  Analgesics. These medicines decrease pain without causing you to lose feeling or the ability to move your muscles.  Anesthetics. These medicines block feeling in the body and can decrease your  ability to move freely. Both of these kinds of medicine can cause minor side effects, such as nausea, trouble concentrating, and sleepiness. They can also decrease the baby's heart rate before birth and affect the baby's breathing rate after birth. For this reason, health care providers are careful about when and how much medicine is given. What are specific medicines and procedures that provide pain relief? Local Anesthetics Local anesthetics are used to numb a small area of the body. They may be used along with another kind of anesthetic or used to numb the nerves of the vagina, cervix, and perineum during the second stage of labor. General Anesthetics General anesthetics cause you to lose consciousness so you do not feel pain. They are usually only used for an emergency cesarean delivery. General anesthetics are given through an IV tube and a mask. Pudendal Block A pudendal block is a form of local anesthetic. It may be used to relieve the pain associated with pushing or stretching of the perineum at the time of delivery or to further numb the perineum. A pudendal block is done by injecting numbing medicine through the vaginal wall into a nerve in the pelvis. Epidural Analgesia Epidural analgesia is given through a flexible IV catheter that is inserted into the lower back. Numbing medicine is delivered continuously to the area near your spinal column nerves (epidural space). After having this type of analgesia, you may be able to move your legs but you most likely will not be able to walk. Depending on the amount of medicine given, you may lose all feeling in the lower half of your body, or you may retain some  level of sensation, including the urge to push. Epidural analgesia can be used to provide pain relief for a vaginal birth. Spinal Block A spinal block is similar to epidural analgesia, but the medicine is injected into the spinal fluid instead of the epidural space. A spinal block is only given  once. It starts to relieve pain quickly, but the pain relief lasts only 1-6 hours. Spinal blocks can be used for cesarean deliveries. Combined Spinal-Epidural (CSE) Block A CSE block combines the effects of a spinal block and epidural analgesia. The spinal block works quickly to block all pain. The epidural analgesia provides continuous pain relief, even after the effects of the spinal block have worn off. This information is not intended to replace advice given to you by your health care provider. Make sure you discuss any questions you have with your health care provider. Document Revised: 03/08/2017 Document Reviewed: 08/17/2015 Elsevier Patient Education  2020 [employer].   Labor Induction  Labor induction is when steps are taken to cause a pregnant woman to begin the labor process. Most women go into labor on their own between 37 weeks and 42 weeks of pregnancy. When this does not happen or when there is a medical need for labor to begin, steps may be taken to induce labor. Labor induction causes a pregnant woman's uterus to contract. It also causes the cervix to soften (ripen), open (dilate), and thin out (efface). Usually, labor is not induced before 39 weeks of pregnancy unless there is a medical reason to do so. Your health care provider will determine if labor induction is needed. Before inducing labor, your health care provider will consider a number of factors, including:  Your medical condition and your baby's.  How many weeks along you are in your pregnancy.  How mature your baby's lungs are.  The condition of your cervix.  The position of your baby.  The size of your birth canal. What are some reasons for labor induction? Labor may be induced if:  Your health or your baby's health is at risk.  Your pregnancy is overdue by 1 week or more.  Your water breaks but labor does not start on its own.  There is a low amount of amniotic fluid around your baby. You may also  choose (elect) to have labor induced at a certain time. Generally, elective labor induction is done no earlier than 39 weeks of pregnancy. What methods are used for labor induction? Methods used for labor induction include:  Prostaglandin medicine. This medicine starts contractions and causes the cervix to dilate and ripen. It can be taken by mouth (orally) or by being inserted into the vagina (suppository).  Inserting a small, thin tube (catheter) with a balloon into the vagina and then expanding the balloon with water to dilate the cervix.  Stripping the membranes. In this method, your health care provider gently separates amniotic sac tissue from the cervix. This causes the cervix to stretch, which in turn causes the release of a hormone called progesterone. The hormone causes the uterus to contract. This procedure is often done during an office visit, after which you will be sent home to wait for contractions to begin.  Breaking the water. In this method, your health care provider uses a small instrument to make a small hole in the amniotic sac. This eventually causes the amniotic sac to break. Contractions should begin after a few hours.  Medicine to trigger or strengthen contractions. This medicine is given through an IV  that is inserted into a vein in your arm. Except for membrane stripping, which can be done in a clinic, labor induction is done in the hospital so that you and your baby can be carefully monitored. How long does it take for labor to be induced? The length of time it takes to induce labor depends on how ready your body is for labor. Some inductions can take up to 2-3 days, while others may take less than a day. Induction may take longer if:  You are induced early in your pregnancy.  It is your first pregnancy.  Your cervix is not ready. What are some risks associated with labor induction? Some risks associated with labor induction include:  Changes in fetal heart rate,  such as being too high, too low, or irregular (erratic).  Failed induction.  Infection in the mother or the baby.  Increased risk of having a cesarean delivery.  Fetal death.  Breaking off (abruption) of the placenta from the uterus (rare).  Rupture of the uterus (very rare). When induction is needed for medical reasons, the benefits of induction generally outweigh the risks. What are some reasons for not inducing labor? Labor induction should not be done if:  Your baby does not tolerate contractions.  You have had previous surgeries on your uterus, such as a myomectomy, removal of fibroids, or a vertical scar from a previous cesarean delivery.  Your placenta lies very low in your uterus and blocks the opening of the cervix (placenta previa).  Your baby is not in a head-down position.  The umbilical cord drops down into the birth canal in front of the baby.  There are unusual circumstances, such as the baby being very early (premature).  You have had more than 2 previous cesarean deliveries. Summary  Labor induction is when steps are taken to cause a pregnant woman to begin the labor process.  Labor induction causes a pregnant woman's uterus to contract. It also causes the cervix to ripen, dilate, and efface.  Labor is not induced before 39 weeks of pregnancy unless there is a medical reason to do so.  When induction is needed for medical reasons, the benefits of induction generally outweigh the risks. This information is not intended to replace advice given to you by your health care provider. Make sure you discuss any questions you have with your health care provider. Document Revised: 03/29/2017 Document Reviewed: 05/09/2016 Elsevier Patient Education  2020 [employer]   Vaginal Delivery  Vaginal delivery means that you give birth by pushing your baby out of your birth canal (vagina). A team of health care providers will help you before, during, and after vaginal  delivery. Birth experiences are unique for every woman and every pregnancy, and birth experiences vary depending on where you choose to give birth. What happens when I arrive at the birth center or hospital? Once you are in labor and have been admitted into the hospital or birth center, your health care provider may:  Review your pregnancy history and any concerns that you have.  Insert an IV into one of your veins. This may be used to give you fluids and medicines.  Check your blood pressure, pulse, temperature, and heart rate (vital signs).  Check whether your bag of water (amniotic sac) has broken (ruptured).  Talk with you about your birth plan and discuss pain control options. Monitoring Your health care provider may monitor your contractions (uterine monitoring) and your baby's heart rate (fetal monitoring). You may need  to be monitored:  Often, but not continuously (intermittently).  All the time or for long periods at a time (continuously). Continuous monitoring may be needed if: ? You are taking certain medicines, such as medicine to relieve pain or make your contractions stronger. ? You have pregnancy or labor complications. Monitoring may be done by:  Placing a special stethoscope or a handheld monitoring device on your abdomen to check your baby's heartbeat and to check for contractions.  Placing monitors on your abdomen (external monitors) to record your baby's heartbeat and the frequency and length of contractions.  Placing monitors inside your uterus through your vagina (internal monitors) to record your baby's heartbeat and the frequency, length, and strength of your contractions. Depending on the type of monitor, it may remain in your uterus or on your baby's head until birth.  Telemetry. This is a type of continuous monitoring that can be done with external or internal monitors. Instead of having to stay in bed, you are able to move around during telemetry. Physical  exam Your health care provider may perform frequent physical exams. This may include:  Checking how and where your baby is positioned in your uterus.  Checking your cervix to determine: ? Whether it is thinning out (effacing). ? Whether it is opening up (dilating). What happens during labor and delivery?  Normal labor and delivery is divided into the following three stages: Stage 1  This is the longest stage of labor.  This stage can last for hours or days.  Throughout this stage, you will feel contractions. Contractions generally feel mild, infrequent, and irregular at first. They get stronger, more frequent (about every 2-3 minutes), and more regular as you move through this stage.  This stage ends when your cervix is completely dilated to 4 inches (10 cm) and completely effaced. Stage 2  This stage starts once your cervix is completely effaced and dilated and lasts until the delivery of your baby.  This stage may last from 20 minutes to 2 hours.  This is the stage where you will feel an urge to push your baby out of your vagina.  You may feel stretching and burning pain, especially when the widest part of your baby's head passes through the vaginal opening (crowning).  Once your baby is delivered, the umbilical cord will be clamped and cut. This usually occurs after waiting a period of 1-2 minutes after delivery.  Your baby will be placed on your bare chest (skin-to-skin contact) in an upright position and covered with a warm blanket. Watch your baby for feeding cues, like rooting or sucking, and help the baby to your breast for his or her first feeding. Stage 3  This stage starts immediately after the birth of your baby and ends after you deliver the placenta.  This stage may take anywhere from 5 to 30 minutes.  After your baby has been delivered, you will feel contractions as your body expels the placenta and your uterus contracts to control bleeding. What can I expect  after labor and delivery?  After labor is over, you and your baby will be monitored closely until you are ready to go home to ensure that you are both healthy. Your health care team will teach you how to care for yourself and your baby.  You and your baby will stay in the same room (rooming in) during your hospital stay. This will encourage early bonding and successful breastfeeding.  You may continue to receive fluids and medicines through  an IV.  Your uterus will be checked and massaged regularly (fundal massage).  You will have some soreness and pain in your abdomen, vagina, and the area of skin between your vaginal opening and your anus (perineum).  If an incision was made near your vagina (episiotomy) or if you had some vaginal tearing during delivery, cold compresses may be placed on your episiotomy or your tear. This helps to reduce pain and swelling.  You may be given a squirt bottle to use instead of wiping when you go to the bathroom. To use the squirt bottle, follow these steps: ? Before you urinate, fill the squirt bottle with warm water. Do not use hot water. ? After you urinate, while you are sitting on the toilet, use the squirt bottle to rinse the area around your urethra and vaginal opening. This rinses away any urine and blood. ? Fill the squirt bottle with clean water every time you use the bathroom.  It is normal to have vaginal bleeding after delivery. Wear a sanitary pad for vaginal bleeding and discharge. Summary  Vaginal delivery means that you will give birth by pushing your baby out of your birth canal (vagina).  Your health care provider may monitor your contractions (uterine monitoring) and your baby's heart rate (fetal monitoring).  Your health care provider may perform a physical exam.  Normal labor and delivery is divided into three stages.  After labor is over, you and your baby will be monitored closely until you are ready to go home. This information  is not intended to replace advice given to you by your health care provider. Make sure you discuss any questions you have with your health care provider. Document Revised: 04/30/2017 Document Reviewed: 04/30/2017 Elsevier Patient Education  2020 [employer].   Third Trimester of Pregnancy The third trimester is from week 28 through week 40 (months 7 through 9). The third trimester is a time when the unborn baby (fetus) is growing rapidly. At the end of the ninth month, the fetus is about 20 inches in length and weighs 6-10 pounds. Body changes during your third trimester Your body will continue to go through many changes during pregnancy. The changes vary from woman to woman. During the third trimester:  Your weight will continue to increase. You can expect to gain 25-35 pounds (11-16 kg) by the end of the pregnancy.  You may begin to get stretch marks on your hips, abdomen, and breasts.  You may urinate more often because the fetus is moving lower into your pelvis and pressing on your bladder.  You may develop or continue to have heartburn. This is caused by increased hormones that slow down muscles in the digestive tract.  You may develop or continue to have constipation because increased hormones slow digestion and cause the muscles that push waste through your intestines to relax.  You may develop hemorrhoids. These are swollen veins (varicose veins) in the rectum that can itch or be painful.  You may develop swollen, bulging veins (varicose veins) in your legs.  You may have increased body aches in the pelvis, back, or thighs. This is due to weight gain and increased hormones that are relaxing your joints.  You may have changes in your hair. These can include thickening of your hair, rapid growth, and changes in texture. Some women also have hair loss during or after pregnancy, or hair that feels dry or thin. Your hair will most likely return to normal after your baby is  born.  Your breasts will continue to grow and they will continue to become tender. A yellow fluid (colostrum) may leak from your breasts. This is the first milk you are producing for your baby.  Your belly button may stick out.  You may notice more swelling in your hands, face, or ankles.  You may have increased tingling or numbness in your hands, arms, and legs. The skin on your belly may also feel numb.  You may feel short of breath because of your expanding uterus.  You may have more problems sleeping. This can be caused by the size of your belly, increased need to urinate, and an increase in your body's metabolism.  You may notice the fetus "dropping," or moving lower in your abdomen (lightening).  You may have increased vaginal discharge.  You may notice your joints feel loose and you may have pain around your pelvic bone. What to expect at prenatal visits You will have prenatal exams every 2 weeks until week 36. Then you will have weekly prenatal exams. During a routine prenatal visit:  You will be weighed to make sure you and the baby are growing normally.  Your blood pressure will be taken.  Your abdomen will be measured to track your baby's growth.  The fetal heartbeat will be listened to.  Any test results from the previous visit will be discussed.  You may have a cervical check near your due date to see if your cervix has softened or thinned (effaced).  You will be tested for Group B streptococcus. This happens between 35 and 37 weeks. Your health care provider may ask you:  What your birth plan is.  How you are feeling.  If you are feeling the baby move.  If you have had any abnormal symptoms, such as leaking fluid, bleeding, severe headaches, or abdominal cramping.  If you are using any tobacco products, including cigarettes, chewing tobacco, and electronic cigarettes.  If you have any questions. Other tests or screenings that may be performed during your  third trimester include:  Blood tests that check for low iron levels (anemia).  Fetal testing to check the health, activity level, and growth of the fetus. Testing is done if you have certain medical conditions or if there are problems during the pregnancy.  Nonstress test (NST). This test checks the health of your baby to make sure there are no signs of problems, such as the baby not getting enough oxygen. During this test, a belt is placed around your belly. The baby is made to move, and its heart rate is monitored during movement. What is false labor? False labor is a condition in which you feel small, irregular tightenings of the muscles in the womb (contractions) that usually go away with rest, changing position, or drinking water. These are called Braxton Hicks contractions. Contractions may last for hours, days, or even weeks before true labor sets in. If contractions come at regular intervals, become more frequent, increase in intensity, or become painful, you should see your health care provider. What are the signs of labor?  Abdominal cramps.  Regular contractions that start at 10 minutes apart and become stronger and more frequent with time.  Contractions that start on the top of the uterus and spread down to the lower abdomen and back.  Increased pelvic pressure and dull back pain.  A watery or bloody mucus discharge that comes from the vagina.  Leaking of amniotic fluid. This is also known as your "water breaking."  It could be a slow trickle or a gush. Let your health care provider know if it has a color or strange odor. If you have any of these signs, call your health care provider right away, even if it is before your due date. Follow these instructions at home: Medicines  Follow your health care provider's instructions regarding medicine use. Specific medicines may be either safe or unsafe to take during pregnancy.  Take a prenatal vitamin that contains at least 600  micrograms (mcg) of folic acid.  If you develop constipation, try taking a stool softener if your health care provider approves. Eating and drinking   Eat a balanced diet that includes fresh fruits and vegetables, whole grains, good sources of protein such as meat, eggs, or tofu, and low-fat dairy. Your health care provider will help you determine the amount of weight gain that is right for you.  Avoid raw meat and uncooked cheese. These carry germs that can cause birth defects in the baby.  If you have low calcium intake from food, talk to your health care provider about whether you should take a daily calcium supplement.  Eat four or five small meals rather than three large meals a day.  Limit foods that are high in fat and processed sugars, such as fried and sweet foods.  To prevent constipation: ? Drink enough fluid to keep your urine clear or pale yellow. ? Eat foods that are high in fiber, such as fresh fruits and vegetables, whole grains, and beans. Activity  Exercise only as directed by your health care provider. Most women can continue their usual exercise routine during pregnancy. Try to exercise for 30 minutes at least 5 days a week. Stop exercising if you experience uterine contractions.  Avoid heavy lifting.  Do not exercise in extreme heat or humidity, or at high altitudes.  Wear low-heel, comfortable shoes.  Practice good posture.  You may continue to have sex unless your health care provider tells you otherwise. Relieving pain and discomfort  Take frequent breaks and rest with your legs elevated if you have leg cramps or low back pain.  Take warm sitz baths to soothe any pain or discomfort caused by hemorrhoids. Use hemorrhoid cream if your health care provider approves.  Wear a good support bra to prevent discomfort from breast tenderness.  If you develop varicose veins: ? Wear support pantyhose or compression stockings as told by your healthcare  provider. ? Elevate your feet for 15 minutes, 3-4 times a day. Prenatal care  Write down your questions. Take them to your prenatal visits.  Keep all your prenatal visits as told by your health care provider. This is important. Safety  Wear your seat belt at all times when driving.  Make a list of emergency phone numbers, including numbers for family, friends, the hospital, and police and fire departments. General instructions  Avoid cat litter boxes and soil used by cats. These carry germs that can cause birth defects in the baby. If you have a cat, ask someone to clean the litter box for you.  Do not travel far distances unless it is absolutely necessary and only with the approval of your health care provider.  Do not use hot tubs, steam rooms, or saunas.  Do not drink alcohol.  Do not use any products that contain nicotine or tobacco, such as cigarettes and e-cigarettes. If you need help quitting, ask your health care provider.  Do not use any medicinal herbs or unprescribed drugs. These  chemicals affect the formation and growth of the baby.  Do not douche or use tampons or scented sanitary pads.  Do not cross your legs for long periods of time.  To prepare for the arrival of your baby: ? Take prenatal classes to understand, practice, and ask questions about labor and delivery. ? Make a trial run to the hospital. ? Visit the hospital and tour the maternity area. ? Arrange for maternity or paternity leave through employers. ? Arrange for family and friends to take care of pets while you are in the hospital. ? Purchase a rear-facing car seat and make sure you know how to install it in your car. ? Pack your hospital bag. ? Prepare the baby's nursery. Make sure to remove all pillows and stuffed animals from the baby's crib to prevent suffocation.  Visit your dentist if you have not gone during your pregnancy. Use a soft toothbrush to brush your teeth and be gentle when you  floss. Contact a health care provider if:  You are unsure if you are in labor or if your water has broken.  You become dizzy.  You have mild pelvic cramps, pelvic pressure, or nagging pain in your abdominal area.  You have lower back pain.  You have persistent nausea, vomiting, or diarrhea.  You have an unusual or bad smelling vaginal discharge.  You have pain when you urinate. Get help right away if:  Your water breaks before 37 weeks.  You have regular contractions less than 5 minutes apart before 37 weeks.  You have a fever.  You are leaking fluid from your vagina.  You have spotting or bleeding from your vagina.  You have severe abdominal pain or cramping.  You have rapid weight loss or weight gain.  You have shortness of breath with chest pain.  You notice sudden or extreme swelling of your face, hands, ankles, feet, or legs.  Your baby makes fewer than 10 movements in 2 hours.  You have severe headaches that do not go away when you take medicine.  You have vision changes. Summary  The third trimester is from week 28 through week 40, months 7 through 9. The third trimester is a time when the unborn baby (fetus) is growing rapidly.  During the third trimester, your discomfort may increase as you and your baby continue to gain weight. You may have abdominal, leg, and back pain, sleeping problems, and an increased need to urinate.  During the third trimester your breasts will keep growing and they will continue to become tender. A yellow fluid (colostrum) may leak from your breasts. This is the first milk you are producing for your baby.  False labor is a condition in which you feel small, irregular tightenings of the muscles in the womb (contractions) that eventually go away. These are called Braxton Hicks contractions. Contractions may last for hours, days, or even weeks before true labor sets in.  Signs of labor can include: abdominal cramps; regular  contractions that start at 10 minutes apart and become stronger and more frequent with time; watery or bloody mucus discharge that comes from the vagina; increased pelvic pressure and dull back pain; and leaking of amniotic fluid. This information is not intended to replace advice given to you by your health care provider. Make sure you discuss any questions you have with your health care provider. Document Revised: 07/17/2018 Document Reviewed: 05/01/2016 Elsevier Patient Education  2020 [employer].

## 2019-12-23 ENCOUNTER — Other Ambulatory Visit: Payer: Self-pay

## 2019-12-23 ENCOUNTER — Inpatient Hospital Stay: Payer: Federal, State, Local not specified - PPO

## 2019-12-23 VITALS — BP 136/83 | HR 90 | Temp 96.0°F | Resp 18

## 2019-12-23 DIAGNOSIS — E538 Deficiency of other specified B group vitamins: Secondary | ICD-10-CM

## 2019-12-23 DIAGNOSIS — D509 Iron deficiency anemia, unspecified: Secondary | ICD-10-CM

## 2019-12-23 LAB — CERVICOVAGINAL ANCILLARY ONLY
Chlamydia: NEGATIVE
Comment: NEGATIVE
Comment: NORMAL
Neisseria Gonorrhea: NEGATIVE

## 2019-12-23 LAB — HEMOGLOBIN AND HEMATOCRIT, BLOOD
HCT: 35.2 % — ABNORMAL LOW (ref 36.0–46.0)
Hemoglobin: 10.7 g/dL — ABNORMAL LOW (ref 12.0–15.0)

## 2019-12-23 MED ORDER — CYANOCOBALAMIN 1000 MCG/ML IJ SOLN
1000.0000 ug | Freq: Once | INTRAMUSCULAR | Status: AC
  Administered 2019-12-23: 1000 ug via INTRAMUSCULAR
  Filled 2019-12-23: qty 1

## 2019-12-23 MED ORDER — SODIUM CHLORIDE 0.9 % IV SOLN
Freq: Once | INTRAVENOUS | Status: AC
  Filled 2019-12-23: qty 250

## 2019-12-23 MED ORDER — SODIUM CHLORIDE 0.9 % IV SOLN: 200.0000 mg | Freq: Once | INTRAVENOUS | Status: DC

## 2019-12-23 MED ORDER — IRON SUCROSE 20 MG/ML IV SOLN
200.0000 mg | Freq: Once | INTRAVENOUS | Status: AC
  Administered 2019-12-23: 200 mg via INTRAVENOUS
  Filled 2019-12-23: qty 10

## 2019-12-24 ENCOUNTER — Other Ambulatory Visit: Payer: Self-pay

## 2019-12-24 ENCOUNTER — Ambulatory Visit: Payer: Federal, State, Local not specified - PPO | Attending: Obstetrics

## 2019-12-24 DIAGNOSIS — O99213 Obesity complicating pregnancy, third trimester: Secondary | ICD-10-CM | POA: Diagnosis not present

## 2019-12-24 DIAGNOSIS — E669 Obesity, unspecified: Secondary | ICD-10-CM | POA: Insufficient documentation

## 2019-12-24 DIAGNOSIS — O10019 Pre-existing essential hypertension complicating pregnancy, unspecified trimester: Secondary | ICD-10-CM

## 2019-12-24 DIAGNOSIS — O0990 Supervision of high risk pregnancy, unspecified, unspecified trimester: Secondary | ICD-10-CM | POA: Diagnosis not present

## 2019-12-24 DIAGNOSIS — O10013 Pre-existing essential hypertension complicating pregnancy, third trimester: Secondary | ICD-10-CM

## 2019-12-24 DIAGNOSIS — Z3A36 36 weeks gestation of pregnancy: Secondary | ICD-10-CM | POA: Diagnosis not present

## 2019-12-24 DIAGNOSIS — O358XX0 Maternal care for other (suspected) fetal abnormality and damage, not applicable or unspecified: Secondary | ICD-10-CM | POA: Diagnosis not present

## 2019-12-24 DIAGNOSIS — O365910 Maternal care for other known or suspected poor fetal growth, first trimester, not applicable or unspecified: Secondary | ICD-10-CM

## 2019-12-24 DIAGNOSIS — O99013 Anemia complicating pregnancy, third trimester: Secondary | ICD-10-CM

## 2019-12-24 DIAGNOSIS — D649 Anemia, unspecified: Secondary | ICD-10-CM | POA: Insufficient documentation

## 2019-12-24 DIAGNOSIS — O10913 Unspecified pre-existing hypertension complicating pregnancy, third trimester: Secondary | ICD-10-CM | POA: Insufficient documentation

## 2019-12-24 DIAGNOSIS — Z6839 Body mass index (BMI) 39.0-39.9, adult: Secondary | ICD-10-CM

## 2019-12-24 DIAGNOSIS — O10919 Unspecified pre-existing hypertension complicating pregnancy, unspecified trimester: Secondary | ICD-10-CM

## 2019-12-24 DIAGNOSIS — Z3A35 35 weeks gestation of pregnancy: Secondary | ICD-10-CM

## 2019-12-26 LAB — CULTURE, BETA STREP (GROUP B ONLY): Strep Gp B Culture: NEGATIVE

## 2019-12-28 ENCOUNTER — Other Ambulatory Visit
Admission: RE | Admit: 2019-12-28 | Discharge: 2019-12-28 | Disposition: A | Discharge status: Home / Self Care | Payer: Federal, State, Local not specified - PPO | Source: Ambulatory Visit | Attending: Obstetrics and Gynecology | Admitting: Obstetrics and Gynecology

## 2019-12-28 ENCOUNTER — Other Ambulatory Visit: Payer: Self-pay

## 2019-12-28 ENCOUNTER — Inpatient Hospital Stay
Admission: RE | Admit: 2019-12-28 | Discharge: 2019-12-28 | Disposition: A | Discharge status: Home / Self Care | Payer: Federal, State, Local not specified - PPO | Source: Ambulatory Visit | Attending: Obstetrics | Admitting: Obstetrics

## 2019-12-28 DIAGNOSIS — O99214 Obesity complicating childbirth: Secondary | ICD-10-CM | POA: Diagnosis not present

## 2019-12-28 DIAGNOSIS — O1002 Pre-existing essential hypertension complicating childbirth: Secondary | ICD-10-CM | POA: Diagnosis not present

## 2019-12-28 DIAGNOSIS — O365930 Maternal care for other known or suspected poor fetal growth, third trimester, not applicable or unspecified: Secondary | ICD-10-CM | POA: Diagnosis not present

## 2019-12-28 DIAGNOSIS — Z20822 Contact with and (suspected) exposure to covid-19: Secondary | ICD-10-CM | POA: Insufficient documentation

## 2019-12-28 DIAGNOSIS — O9081 Anemia of the puerperium: Secondary | ICD-10-CM | POA: Diagnosis not present

## 2019-12-28 DIAGNOSIS — O99824 Streptococcus B carrier state complicating childbirth: Secondary | ICD-10-CM | POA: Diagnosis not present

## 2019-12-28 DIAGNOSIS — O09893 Supervision of other high risk pregnancies, third trimester: Secondary | ICD-10-CM | POA: Insufficient documentation

## 2019-12-28 DIAGNOSIS — Z01812 Encounter for preprocedural laboratory examination: Secondary | ICD-10-CM | POA: Insufficient documentation

## 2019-12-28 DIAGNOSIS — Z3A37 37 weeks gestation of pregnancy: Secondary | ICD-10-CM | POA: Insufficient documentation

## 2019-12-28 DIAGNOSIS — O26893 Other specified pregnancy related conditions, third trimester: Secondary | ICD-10-CM | POA: Diagnosis not present

## 2019-12-28 DIAGNOSIS — Z6791 Unspecified blood type, Rh negative: Secondary | ICD-10-CM | POA: Diagnosis not present

## 2019-12-28 DIAGNOSIS — D62 Acute posthemorrhagic anemia: Secondary | ICD-10-CM | POA: Diagnosis not present

## 2019-12-28 MED ORDER — BETAMETHASONE SOD PHOS & ACET 6 (3-3) MG/ML IJ SUSP
12.0000 mg | INTRAMUSCULAR | Status: DC
  Administered 2019-12-28: 12 mg via INTRAMUSCULAR

## 2019-12-28 NOTE — Progress Notes (Signed)
Pt presents to L&D for scheduled betamethasone injection. Pt received first dose of BMZ 12mg  at 1207. Pt will return tomorrow 12/29/2019 for second dose of BMZ.

## 2019-12-29 ENCOUNTER — Inpatient Hospital Stay
Admission: RE | Admit: 2019-12-29 | Discharge: 2019-12-29 | Disposition: A | Discharge status: Home / Self Care | Payer: Federal, State, Local not specified - PPO | Source: Home / Self Care | Attending: Obstetrics & Gynecology | Admitting: Obstetrics & Gynecology

## 2019-12-29 DIAGNOSIS — Z3A35 35 weeks gestation of pregnancy: Secondary | ICD-10-CM | POA: Insufficient documentation

## 2019-12-29 LAB — SARS CORONAVIRUS 2 (TAT 6-24 HRS): SARS Coronavirus 2: NEGATIVE

## 2019-12-29 MED ORDER — BETAMETHASONE SOD PHOS & ACET 6 (3-3) MG/ML IJ SUSP
12.0000 mg | INTRAMUSCULAR | Status: DC
  Administered 2019-12-29: 12 mg via INTRAMUSCULAR

## 2019-12-29 MED ORDER — BETAMETHASONE SOD PHOS & ACET 6 (3-3) MG/ML IJ SUSP
INTRAMUSCULAR | Status: AC
  Filled 2019-12-29: qty 5

## 2019-12-30 ENCOUNTER — Inpatient Hospital Stay: Payer: Federal, State, Local not specified - PPO | Admitting: Certified Registered Nurse Anesthetist

## 2019-12-30 ENCOUNTER — Other Ambulatory Visit: Payer: Self-pay

## 2019-12-30 ENCOUNTER — Inpatient Hospital Stay: Payer: Federal, State, Local not specified - PPO

## 2019-12-30 ENCOUNTER — Encounter: Payer: Self-pay | Admitting: Advanced Practice Midwife

## 2019-12-30 ENCOUNTER — Inpatient Hospital Stay
Admission: RE | Admit: 2019-12-30 | Discharge: 2020-01-01 | DRG: 787 | Disposition: A | Discharge status: Home / Self Care | Payer: Federal, State, Local not specified - PPO | Attending: Obstetrics and Gynecology | Admitting: Obstetrics and Gynecology

## 2019-12-30 ENCOUNTER — Encounter
Admission: RE | Disposition: A | Discharge status: Home / Self Care | Payer: Self-pay | Source: Home / Self Care | Attending: Obstetrics and Gynecology

## 2019-12-30 DIAGNOSIS — Z6839 Body mass index (BMI) 39.0-39.9, adult: Secondary | ICD-10-CM

## 2019-12-30 DIAGNOSIS — O99013 Anemia complicating pregnancy, third trimester: Secondary | ICD-10-CM

## 2019-12-30 DIAGNOSIS — Z3A37 37 weeks gestation of pregnancy: Secondary | ICD-10-CM | POA: Diagnosis not present

## 2019-12-30 DIAGNOSIS — O99824 Streptococcus B carrier state complicating childbirth: Secondary | ICD-10-CM | POA: Diagnosis present

## 2019-12-30 DIAGNOSIS — Z3490 Encounter for supervision of normal pregnancy, unspecified, unspecified trimester: Secondary | ICD-10-CM | POA: Diagnosis present

## 2019-12-30 DIAGNOSIS — G8918 Other acute postprocedural pain: Secondary | ICD-10-CM | POA: Diagnosis not present

## 2019-12-30 DIAGNOSIS — D62 Acute posthemorrhagic anemia: Secondary | ICD-10-CM | POA: Diagnosis not present

## 2019-12-30 DIAGNOSIS — O26893 Other specified pregnancy related conditions, third trimester: Secondary | ICD-10-CM | POA: Diagnosis present

## 2019-12-30 DIAGNOSIS — O99214 Obesity complicating childbirth: Secondary | ICD-10-CM | POA: Diagnosis present

## 2019-12-30 DIAGNOSIS — Z6791 Unspecified blood type, Rh negative: Secondary | ICD-10-CM | POA: Diagnosis not present

## 2019-12-30 DIAGNOSIS — O1002 Pre-existing essential hypertension complicating childbirth: Secondary | ICD-10-CM | POA: Diagnosis present

## 2019-12-30 DIAGNOSIS — O365931 Maternal care for other known or suspected poor fetal growth, third trimester, fetus 1: Secondary | ICD-10-CM | POA: Diagnosis not present

## 2019-12-30 DIAGNOSIS — O365910 Maternal care for other known or suspected poor fetal growth, first trimester, not applicable or unspecified: Secondary | ICD-10-CM | POA: Diagnosis present

## 2019-12-30 DIAGNOSIS — O99213 Obesity complicating pregnancy, third trimester: Secondary | ICD-10-CM

## 2019-12-30 DIAGNOSIS — O9081 Anemia of the puerperium: Secondary | ICD-10-CM | POA: Diagnosis not present

## 2019-12-30 DIAGNOSIS — O24429 Gestational diabetes mellitus in childbirth, unspecified control: Secondary | ICD-10-CM | POA: Diagnosis not present

## 2019-12-30 DIAGNOSIS — O365930 Maternal care for other known or suspected poor fetal growth, third trimester, not applicable or unspecified: Secondary | ICD-10-CM | POA: Diagnosis present

## 2019-12-30 DIAGNOSIS — Z20822 Contact with and (suspected) exposure to covid-19: Secondary | ICD-10-CM | POA: Diagnosis present

## 2019-12-30 DIAGNOSIS — R1084 Generalized abdominal pain: Secondary | ICD-10-CM | POA: Diagnosis not present

## 2019-12-30 DIAGNOSIS — Z3A35 35 weeks gestation of pregnancy: Secondary | ICD-10-CM

## 2019-12-30 DIAGNOSIS — O10919 Unspecified pre-existing hypertension complicating pregnancy, unspecified trimester: Secondary | ICD-10-CM

## 2019-12-30 DIAGNOSIS — O0990 Supervision of high risk pregnancy, unspecified, unspecified trimester: Secondary | ICD-10-CM

## 2019-12-30 LAB — COMPREHENSIVE METABOLIC PANEL
ALT: 9 U/L (ref 0–44)
AST: 19 U/L (ref 15–41)
Albumin: 2.7 g/dL — ABNORMAL LOW (ref 3.5–5.0)
Alkaline Phosphatase: 97 U/L (ref 38–126)
Anion gap: 12 (ref 5–15)
BUN: 7 mg/dL (ref 6–20)
CO2: 20 mmol/L — ABNORMAL LOW (ref 22–32)
Calcium: 8.8 mg/dL — ABNORMAL LOW (ref 8.9–10.3)
Chloride: 104 mmol/L (ref 98–111)
Creatinine, Ser: 0.66 mg/dL (ref 0.44–1.00)
GFR calc Af Amer: >60 mL/min (ref >60)
GFR calc non Af Amer: >60 mL/min (ref >60)
Glucose, Bld: 164 mg/dL — ABNORMAL HIGH (ref 70–99)
Potassium: 3.3 mmol/L — ABNORMAL LOW (ref 3.5–5.1)
Sodium: 136 mmol/L (ref 135–145)
Total Bilirubin: 0.6 mg/dL (ref 0.3–1.2)
Total Protein: 5.9 g/dL — ABNORMAL LOW (ref 6.5–8.1)

## 2019-12-30 LAB — CBC
HCT: 31 % — ABNORMAL LOW (ref 36.0–46.0)
Hemoglobin: 9.5 g/dL — ABNORMAL LOW (ref 12.0–15.0)
MCH: 22.8 pg — ABNORMAL LOW (ref 26.0–34.0)
MCHC: 30.6 g/dL (ref 30.0–36.0)
MCV: 74.3 fL — ABNORMAL LOW (ref 80.0–100.0)
Platelets: 244 10*3/uL (ref 150–400)
RBC: 4.17 MIL/uL (ref 3.87–5.11)
RDW: 21.9 % — ABNORMAL HIGH (ref 11.5–15.5)
WBC: 20.3 10*3/uL — ABNORMAL HIGH (ref 4.0–10.5)
nRBC: 0 % (ref 0.0–0.2)

## 2019-12-30 LAB — PROTEIN / CREATININE RATIO, URINE
Creatinine, Urine: 156 mg/dL
Protein Creatinine Ratio: 0.16 mg/mg{Cre} — ABNORMAL HIGH (ref 0.00–0.15)
Total Protein, Urine: 25 mg/dL

## 2019-12-30 LAB — TYPE AND SCREEN
ABO/RH(D): A NEG
Antibody Screen: POSITIVE

## 2019-12-30 SURGERY — Surgical Case
Anesthesia: Spinal

## 2019-12-30 MED ORDER — SODIUM CHLORIDE 0.9% FLUSH: 3.0000 mL | INTRAVENOUS | Status: DC | PRN

## 2019-12-30 MED ORDER — NALBUPHINE HCL 10 MG/ML IJ SOLN: 5.0000 mg | INTRAMUSCULAR | Status: DC | PRN

## 2019-12-30 MED ORDER — KETOROLAC TROMETHAMINE 30 MG/ML IJ SOLN
30.0000 mg | Freq: Four times a day (QID) | INTRAMUSCULAR | Status: AC
  Administered 2019-12-30 – 2019-12-31 (×4): 30 mg via INTRAVENOUS
  Filled 2019-12-30 (×4): qty 1

## 2019-12-30 MED ORDER — NALOXONE HCL 4 MG/10ML IJ SOLN
1.0000 ug/kg/h | INTRAVENOUS | Status: DC | PRN
  Filled 2019-12-30: qty 5

## 2019-12-30 MED ORDER — FERROUS SULFATE 325 (65 FE) MG PO TABS
325.0000 mg | ORAL_TABLET | Freq: Two times a day (BID) | ORAL | Status: DC
  Administered 2019-12-31 – 2020-01-01 (×3): 325 mg via ORAL
  Filled 2019-12-30 (×3): qty 1

## 2019-12-30 MED ORDER — LIDOCAINE HCL (PF) 1 % IJ SOLN
30.0000 mL | INTRAMUSCULAR | Status: DC | PRN
  Filled 2019-12-30: qty 30

## 2019-12-30 MED ORDER — ENOXAPARIN SODIUM 40 MG/0.4ML ~~LOC~~ SOLN
40.0000 mg | SUBCUTANEOUS | Status: DC
  Administered 2019-12-31: 40 mg via SUBCUTANEOUS
  Filled 2019-12-30: qty 0.4

## 2019-12-30 MED ORDER — OXYCODONE-ACETAMINOPHEN 5-325 MG PO TABS: 1.0000 | ORAL_TABLET | ORAL | Status: DC | PRN

## 2019-12-30 MED ORDER — OXYTOCIN-SODIUM CHLORIDE 30-0.9 UT/500ML-% IV SOLN
1.0000 m[IU]/min | INTRAVENOUS | Status: DC
  Filled 2019-12-30: qty 500

## 2019-12-30 MED ORDER — ONDANSETRON HCL 4 MG/2ML IJ SOLN: 4.0000 mg | Freq: Once | INTRAMUSCULAR | Status: DC | PRN

## 2019-12-30 MED ORDER — ONDANSETRON HCL 4 MG/2ML IJ SOLN: 4.0000 mg | Freq: Three times a day (TID) | INTRAMUSCULAR | Status: DC | PRN

## 2019-12-30 MED ORDER — FENTANYL CITRATE (PF) 100 MCG/2ML IJ SOLN
INTRAMUSCULAR | Status: AC
  Filled 2019-12-30: qty 2

## 2019-12-30 MED ORDER — TERBUTALINE SULFATE 1 MG/ML IJ SOLN
0.2500 mg | Freq: Once | INTRAMUSCULAR | Status: AC | PRN
  Administered 2019-12-30: 0.25 mg via SUBCUTANEOUS
  Filled 2019-12-30: qty 1

## 2019-12-30 MED ORDER — OXYCODONE HCL 5 MG PO TABS: 5.0000 mg | ORAL_TABLET | ORAL | Status: DC | PRN

## 2019-12-30 MED ORDER — MEPERIDINE HCL 25 MG/ML IJ SOLN: 6.2500 mg | INTRAMUSCULAR | Status: DC | PRN

## 2019-12-30 MED ORDER — BUPIVACAINE 0.25 % ON-Q PUMP DUAL CATH 400 ML
400.0000 mL | INJECTION | Status: DC
  Filled 2019-12-30: qty 400

## 2019-12-30 MED ORDER — BUPIVACAINE HCL (PF) 0.5 % IJ SOLN: 5.0000 mL | Freq: Once | INTRAMUSCULAR | Status: DC

## 2019-12-30 MED ORDER — OXYTOCIN-SODIUM CHLORIDE 30-0.9 UT/500ML-% IV SOLN
2.5000 [IU]/h | INTRAVENOUS | Status: DC
  Administered 2019-12-30: 2.5 [IU]/h via INTRAVENOUS
  Filled 2019-12-30: qty 500

## 2019-12-30 MED ORDER — ACETAMINOPHEN 500 MG PO TABS
1000.0000 mg | ORAL_TABLET | Freq: Four times a day (QID) | ORAL | Status: AC
  Administered 2019-12-30 – 2019-12-31 (×4): 1000 mg via ORAL
  Filled 2019-12-30 (×4): qty 2

## 2019-12-30 MED ORDER — LACTATED RINGERS IV SOLN: 500.0000 mL | INTRAVENOUS | Status: DC | PRN

## 2019-12-30 MED ORDER — ACETAMINOPHEN 325 MG PO TABS: 650.0000 mg | ORAL_TABLET | ORAL | Status: DC | PRN

## 2019-12-30 MED ORDER — FENTANYL CITRATE (PF) 100 MCG/2ML IJ SOLN: 25.0000 ug | INTRAMUSCULAR | Status: DC | PRN

## 2019-12-30 MED ORDER — OXYTOCIN-SODIUM CHLORIDE 30-0.9 UT/500ML-% IV SOLN
2.5000 [IU]/h | INTRAVENOUS | Status: DC
  Administered 2019-12-30: 30 [IU] via INTRAVENOUS
  Filled 2019-12-30: qty 500

## 2019-12-30 MED ORDER — DIPHENHYDRAMINE HCL 25 MG PO CAPS: 25.0000 mg | ORAL_CAPSULE | Freq: Four times a day (QID) | ORAL | Status: DC | PRN

## 2019-12-30 MED ORDER — AMMONIA AROMATIC IN INHA
RESPIRATORY_TRACT | Status: AC
  Filled 2019-12-30: qty 10

## 2019-12-30 MED ORDER — MORPHINE SULFATE (PF) 0.5 MG/ML IJ SOLN
INTRAMUSCULAR | Status: DC | PRN
Start: 2019-12-30 — End: 2019-12-30
  Administered 2019-12-30: .1 mg via INTRATHECAL

## 2019-12-30 MED ORDER — ONDANSETRON HCL 4 MG/2ML IJ SOLN: 4.0000 mg | Freq: Four times a day (QID) | INTRAMUSCULAR | Status: DC | PRN

## 2019-12-30 MED ORDER — MISOPROSTOL 100 MCG PO TABS
25.0000 ug | ORAL_TABLET | ORAL | Status: DC
  Administered 2019-12-30: 25 ug via BUCCAL
  Filled 2019-12-30 (×5): qty 1

## 2019-12-30 MED ORDER — OXYCODONE-ACETAMINOPHEN 5-325 MG PO TABS: 2.0000 | ORAL_TABLET | ORAL | Status: DC | PRN

## 2019-12-30 MED ORDER — LACTATED RINGERS IV SOLN: INTRAVENOUS | Status: DC

## 2019-12-30 MED ORDER — SODIUM CHLORIDE 0.9 % IV SOLN
5.0000 10*6.[IU] | Freq: Once | INTRAVENOUS | Status: AC
  Administered 2019-12-30: 5 10*6.[IU] via INTRAVENOUS
  Filled 2019-12-30: qty 5

## 2019-12-30 MED ORDER — DIPHENHYDRAMINE HCL 25 MG PO CAPS: 25.0000 mg | ORAL_CAPSULE | ORAL | Status: DC | PRN

## 2019-12-30 MED ORDER — NALBUPHINE HCL 10 MG/ML IJ SOLN: 5.0000 mg | Freq: Once | INTRAMUSCULAR | Status: AC | PRN

## 2019-12-30 MED ORDER — SENNOSIDES-DOCUSATE SODIUM 8.6-50 MG PO TABS
2.0000 | ORAL_TABLET | ORAL | Status: DC
  Administered 2019-12-30 – 2019-12-31 (×2): 2 via ORAL
  Filled 2019-12-30 (×2): qty 2

## 2019-12-30 MED ORDER — FENTANYL CITRATE (PF) 100 MCG/2ML IJ SOLN
INTRAMUSCULAR | Status: DC | PRN
Start: 2019-12-30 — End: 2019-12-30
  Administered 2019-12-30: 15 ug via INTRATHECAL

## 2019-12-30 MED ORDER — MISOPROSTOL 200 MCG PO TABS
ORAL_TABLET | ORAL | Status: AC
  Filled 2019-12-30: qty 4

## 2019-12-30 MED ORDER — NALBUPHINE HCL 10 MG/ML IJ SOLN
5.0000 mg | INTRAMUSCULAR | Status: DC | PRN
  Administered 2019-12-30: 5 mg via INTRAVENOUS
  Filled 2019-12-30: qty 1

## 2019-12-30 MED ORDER — DIBUCAINE (PERIANAL) 1 % EX OINT: 1.0000 "application " | TOPICAL_OINTMENT | CUTANEOUS | Status: DC | PRN

## 2019-12-30 MED ORDER — MISOPROSTOL 25 MCG QUARTER TABLET
25.0000 ug | ORAL_TABLET | ORAL | Status: DC | PRN
  Administered 2019-12-30: 25 ug via VAGINAL
  Filled 2019-12-30: qty 1

## 2019-12-30 MED ORDER — NIFEDIPINE ER OSMOTIC RELEASE 30 MG PO TB24
60.0000 mg | ORAL_TABLET | Freq: Every day | ORAL | Status: DC
  Administered 2019-12-31 – 2020-01-01 (×2): 60 mg via ORAL
  Filled 2019-12-30 (×3): qty 2

## 2019-12-30 MED ORDER — MENTHOL 3 MG MT LOZG
1.0000 | LOZENGE | OROMUCOSAL | Status: DC | PRN
  Filled 2019-12-30: qty 9

## 2019-12-30 MED ORDER — MORPHINE SULFATE (PF) 0.5 MG/ML IJ SOLN
INTRAMUSCULAR | Status: AC
  Filled 2019-12-30: qty 10

## 2019-12-30 MED ORDER — IBUPROFEN 600 MG PO TABS
600.0000 mg | ORAL_TABLET | Freq: Four times a day (QID) | ORAL | Status: DC
  Administered 2019-12-31 (×2): 600 mg via ORAL
  Filled 2019-12-30 (×2): qty 1

## 2019-12-30 MED ORDER — OXYTOCIN 10 UNIT/ML IJ SOLN
INTRAMUSCULAR | Status: AC
  Filled 2019-12-30: qty 2

## 2019-12-30 MED ORDER — BUPIVACAINE HCL (PF) 0.5 % IJ SOLN
5.0000 mL | Freq: Once | INTRAMUSCULAR | Status: DC
  Filled 2019-12-30: qty 30

## 2019-12-30 MED ORDER — SOD CITRATE-CITRIC ACID 500-334 MG/5ML PO SOLN: 30.0000 mL | ORAL | Status: AC

## 2019-12-30 MED ORDER — PENICILLIN G POT IN DEXTROSE 60000 UNIT/ML IV SOLN: 3.0000 10*6.[IU] | INTRAVENOUS | Status: DC

## 2019-12-30 MED ORDER — PRENATAL MULTIVITAMIN CH
1.0000 | ORAL_TABLET | Freq: Every day | ORAL | Status: DC
  Administered 2019-12-31: 1 via ORAL
  Filled 2019-12-30: qty 1

## 2019-12-30 MED ORDER — COCONUT OIL OIL: 1.0000 "application " | TOPICAL_OIL | Status: DC | PRN

## 2019-12-30 MED ORDER — BUPIVACAINE HCL (PF) 0.5 % IJ SOLN
INTRAMUSCULAR | Status: DC | PRN
  Administered 2019-12-30: 10 mL

## 2019-12-30 MED ORDER — DIPHENHYDRAMINE HCL 50 MG/ML IJ SOLN: 12.5000 mg | INTRAMUSCULAR | Status: DC | PRN

## 2019-12-30 MED ORDER — OXYTOCIN BOLUS FROM INFUSION: 333.0000 mL | Freq: Once | INTRAVENOUS | Status: DC

## 2019-12-30 MED ORDER — SOD CITRATE-CITRIC ACID 500-334 MG/5ML PO SOLN
ORAL | Status: AC
  Administered 2019-12-30: 30 mL via ORAL
  Filled 2019-12-30: qty 15

## 2019-12-30 MED ORDER — BUTORPHANOL TARTRATE 1 MG/ML IJ SOLN: 1.0000 mg | INTRAMUSCULAR | Status: DC | PRN

## 2019-12-30 MED ORDER — NALBUPHINE HCL 10 MG/ML IJ SOLN
5.0000 mg | Freq: Once | INTRAMUSCULAR | Status: AC | PRN
  Administered 2019-12-30: 5 mg via INTRAVENOUS

## 2019-12-30 MED ORDER — SIMETHICONE 80 MG PO CHEW
80.0000 mg | CHEWABLE_TABLET | Freq: Three times a day (TID) | ORAL | Status: DC
  Administered 2019-12-30 – 2020-01-01 (×5): 80 mg via ORAL
  Filled 2019-12-30 (×5): qty 1

## 2019-12-30 MED ORDER — NALOXONE HCL 0.4 MG/ML IJ SOLN: 0.4000 mg | INTRAMUSCULAR | Status: DC | PRN

## 2019-12-30 MED ORDER — INFLUENZA VAC SPLIT QUAD 0.5 ML IM SUSY
0.5000 mL | PREFILLED_SYRINGE | INTRAMUSCULAR | Status: DC | PRN
  Filled 2019-12-30: qty 0.5

## 2019-12-30 MED ORDER — SCOPOLAMINE 1 MG/3DAYS TD PT72: 1.0000 | MEDICATED_PATCH | Freq: Once | TRANSDERMAL | Status: DC

## 2019-12-30 MED ORDER — BUPIVACAINE IN DEXTROSE 0.75-8.25 % IT SOLN
INTRATHECAL | Status: DC | PRN
  Administered 2019-12-30: 1.4 mL via INTRATHECAL

## 2019-12-30 MED ORDER — CEFAZOLIN SODIUM-DEXTROSE 2-4 GM/100ML-% IV SOLN
2.0000 g | INTRAVENOUS | Status: AC
  Administered 2019-12-30: 2 g via INTRAVENOUS
  Filled 2019-12-30 (×2): qty 100

## 2019-12-30 MED ORDER — LIDOCAINE HCL (PF) 1 % IJ SOLN
INTRAMUSCULAR | Status: DC | PRN
  Administered 2019-12-30: 3 mL via SUBCUTANEOUS

## 2019-12-30 MED ORDER — WITCH HAZEL-GLYCERIN EX PADS: 1.0000 "application " | MEDICATED_PAD | CUTANEOUS | Status: DC | PRN

## 2019-12-30 MED ORDER — SODIUM CHLORIDE 0.9 % IV SOLN
INTRAVENOUS | Status: DC | PRN
  Administered 2019-12-30: 50 ug/min via INTRAVENOUS

## 2019-12-30 MED ORDER — NALBUPHINE HCL 10 MG/ML IJ SOLN
INTRAMUSCULAR | Status: AC
  Filled 2019-12-30: qty 1

## 2019-12-30 SURGICAL SUPPLY — 33 items
CANISTER SUCT 3000ML PPV (MISCELLANEOUS) ×2 IMPLANT
CATH KIT ON-Q SILVERSOAK 5IN (CATHETERS) ×4 IMPLANT
COVER WAND RF STERILE (DRAPES) ×2 IMPLANT
DERMABOND ADHESIVE PROPEN (GAUZE/BANDAGES/DRESSINGS) ×1
DERMABOND ADVANCED (GAUZE/BANDAGES/DRESSINGS) ×1
DERMABOND ADVANCED .7 DNX12 (GAUZE/BANDAGES/DRESSINGS) ×1 IMPLANT
DERMABOND ADVANCED .7 DNX6 (GAUZE/BANDAGES/DRESSINGS) ×1 IMPLANT
DRSG OPSITE POSTOP 4X10 (GAUZE/BANDAGES/DRESSINGS) ×2 IMPLANT
DRSG TELFA 3X8 NADH (GAUZE/BANDAGES/DRESSINGS) ×2 IMPLANT
ELECT CAUTERY BLADE 6.4 (BLADE) ×2 IMPLANT
ELECT REM PT RETURN 9FT ADLT (ELECTROSURGICAL) ×2
ELECTRODE REM PT RTRN 9FT ADLT (ELECTROSURGICAL) ×1 IMPLANT
GAUZE SPONGE 4X4 12PLY STRL (GAUZE/BANDAGES/DRESSINGS) ×2 IMPLANT
GLOVE BIO SURGEON STRL SZ7 (GLOVE) ×2 IMPLANT
GLOVE INDICATOR 7.5 STRL GRN (GLOVE) ×2 IMPLANT
GOWN STRL REUS W/ TWL LRG LVL3 (GOWN DISPOSABLE) ×3 IMPLANT
GOWN STRL REUS W/TWL LRG LVL3 (GOWN DISPOSABLE) ×3
NS IRRIG 1000ML POUR BTL (IV SOLUTION) ×2 IMPLANT
PACK C SECTION (MISCELLANEOUS) ×2 IMPLANT
PAD OB MATERNITY 4.3X12.25 (PERSONAL CARE ITEMS) ×4 IMPLANT
PAD PREP 24X41 OB/GYN DISP (PERSONAL CARE ITEMS) ×2 IMPLANT
PENCIL SMOKE ULTRAEVAC 22 CON (MISCELLANEOUS) ×2 IMPLANT
SPONGE LAP 18X18 RF (DISPOSABLE) ×2 IMPLANT
STRIP CLOSURE SKIN 1/2X4 (GAUZE/BANDAGES/DRESSINGS) ×2 IMPLANT
SUT MNCRL 4-0 (SUTURE) ×1
SUT MNCRL 4-0 27XMFL (SUTURE) ×1
SUT PDS AB 1 TP1 96 (SUTURE) ×2 IMPLANT
SUT PLAIN 2 0 XLH (SUTURE) ×2 IMPLANT
SUT PLAIN GUT 0 (SUTURE) IMPLANT
SUT VIC AB 0 CTX 36 (SUTURE) ×2
SUT VIC AB 0 CTX36XBRD ANBCTRL (SUTURE) ×2 IMPLANT
SUTURE MNCRL 4-0 27XMF (SUTURE) ×1 IMPLANT
SWABSTK COMLB BENZOIN TINCTURE (MISCELLANEOUS) ×2 IMPLANT

## 2019-12-30 NOTE — Anesthesia Preprocedure Evaluation (Signed)
Anesthesia Evaluation  Patient identified by MRN, date of birth, ID band Patient awake    Reviewed: Allergy & Precautions, H&P , NPO status , Patient's Chart, lab work & pertinent test results, reviewed documented beta blocker date and time   Airway Mallampati: III  TM Distance: >3 FB Neck ROM: full    Dental no notable dental hx. (+) Teeth Intact   Pulmonary neg pulmonary ROS, Current Smoker,    Pulmonary exam normal breath sounds clear to auscultation       Cardiovascular Exercise Tolerance: Good hypertension, On Medications and On Home Beta Blockers negative cardio ROS   Rhythm:regular Rate:Normal     Neuro/Psych PSYCHIATRIC DISORDERS Depression negative neurological ROS     GI/Hepatic negative GI ROS, Neg liver ROS,   Endo/Other  diabetes, GestationalMorbid obesity  Renal/GU      Musculoskeletal   Abdominal   Peds  Hematology  (+) Blood dyscrasia, anemia ,   Anesthesia Other Findings   Reproductive/Obstetrics (+) Pregnancy                             Anesthesia Physical Anesthesia Plan  ASA: III and emergent  Anesthesia Plan: Epidural   Post-op Pain Management:  Regional for Post-op pain   Induction:   PONV Risk Score and Plan: 3  Airway Management Planned:   Additional Equipment:   Intra-op Plan:   Post-operative Plan:   Informed Consent: I have reviewed the patients History and Physical, chart, labs and discussed the procedure including the risks, benefits and alternatives for the proposed anesthesia with the patient or authorized representative who has indicated his/her understanding and acceptance.       Plan Discussed with:   Anesthesia Plan Comments:         Anesthesia Quick Evaluation

## 2019-12-30 NOTE — Op Note (Signed)
Cesarean Section Operative Note    Patient Name: Jenna Sims  MRN: [id number]  Date of Surgery: 12/30/2019   Pre-operative Diagnosis:  1) Fetal intolerance of labor 2) intrauterine pregnancy at [redacted]w[redacted]d 3) Fetal growth restriction, third trimester  Post-operative Diagnosis:  1) Fetal intolerance of labor 2) intrauterine pregnancy at [redacted]w[redacted]d 3) Fetal growth restriction, third trimester   Procedure: Primary Low Transverse Cesarean Section via Pfannenstiel incision with double-layer uterine closure  Surgeon: Surgeon(s) and Role:    Conard Novak, MD - Primary   Assistants: Paula Compton, CNM; No other capable assistant available, in surgery requiring high level assistant.  Anesthesia: spinal   Findings:  1) normal appearing gravid uterus, fallopian tubes, and ovaries 2) viable female infant with weight 2,560 grams, APGARs 8, 8, and 9   Quantified Blood Loss: 978 mL  Total IV Fluids: 900 ml   Urine Output: 100 mL clear urine  Specimens: placenta  Complications: no complications  Disposition: PACU - hemodynamically stable.   Maternal Condition: stable   Baby condition / location:  Couplet care / Skin to Skin  Procedure Details:  The patient was seen in the Holding Room. The risks, benefits, complications, treatment options, and expected outcomes were discussed with the patient. The patient concurred with the proposed plan, giving informed consent. identified as Jenna Sims and the procedure verified as C-Section Delivery. A Time Out was held and the above information confirmed.   After induction of anesthesia, the patient was draped and prepped in the usual sterile manner. A Pfannenstiel incision was made and carried down through the subcutaneous tissue to the fascia. Fascial incision was made and extended transversely. The fascia was separated from the underlying rectus tissue superiorly and inferiorly. The peritoneum was identified and entered. Peritoneal  incision was extended longitudinally. The bladder flap was not bluntly or sharply freed from the lower uterine segment. A low transverse uterine incision was made and the hysterotomy was extended with cranial-caudal tension. Delivered from cephalic presentation was a 2,560 gram Living newborn infant(s) or Female with Apgar scores of 8 at one minute and 8 at five minutes. Cord ph was not sent the umbilical cord was clamped and cut cord blood was obtained for evaluation. The placenta was removed Intact and appeared normal. The uterus was unable to be exteriorized due to its bulk compared to the peritoneal incision. No apparent scar tissue.  The uterine incision was closed with running locked sutures of 0 Vicryl.  There was a small extension to the left cervix.  This was incorporated in both layers of the closure.  A second layer of the same suture was thrown in an imbricating fashion.  Hemostasis was assured.  The paracolic gutters were cleared of all clots and debris.  The rectus muscles were inspected and found to be hemostatic.  The On-Q catheter pumps were inserted in accordance with the manufacturer's recommendations.  The catheters were inserted approximately 4cm cephelad to the incision line, approximately 1cm apart, straddling the midline.  They were inserted to a depth of the 4th mark. They were positioned superficial to the rectus abdominus muscles and deep to the rectus fascia.    The fascia was then reapproximated with running sutures of 1-0 PDS, looped. The subcuticular closure was performed using 4-0 monocryl. The subcutaneous tissue was reapproximated using 2-0 plain gut such that no greater than 2cm of dead space remained. The skin closure was reinforced using surgical skin glue.  The On-Q catheters were bolused with 5  mL of 0.5% marcaine plain for a total of 10 mL.  The catheters were affixed to the skin with surgical skin glue, steri-strips, and tegaderm.    The surgical assistant performed  tissue retraction, assistance with suturing, and fundal pressure.    Instrument, sponge, and needle counts were correct prior the abdominal closure and were correct at the conclusion of the case.  The patient received Ancef 2 gram IV prior to skin incision (within 30 minutes). For VTE prophylaxis she was wearing SCDs throughout the case.  The assistant surgeon was a CNM due to lack of availability of another Sales promotion account executive.   Signed: Conard Novak, MD 12/30/2019 4:08 PM

## 2019-12-30 NOTE — Discharge Summary (Addendum)
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 yo [phone] [id number] admitted to the hospital 12/30/2019 for induction of labor. Patient had a labor course significant for induction with misoprostol and repetitive late decelerations that required terbutaline to help stop. The patient went for cesarean section due to fetal intolerance of labor. Delivery details are as follows: Membrane Rupture Time/Date: 1:44 PM ,12/30/2019   Delivery Method:C-Section, Low Transverse  Details of operation can be found in separate operative Note.  Patient had an uncomplicated postpartum course. She is ambulating, tolerating a regular diet, passing flatus, and urinating well.  Patient is discharged home in stable condition on 01/01/20.      Newborn Data: Birth date:12/30/2019  Birth time:1:45 PM  Gender:Female  Living status:Living  Apgars:8 ,8  Weight:2560 g                                 Magnesium  Sulfate received: No BMZ received: No Rhophylac:No MMR:No T-DaP:Given prenatally on 11/30/2019 Transfusion:No  Physical exam  Vitals:   12/31/19 1926 12/31/19 2313 01/01/20 0354 01/01/20 0818  BP: 138/65 125/67 126/72 131/90  Pulse: (!) 106 100 85 84  Resp: _0 Temp: 98.9 F (37.2 C) 98.5 F (36.9 C) 98.2 F (36.8 C) 98.4 F (36.9 C)  TempSrc: Oral Oral Oral Oral  SpO2: 98% 97% 99% 100%  Weight:      Height:       General: alert, cooperative and no distress Lochia: appropriate Uterine Fundus: firm Incision: Dressing c/d/i, On Q pump intact DVT Evaluation: No evidence of DVT seen on physical exam. Labs: Lab Results  Component Value Date   WBC 20.1 (H) 12/31/2019   HGB 8.5 (L) 12/31/2019   HCT 26.4 (L) 12/31/2019   MCV 73.3 (L) 12/31/2019   PLT 216 12/31/2019   CMP Latest Ref Rng & Units 12/31/2019  Glucose 70 - 99 mg/dL 120(H)  BUN 6 - 20 mg/dL 7  Creatinine 0.44 - 1.00 mg/dL 0.43(L)  Sodium 135 - 145 mmol/L 134(L)  Potassium 3.5 - 5.1 mmol/L 3.3(L)  Chloride 98 - 111 mmol/L 103  CO2 22 - 32 mmol/L 22  Calcium 8.9 - 10.3 mg/dL 7.9(L)  Total Protein 6.5 - 8.1 g/dL -  Total Bilirubin 0.3 - 1.2 mg/dL -  Alkaline Phos 38 - 126 U/L -  AST 15 - 41 U/L -  ALT 0 - 44 U/L -   Edinburgh Score: Edinburgh Postnatal Depression Scale Screening Tool 12/31/2019  I have been able to laugh and see the funny side of things. 0  I have looked forward with enjoyment to things. 0  I have blamed myself unnecessarily when things went wrong. 1  I have been anxious or worried for no good reason. 2  I have felt scared or panicky for no good reason. 0  Things have been getting on top of me. 0  I have been so unhappy that I have had difficulty sleeping. 0  I have felt sad or miserable. 0  I have been so unhappy that I have been crying. 0  The thought of harming myself has occurred to me. 0  Edinburgh Postnatal Depression Scale Total 3      After visit meds:  Allergies as  of 01/01/2020   No Known Allergies     Medication List    STOP taking these medications   aspirin 81 MG chewable tablet   labetalol 100 MG tablet Commonly known as: NORMODYNE   potassium chloride SA 20 MEQ tablet Commonly known as: KLOR-CON   promethazine 12.5 MG tablet Commonly known as: PHENERGAN   valACYclovir 500 MG tablet Commonly known as: Valtrex     TAKE these medications   enoxaparin 40 MG/0.4ML injection Commonly known as: LOVENOX Inject 0.4 mLs (40 mg total) into the skin daily for 21 days.   ibuprofen 600 MG tablet Commonly known as: ADVIL Take 1 tablet (600 mg total) by mouth every 6 (six) hours.   NIFEdipine 60 MG 24 hr tablet Commonly known as: Procardia XL Take 1 tablet (60 mg total) by mouth daily.   oxyCODONE 5 MG immediate release tablet Commonly known as: Oxy IR/ROXICODONE Take 1 tablet (5 mg total) by mouth every 6 (six) hours as needed for up to 3 days for moderate pain or severe pain.            Discharge Care Instructions  (From admission, onward)         Start     Ordered   01/01/20 0000  Discharge wound care:       Comments: Keep incision dry, clean.   01/01/20 0907           Discharge home in stable condition Infant Feeding: Breast Infant Disposition:home with mother Discharge instruction: per After Visit Summary and Postpartum booklet. Activity: Advance as tolerated. Pelvic rest for 6 weeks.  Diet: routine diet Anticipated Birth Control: Vasectomy Postpartum Appointment:1 week Additional Postpartum F/U: Incision check 1 week Future Appointments: Future Appointments  Date Time Provider [place]  01/04/2020  1:30 PM [facility] INJECTION [facility] None  01/07/2020 11:30 AM Will Bonnet, MD [facility] None  01/11/2020  1:30 PM [facility] INJECTION [facility] None  01/12/2020 12:45 PM [facility] LAB [facility] None  01/12/2020  1:00 PM Corcoran, Lenna Sciara C, MD [facility] None  01/12/2020  1:30 PM [facility] INFUSION CHAIR 2  [facility] None  02/03/2020  1:45 PM [facility] INJECTION [facility] None  03/02/2020  1:45 PM [facility] INJECTION [facility] None  03/30/2020  1:45 PM [facility] INJECTION [facility] None  04/27/2020  2:00 PM [facility] INJECTION [facility] None  05/25/2020  2:15 PM [facility] INJECTION [facility] None  06/22/2020  2:00 PM [facility] INJECTION [facility] None   Follow up Visit:    01/01/2020 Rod Can, CNM

## 2019-12-30 NOTE — Transfer of Care (Signed)
Immediate Anesthesia Transfer of Care Note  Patient: Jenna Sims  Procedure(s) Performed: CESAREAN SECTION  Patient Location: PACU and Mother/Baby  Anesthesia Type:Spinal  Level of Consciousness: awake, alert , oriented and patient cooperative  Airway & Oxygen Therapy: Patient Spontanous Breathing  Post-op Assessment: Report given to RN and Post -op Vital signs reviewed and stable  Post vital signs: Reviewed and stable  Last Vitals:  Vitals Value Taken Time  BP    Temp    Pulse    Resp    SpO2      Last Pain:  Vitals:   12/30/19 1236  TempSrc: Oral  PainSc:          Complications: No complications documented.

## 2019-12-30 NOTE — Progress Notes (Signed)
Called to see patient due to repetitive late decelerations. She has been given one dose of terbutaline. The fetus seems somewhat better now, but still is having intermittent late decelerations.  Last dose of cytotec about 4 hours ago. Discussed moving forward with IOL vs delivery. She is remote from delivery without really strong contractions so far.  Mutual agreement made to proceed with urgent cesarean section. I personally consented her for the surgery to which she readily agreed. Nursing and OR, anesthesia aware.  Thomasene Mohair, MD, [facility], [facility] 12/30/2019 1:06 PM

## 2019-12-30 NOTE — Progress Notes (Addendum)
Jenna Sims is a 34 y.o. [phone] at [redacted]w[redacted]d by ultrasound admitted for induction of labor due to Hypertension and Poor fetal growth. She is supported by her husband. Her baby's name is "Creed' and he was dx'ed with achondroplastic via ultrasound at his anatomy ultrasound. Jenna Sims received both doses of BMZ over the last few days. An admission H and P has been completed. No changes in her medical status/hx.  Subjective: She denies feeling any contractions. The baby has been moving well.   Objective: BP 136/76 (BP Location: Left Arm)   Pulse 96   Temp 98.6 F (37 C) (Oral)   Resp 18   Ht 5\' 1"  (1.549 m)   Wt 99.3 kg   LMP 04/20/2019   SpO2 99%   BMI 41.38 kg/m  No intake/output data recorded. No intake/output data recorded.  FHT:  FHR: 130 baseline bpm, variability: moderate,  accelerations:  Present,  decelerations:  Absent UC:   none SVE:     1cm/50%/-3. Cervix is posterior, softening. Labs: Lab Results  Component Value Date   WBC 12.3 (H) 12/17/2019   HGB 10.7 (L) 12/23/2019   HCT 35.2 (L) 12/23/2019   MCV 69.5 (L) 12/17/2019   PLT 293 12/17/2019    Assessment / Plan: Induction of labor due to IUGR and chronic HTN,  progressing well on pitocin  Labor: will commence with cervical ripening  Fetal Wellbeing:  Category I Pain Control:  plans on epidural if pitocin is used I/D:  GBS +- will treat once contracting Anticipated MOD:  NSVD  cytotec 25 mcg placed vaginally and 25 mcg buccal administered. Recheck in 4-6 hours.  02/16/2020 12/30/2019, 9:21 AM

## 2019-12-30 NOTE — Anesthesia Procedure Notes (Signed)
Spinal  Patient location during procedure: OR Staffing Performed: resident/CRNA  Anesthesiologist: Ramsdell, Kathryn F, MD Resident/CRNA: Starr, Deana, CRNA Preanesthetic Checklist Completed: patient identified, IV checked, site marked, risks and benefits discussed, surgical consent, monitors and equipment checked, pre-op evaluation and timeout performed Spinal Block Patient position: sitting Prep: DuraPrep Patient monitoring: heart rate, cardiac monitor, continuous pulse ox and blood pressure Approach: midline Location: L3-4 Injection technique: single-shot Needle Needle type: Sprotte  Needle gauge: 24 G Needle length: 9 cm Assessment Sensory level: T4 Additional Notes IV functioning, monitors applied to pt. Expiration date of kit checked and confirmed to be in date. Sterile prep and drape, hand hygiene and sterile gloved used. Pt was positioned and spine was prepped in sterile fashion. Skin was anesthetized with lidocaine. Free flow of clear CSF obtained prior to injecting local anesthetic into CSF x 1 attempt. Spinal needle aspirated freely following injection. Needle was carefully withdrawn, and pt tolerated procedure well. Loss of motor and sensory on exam post injection.      

## 2019-12-30 NOTE — H&P (Signed)
Jenna Sims is a 33 y.o. G4P2012 at [redacted]w[redacted]d by ultrasound admitted for induction of labor due to Hypertension and Poor fetal growth. She is supported by her husband. Her baby's name is "Creed' and he was dx'ed with achondroplastic via ultrasound at his anatomy ultrasound. Jenna Sims received both doses of BMZ over the last few days. An admission H and P has been completed. No changes in her medical status/hx.  Subjective: She denies feeling any contractions. The baby has been moving well.   Objective: BP 136/76 (BP Location: Left Arm)   Pulse 96   Temp 98.6 F (37 C) (Oral)   Resp 18   Ht 5' 1" (1.549 Jenna Sims)   Wt 99.3 kg   LMP 04/20/2019   SpO2 99%   BMI 41.38 kg/Jenna Sims  No intake/output data recorded. No intake/output data recorded.  FHT:  FHR: 130 baseline bpm, variability: moderate,  accelerations:  Present,  decelerations:  Absent UC:   none SVE:     1cm/50%/-3. Cervix is posterior, softening. Labs: Lab Results  Component Value Date   WBC 12.3 (H) 12/17/2019   HGB 10.7 (L) 12/23/2019   HCT 35.2 (L) 12/23/2019   MCV 69.5 (L) 12/17/2019   PLT 293 12/17/2019    Assessment / Plan: Induction of labor due to IUGR and chronic HTN,  progressing well on pitocin  Labor: will commence with cervical ripening  Fetal Wellbeing:  Category I Pain Control:  plans on epidural if pitocin is used I/D:  GBS +- will treat once contracting Anticipated MOD:  NSVD  cytotec 25 mcg placed vaginally and 25 mcg buccal administered. Recheck in 4-6 hours.  Jenna Sims Jenna Sims Jenna Sims 12/30/2019, 9:21 AM  

## 2019-12-31 ENCOUNTER — Encounter: Payer: Self-pay | Admitting: Obstetrics and Gynecology

## 2019-12-31 LAB — BASIC METABOLIC PANEL
Anion gap: 9 (ref 5–15)
BUN: 7 mg/dL (ref 6–20)
CO2: 22 mmol/L (ref 22–32)
Calcium: 7.9 mg/dL — ABNORMAL LOW (ref 8.9–10.3)
Chloride: 103 mmol/L (ref 98–111)
Creatinine, Ser: 0.43 mg/dL — ABNORMAL LOW (ref 0.44–1.00)
GFR calc Af Amer: >60 mL/min (ref >60)
GFR calc non Af Amer: >60 mL/min (ref >60)
Glucose, Bld: 120 mg/dL — ABNORMAL HIGH (ref 70–99)
Potassium: 3.3 mmol/L — ABNORMAL LOW (ref 3.5–5.1)
Sodium: 134 mmol/L — ABNORMAL LOW (ref 135–145)

## 2019-12-31 LAB — CBC
HCT: 26.4 % — ABNORMAL LOW (ref 36.0–46.0)
Hemoglobin: 8.5 g/dL — ABNORMAL LOW (ref 12.0–15.0)
MCH: 23.6 pg — ABNORMAL LOW (ref 26.0–34.0)
MCHC: 32.2 g/dL (ref 30.0–36.0)
MCV: 73.3 fL — ABNORMAL LOW (ref 80.0–100.0)
Platelets: 216 10*3/uL (ref 150–400)
RBC: 3.6 MIL/uL — ABNORMAL LOW (ref 3.87–5.11)
RDW: 21.9 % — ABNORMAL HIGH (ref 11.5–15.5)
WBC: 20.1 10*3/uL — ABNORMAL HIGH (ref 4.0–10.5)
nRBC: 0 % (ref 0.0–0.2)

## 2019-12-31 LAB — RPR: RPR Ser Ql: NONREACTIVE

## 2019-12-31 MED ORDER — IBUPROFEN 600 MG PO TABS
600.0000 mg | ORAL_TABLET | Freq: Four times a day (QID) | ORAL | Status: DC
  Administered 2020-01-01: 600 mg via ORAL
  Filled 2019-12-31: qty 1

## 2019-12-31 NOTE — Discharge Instructions (Signed)

## 2019-12-31 NOTE — Anesthesia Postprocedure Evaluation (Signed)
Anesthesia Post Note  Patient: Jenna Sims  Procedure(s) Performed: CESAREAN SECTION  Patient location during evaluation: Mother Baby Anesthesia Type: Spinal Level of consciousness: oriented and awake and alert Pain management: pain level controlled Vital Signs Assessment: post-procedure vital signs reviewed and stable Respiratory status: spontaneous breathing and respiratory function stable Cardiovascular status: blood pressure returned to baseline and stable Postop Assessment: no headache, no backache, no apparent nausea or vomiting and able to ambulate Anesthetic complications: no   No complications documented.   Last Vitals:  Vitals:   12/31/19 0337 12/31/19 0749  BP: 134/73 140/77  Pulse: 98 79  Resp: 20 20  Temp:  36.6 C  SpO2: 96% 99%    Last Pain:  Vitals:   12/31/19 0749  TempSrc: Oral  PainSc:                  Elmarie Mainland

## 2019-12-31 NOTE — Progress Notes (Signed)
Obstetric Postpartum/PostOperative Daily Progress Note Subjective:  34 y.o. [id number] post-operative day # 1 status post primary cesarean delivery.  She is ambulating, is tolerating po, is voiding spontaneously.  Her pain is well controlled on PO pain medications. Her lochia is less than menses. She reports breastfeeding is going well.   Medications SCHEDULED MEDICATIONS  . acetaminophen  1,000 mg Oral Q6H  . enoxaparin (LOVENOX) injection  40 mg Subcutaneous Q24H  . ferrous sulfate  325 mg Oral BID WC  . ibuprofen  600 mg Oral Q6H  . ketorolac  30 mg Intravenous Q6H  . NIFEdipine  60 mg Oral Daily  . prenatal multivitamin  1 tablet Oral Q1200  . scopolamine  1 patch Transdermal Once  . senna-docusate  2 tablet Oral Q24H  . simethicone  80 mg Oral TID PC    MEDICATION INFUSIONS  . lactated ringers 125 mL/hr at 12/31/19 0555  . naLOXone (NARCAN) adult infusion for PRURITIS      PRN MEDICATIONS  coconut oil, witch hazel-glycerin **AND** dibucaine, diphenhydrAMINE **OR** diphenhydrAMINE, diphenhydrAMINE, influenza vac split quadrivalent PF, menthol-cetylpyridinium, nalbuphine **OR** nalbuphine, naloxone **AND** sodium chloride flush, naLOXone (NARCAN) adult infusion for PRURITIS, ondansetron (ZOFRAN) IV, oxyCODONE, oxyCODONE-acetaminophen **OR** oxyCODONE-acetaminophen    Objective:   Vitals:   12/31/19 0700 12/31/19 0749 12/31/19 0800 12/31/19 0900  BP:  140/77    Pulse: 84 79 87 86  Resp:  20    Temp:  97.9 F (36.6 C)    TempSrc:  Oral    SpO2: 98% 99% 99% 100%  Weight:      Height:        Current Vital Signs 24h Vital Sign Ranges  T 97.9 F (36.6 C) Temp  Avg: 98.1 F (36.7 C)  Min: 97.4 F (36.3 C)  Max: 98.5 F (36.9 C)  BP 140/77 BP  Min: 102/72  Max: 158/79  HR 86 Pulse  Avg: 86  Min: 69  Max: 98  RR 20 Resp  Avg: 19.6  Min: 17  Max: 24  SaO2 100 % Room Air SpO2  Avg: 98 %  Min: 93 %  Max: 100 %       24 Hour I/O Current Shift I/O  Time Ins Outs 09/22 0701 -  09/23 0700 In: 1640.4 [I.V.:1540.4] Out: 1458 [Urine:375] 09/23 0701 - 09/23 1900 In: -  Out: 800 [Urine:800]  General: NAD Pulmonary: no increased work of breathing Abdomen: non-distended, non-tender, fundus firm at level of umbilicus Inc: Clean/dry/intact Extremities: no edema, no erythema, no tenderness  Labs:  Recent Labs  Lab 12/30/19 0813 12/31/19 0615  WBC 20.3* 20.1*  HGB 9.5* 8.5*  HCT 31.0* Jenna Sims Sims*  PLT 244 216     Assessment:   Jenna Sims y.o. [id number] postoperative day # 1 status post primary cesarean section, lactating  Plan:  1) Acute blood loss anemia - hemodynamically stable and asymptomatic - po ferrous sulfate  2) A NEG / Rubella 2.54 (03/10 1543)/ Varicella Immune  3) TDAP status up to date  4) breast /Contraception = vasectomy  5) Disposition: continue current care   [name], CNM 12/31/2019 11:30 AM

## 2019-12-31 NOTE — Anesthesia Post-op Follow-up Note (Signed)
  Anesthesia Pain Follow-up Note  Patient: Jenna Sims  Day #: 1  Date of Follow-up: 12/31/2019 Time: 8:13 AM  Last Vitals:  Vitals:   12/31/19 0337 12/31/19 0749  BP: 134/73 140/77  Pulse: 98 79  Resp: 20 20  Temp:  36.6 C  SpO2: 96% 99%    Level of Consciousness: alert  Pain: none   Side Effects:None  Catheter Site Exam:clean, dry, no drainage  Anti-Coag Meds (From admission, onward)   Start     Dose/Rate Route Frequency Ordered Stop   12/31/19 1500  enoxaparin (LOVENOX) injection 40 mg        40 mg Subcutaneous Every 24 hours 12/30/19 1717         Plan: D/C from anesthesia care at surgeon's request  Deivi Huckins B Alonza Smoker

## 2019-12-31 NOTE — Lactation Note (Signed)
This note was copied from a baby's chart. Lactation Consultation Note  Patient Name: Jenna Sims [id number] Date: 12/31/2019 Reason for consult: Initial assessment  Lactation Rounds: LC to the room for initial visit. Baby was skin to skin after the bath. Mother stated baby had been feeding well at breast but has since become more sleepy today and not seeming as eager today. LC reviewed baby's gestational age and size can affect how well baby breast feeds in the first couple of weeks. LC encouraged skin to skin and hand expression to assist baby with feeds. Taught hand expression and spoon feeding. Baby was then placed back sin to skin. Talked with Mother if baby is not latching and feeding by 24 HOL, then we can start her pumping. Mother stated that is her plan when she discharges anyway. This is how she fed her 2nd child as well. BF in the hospital and then pumped at home. LC encouraged feeding on demand and with cues, if baby is not cueing by 3 hours, encouraged hand expression and attempting feed. Reviewed diapers for days of life. [facility] # written on board, encouraged to call with future needs. Stated understanding with all teaching.   Maternal Data Formula Feeding for Exclusion: No Has patient been taught Hand Expression?: Yes Does the patient have breastfeeding experience prior to this delivery?: Yes  Feeding Feeding Type: Breast Fed  LATCH Score                   Interventions Interventions: Hand express  Lactation Tools Discussed/Used Tools: Other (comment)   Consult Status Consult Status: Follow-up Date: 01/01/20 Follow-up type: Call as needed    Doloris Servantes D Akia Montalban 12/31/2019, 12:05 PM

## 2020-01-01 LAB — SURGICAL PATHOLOGY

## 2020-01-01 MED ORDER — ENOXAPARIN SODIUM 40 MG/0.4ML ~~LOC~~ SOLN: 40.0000 mg | SUBCUTANEOUS | 0 refills | Status: DC

## 2020-01-01 MED ORDER — NIFEDIPINE ER OSMOTIC RELEASE 60 MG PO TB24: 60.0000 mg | ORAL_TABLET | Freq: Every day | ORAL | 11 refills | Status: DC

## 2020-01-01 MED ORDER — OXYCODONE HCL 5 MG PO TABS
5.0000 mg | ORAL_TABLET | Freq: Four times a day (QID) | ORAL | 0 refills | Status: AC | PRN
Start: 2020-01-01 — End: 2020-01-04

## 2020-01-01 MED ORDER — IBUPROFEN 600 MG PO TABS: 600.0000 mg | ORAL_TABLET | Freq: Four times a day (QID) | ORAL | 0 refills | Status: DC

## 2020-01-01 NOTE — Progress Notes (Signed)
Patient discharged home with infant. Discharge instructions and prescriptions given and reviewed with patient. Patient verbalized understanding. Escorted out by auxillary.  

## 2020-01-04 ENCOUNTER — Inpatient Hospital Stay: Payer: Federal, State, Local not specified - PPO

## 2020-01-04 ENCOUNTER — Other Ambulatory Visit: Payer: Self-pay

## 2020-01-04 DIAGNOSIS — D509 Iron deficiency anemia, unspecified: Secondary | ICD-10-CM | POA: Diagnosis not present

## 2020-01-04 DIAGNOSIS — E538 Deficiency of other specified B group vitamins: Secondary | ICD-10-CM | POA: Diagnosis not present

## 2020-01-04 MED ORDER — CYANOCOBALAMIN 1000 MCG/ML IJ SOLN
1000.0000 ug | Freq: Once | INTRAMUSCULAR | Status: AC
  Administered 2020-01-04: 1000 ug via INTRAMUSCULAR

## 2020-01-04 MED ORDER — CYANOCOBALAMIN 1000 MCG/ML IJ SOLN
INTRAMUSCULAR | Status: AC
  Filled 2020-01-04: qty 1

## 2020-01-06 ENCOUNTER — Ambulatory Visit: Payer: Federal, State, Local not specified - PPO

## 2020-01-07 ENCOUNTER — Encounter: Payer: Self-pay | Admitting: Obstetrics and Gynecology

## 2020-01-07 ENCOUNTER — Ambulatory Visit (INDEPENDENT_AMBULATORY_CARE_PROVIDER_SITE_OTHER): Payer: Federal, State, Local not specified - PPO | Admitting: Obstetrics and Gynecology

## 2020-01-07 ENCOUNTER — Other Ambulatory Visit: Payer: Self-pay

## 2020-01-07 VITALS — BP 144/87 | HR 86 | Ht 61.0 in | Wt 208.0 lb

## 2020-01-07 DIAGNOSIS — Z09 Encounter for follow-up examination after completed treatment for conditions other than malignant neoplasm: Secondary | ICD-10-CM

## 2020-01-07 NOTE — Progress Notes (Signed)
   Postoperative Follow-up Patient presents post op from cesarean section  8 days ago.  Subjective: She denies fever, chills, nausea and vomiting. Eating a regular diet without difficulty. Pain is well controlled with medication.  Activity: increasing slowly. She denies issues with her incision.   She is taking her BP medication. She states that she is getting 110s/70s at home. Taking nifedipine xl 60 mg and labetalol 200 mg bid.  No issues.  Also, she is taking lovenox daily.   Objective: BP (!) 144/87   Pulse 86   Ht 5\' 1"  (1.549 m)   Wt 208 lb (94.3 kg)   Breastfeeding Yes   BMI 39.30 kg/m   Constitutional: Well nourished, well developed female in no acute distress.  HEENT: normal.Skin: Warm and dry.  Abdomen: s/nt/nd/fundus at U-2 clean, dry, intact and without erythema, induration, warmth, and tenderness Extremity: no edema   Assessment: 34 y.o. s/p cesarean section progressing well  Plan: Patient has done well after surgery with no apparent complications.  I have discussed the post-operative course to date, and the expected progress moving forward.  The patient understands what complications to be concerned about.    Activity plan: increase slowly. No lifting > 25 pound for the first six weeks. Plans vasectomy for contraception. Breastfeeding currently and no breast concerns.   Return in about 5 weeks (around 02/11/2020).  13/07/2019, MD 01/07/2020 11:47 AM

## 2020-01-11 ENCOUNTER — Encounter: Payer: Self-pay | Admitting: Hematology and Oncology

## 2020-01-11 ENCOUNTER — Ambulatory Visit: Payer: Federal, State, Local not specified - PPO

## 2020-01-11 NOTE — Progress Notes (Signed)
[facility]  [address] [place], [place] [address] Phone: [phone]  Fax: [phone]   Clinic Day:  01/12/2020  Referring physician: Clent Jacks, PA-C  Chief Complaint: Jenna Sims is a 34 y.o. female with anemia during pregnancy who is seen for assessment after Cesarean section.  HPI: The patient was last seen in the hematology clinic on 12/16/2019.  At that time, she was doing well.  She was [redacted] weeks pregnant.  She was tolerating IV iron well.  Exam was stable. She received Venofer on 12/16/2019 and 12/23/2019.  She received weekly B12 (12/16/2019 - 12/23/2019).  She underwent Cesarean section on 12/30/2019 at 37 weeks by Dr. Jean Rosenthal. She delivered a female infant (2560 gm; Apgars 8 and 8).  Blood loss was 978 mL.  Labs followed: 12/23/2019: Hematocrit 35.2, hemoglobin 10.7. 12/31/2019: Hematocrit 26.4, hemoglobin   8.5, MCV 73.3, platelets 216,000, WBC 20,100.  She received Venofer on 01/04/2020.  During the interim, she has been "busy." She had a little bit of dizziness after her surgery but it went away after a few days. She has been drinking a lot of water because breast feeding and pumping makes her thirsty. Her appetite has improved and she has been eating better. She is being conscious of what she is eating because she does not want to gain weight. She has been eating a lot of salads, chicken, and [place]. She denies ice pica and restless legs. She still has some spotting. She would like to try oral iron.   Past Medical History:  Diagnosis Date  . Abdominal pain   . Depression   . Heavy menstrual bleeding   . HSV-1 infection   . Hypertension   . Lower back pain     Past Surgical History:  Procedure Laterality Date  . CESAREAN SECTION  12/30/2019   Procedure: CESAREAN SECTION;  Surgeon: Conard Novak, MD;  Location: [facility] ORS;  Service: Obstetrics;;  . DILATION AND CURETTAGE OF UTERUS    . LAPAROSCOPY N/A 11/18/2018    Procedure: LAPAROSCOPY DIAGNOSTIC;  Surgeon: Conard Novak, MD;  Location: [facility] ORS;  Service: Gynecology;  Laterality: N/A;  . WISDOM TOOTH EXTRACTION      Family History  Problem Relation Age of Onset  . Hypertension Mother   . Hypertension Father   . Diabetes Maternal Grandfather   . Diabetes Paternal Grandmother     Social History:  reports that she has never smoked. She has never used smokeless tobacco. She reports previous alcohol use of about 1.0 standard drink of alcohol per week. She reports that she does not use drugs.Denies alcohol or tobacco. She denies exposure to radiation or toxins. She lives in [place]. 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 She is married and has two other sons named Jenna Sims (13) and Jenna Sims (7). She recently gave birth to Jenna Sims.  The patient is alone today.  Allergies: No Known Allergies  Current Medications: Current Outpatient Medications  Medication Sig Dispense Refill  . enoxaparin (LOVENOX) 40 MG/0.4ML injection Inject 0.4 mLs (40 mg total) into the skin daily for 21 days. 8.4 mL 0  . ibuprofen (ADVIL) 600 MG tablet Take 1 tablet (600 mg total) by mouth every 6 (six) hours. 30 tablet 0  . labetalol (NORMODYNE) 100 MG tablet Take 100 mg by mouth once. 2 tabs QD    . NIFEdipine (PROCARDIA XL) 60 MG 24  hr tablet Take 1 tablet (60 mg total) by mouth daily. 30 tablet 11   No current facility-administered medications for this visit.    Review of Systems  Constitutional: Positive for weight loss (15 lbs, delivered baby). Negative for chills, diaphoresis, fever and malaise/fatigue.  HENT: Negative for congestion, ear discharge, ear pain, hearing loss, nosebleeds, sinus pain, sore throat and tinnitus.   Eyes: Negative for blurred vision.  Respiratory: Negative for cough, hemoptysis, sputum production and shortness of breath.   Cardiovascular: Negative for chest  pain, palpitations and leg swelling.  Gastrointestinal: Negative for abdominal pain, blood in stool, constipation, diarrhea, heartburn, melena, nausea and vomiting.       Denies ice pica. Eating well. Appetite improved. Staying hydrated.  Genitourinary: Negative for dysuria, frequency, hematuria and urgency.       Spotting.  Musculoskeletal: Negative for back pain, joint pain, myalgias and neck pain.  Skin: Negative for itching and rash.  Neurological: Positive for dizziness (after surgery, resolved). Negative for tingling, sensory change, weakness and headaches.       Denies restless legs.  Endo/Heme/Allergies: Does not bruise/bleed easily.  Psychiatric/Behavioral: Negative for depression and memory loss. The patient is not nervous/anxious and does not have insomnia.   All other systems reviewed and are negative.  Performance status (ECOG): 1  Vitals Blood pressure (!) 143/65, pulse 83, temperature 98 F (36.7 C), resp. rate 20, weight 203 lb 2.5 oz (92.1 kg), SpO2 100 %, currently breastfeeding.   Physical Exam Vitals and nursing note reviewed.  Constitutional:      General: She is not in acute distress.    Appearance: She is not diaphoretic.  HENT:     Head: Normocephalic and atraumatic.     Comments: Dark hair.    Mouth/Throat:     Mouth: Mucous membranes are moist.     Pharynx: Oropharynx is clear.  Eyes:     General: No scleral icterus.    Extraocular Movements: Extraocular movements intact.     Conjunctiva/sclera: Conjunctivae normal.     Pupils: Pupils are equal, round, and reactive to light.     Comments: Glasses.  Brown eyes.  Cardiovascular:     Rate and Rhythm: Normal rate and regular rhythm.     Heart sounds: Normal heart sounds. No murmur heard.   Pulmonary:     Effort: Pulmonary effort is normal. No respiratory distress.     Breath sounds: Normal breath sounds. No wheezing or rales.  Chest:     Chest wall: No tenderness.  Abdominal:     General: Bowel  sounds are normal. There is no distension.     Palpations: Abdomen is soft. There is no hepatomegaly, splenomegaly or mass.     Tenderness: There is no abdominal tenderness. There is no guarding or rebound.  Musculoskeletal:        General: No swelling or tenderness. Normal range of motion.     Cervical back: Normal range of motion and neck supple.     Right lower leg: No edema.     Left lower leg: No edema.  Lymphadenopathy:     Head:     Right side of head: No preauricular, posterior auricular or occipital adenopathy.     Left side of head: No preauricular, posterior auricular or occipital adenopathy.     Cervical: No cervical adenopathy.     Upper Body:     Right upper body: No supraclavicular or axillary adenopathy.     Left upper body: No supraclavicular  or axillary adenopathy.     Lower Body: No right inguinal adenopathy. No left inguinal adenopathy.  Skin:    General: Skin is warm and dry.  Neurological:     Mental Status: She is alert and oriented to person, place, and time.  Psychiatric:        Behavior: Behavior normal.        Thought Content: Thought content normal.        Judgment: Judgment normal.    No visits with results within 3 Day(s) from this visit.  Latest known visit with results is:  Admission on 12/30/2019, Discharged on 01/01/2020  Component Date Value Ref Range Status  . WBC 12/30/2019 20.3* 4.0 - 10.5 K/uL Final  . RBC 12/30/2019 4.17  3.87 - 5.11 MIL/uL Final  . Hemoglobin 12/30/2019 9.5* 12.0 - 15.0 g/dL Final  . HCT 91/47/829509/22/2021 31.0* 36 - 46 % Final  . MCV 12/30/2019 74.3* 80.0 - 100.0 fL Final  . MCH 12/30/2019 22.8* 26.0 - 34.0 pg Final  . MCHC 12/30/2019 30.6  30.0 - 36.0 g/dL Final  . RDW 62/13/086509/22/2021 21.9* 11.5 - 15.5 % Final  . Platelets 12/30/2019 244  150 - 400 K/uL Final  . nRBC 12/30/2019 0.0  0.0 - 0.2 % Final   Performed at [facility], [address] [place], [place] [address]  . Sodium 12/30/2019 136  135 - 145 mmol/L Final   . Potassium 12/30/2019 3.3* 3.5 - 5.1 mmol/L Final  . Chloride 12/30/2019 104  98 - 111 mmol/L Final  . CO2 12/30/2019 20* 22 - 32 mmol/L Final  . Glucose, Bld 12/30/2019 164* 70 - 99 mg/dL Final   Glucose reference range applies only to samples taken after fasting for at least 8 hours.  . BUN 12/30/2019 7  6 - 20 mg/dL Final  . Creatinine, Ser 12/30/2019 0.66  0.44 - 1.00 mg/dL Final  . Calcium 62/95/284109/22/2021 8.8* 8.9 - 10.3 mg/dL Final  . Total Protein 12/30/2019 5.9* 6.5 - 8.1 g/dL Final  . Albumin 32/44/010209/22/2021 2.7* 3.5 - 5.0 g/dL Final  . AST 72/53/664409/22/2021 19  15 - 41 U/L Final  . ALT 12/30/2019 9  0 - 44 U/L Final  . Alkaline Phosphatase 12/30/2019 97  38 - 126 U/L Final  . Total Bilirubin 12/30/2019 0.6  0.3 - 1.2 mg/dL Final  . GFR calc non Af Amer 12/30/2019 >60  >60 mL/min Final  . GFR calc Af Amer 12/30/2019 >60  >60 mL/min Final  . Anion gap 12/30/2019 12  5 - 15 Final   Performed at [facility], [address] [place], [place] [address]  . Creatinine, Urine 12/30/2019 156  mg/dL Final  . Total Protein, Urine 12/30/2019 25  mg/dL Final   NO NORMAL RANGE ESTABLISHED FOR THIS TEST  . Protein Creatinine Ratio 12/30/2019 0.16* 0.00 - 0.15 mg/mg[Cre] Final   Performed at [facility], [address] [place], [place] [address]  . RPR Ser Ql 12/30/2019 NON REACTIVE  NON REACTIVE Final   Performed at [facility], [address] [place], [place] [address]  . ABO/RH(D) 12/30/2019 A NEG   Final  . Antibody Screen 12/30/2019 POS   Final  . Sample Expiration 12/30/2019 01/02/2020,2359   Final  . Antibody Identification 12/30/2019    Final                   Value:PASSIVELY ACQUIRED ANTI-D Performed at [facility], [address]  [place], [place] [address]   . SURGICAL PATHOLOGY 12/30/2019    Final-Edited                   Value:SURGICAL PATHOLOGY CASE: [id number] PATIENT: [facility] Surgical Pathology Report  Specimen Submitted: A.  Placenta  Clinical History: GA = 104w0d.  Unscheduled Cesarean section, see delivery summary.  DIAGNOSIS: A. PLACENTA; CESAREAN SECTION: - SINGLETON PLACENTA, 381 G (APPROXIMATELY 10TH PERCENTILE FOR ESTIMATED GESTATIONAL AGE). - THREE-VESSEL UMBILICAL CORD WITH NO SIGNIFICANT HISTOPATHOLOGIC CHANGE; NEGATIVE FOR FUNISITIS AND THROMBUS. - PLACENTAL MEMBRANES WITH FOCAL MILD SUBCHORIONITIS (STAGE I MATERNAL INFLAMMATORY RESPONSE); NEGATIVE FOR SIGNIFICANT MECONIUM DEPOSITION. - PLACENTAL DISC WITH MILDLY ACCELERATED VILLOUS MATURATION AND VILLOUS HYPOVASCULARITY; NEGATIVE FOR VILLITIS, HEMORRHAGE, AND SIGNIFICANT INFARCT (LESS THAN 5% OF TOTAL DISC SURFACE AREA).  GROSS DESCRIPTION: A. Labeled: Placenta Received: Fresh Weight: 381 grams Size: 18.5 x 17.5 x 2 cm Shape: Roughly circular Accessory lobes: None Membranes: Pink-red to tan, s                         lightly thickened and cloudy Umbilical cord:      Length -37.5 cm in length x 1-1.3 cm in diameter      Number of vessels -3      Insertion -Eccentrically      Distance of insertion from margin -3.2 cm      Other findings -the cord is hypocoiled but otherwise unremarkable. Fetal surface: Gray blue and glistening Maternal surface: Intact with a minimal amount of adherent blood clot. Comments: Multiple sections through the placental parenchyma reveals a single pale-yellow discolored area measuring 1 x 0.8 x 0.5 cm, which is located at the outer third of the disc and comprises less than 1% of the total placental parenchymal volume. The rest of the placental parenchyma is dark red and spongy Block summary: 1 - umbilical cord 2 - membrane rolls 3-4-placental parenchyma  Final Diagnosis performed by Katherine Mantle, MD.   Electronically signed 01/01/2020 9:00:28AM The electronic signature indicates that the named Attending Pathologist has evaluated the specimen Technica                         l component performed at  [place], [address], [place], [place] [address] Lab: [phone] Dir: Jolene Schimke, MD, MMM  Professional component performed at [facility], [address] [place], [place], [place] [address] Lab: [phone] Dir: Georgiann Cocker. Rubinas, MD  . WBC 12/31/2019 20.1* 4.0 - 10.5 K/uL Final  . RBC 12/31/2019 3.60* 3.87 - 5.11 MIL/uL Final  . Hemoglobin 12/31/2019 8.5* 12.0 - 15.0 g/dL Final   Comment: Reticulocyte Hemoglobin testing may be clinically indicated, consider ordering this additional test [id number]   . HCT 12/31/2019 26.4* 36 - 46 % Final  . MCV 12/31/2019 73.3* 80.0 - 100.0 fL Final  . MCH 12/31/2019 23.6* 26.0 - 34.0 pg Final  . MCHC 12/31/2019 32.2  30.0 - 36.0 g/dL Final  . RDW 63/87/5643 21.9* 11.5 - 15.5 % Final  . Platelets 12/31/2019 216  150 - 400 K/uL Final  . nRBC 12/31/2019 0.0  0.0 - 0.2 % Final   Performed at [facility], [address] [place], [place] [address]  . Sodium 12/31/2019 134* 135 - 145 mmol/L Final  . Potassium 12/31/2019 3.3* 3.5 - 5.1 mmol/L Final  . Chloride 12/31/2019 103  98 - 111 mmol/L Final  . CO2 12/31/2019 22  22 -  32 mmol/L Final  . Glucose, Bld 12/31/2019 120* 70 - 99 mg/dL Final   Glucose reference range applies only to samples taken after fasting for at least 8 hours.  . BUN 12/31/2019 7  6 - 20 mg/dL Final  . Creatinine, Ser 12/31/2019 0.43* 0.44 - 1.00 mg/dL Final  . Calcium 36/64/4034 7.9* 8.9 - 10.3 mg/dL Final  . GFR calc non Af Amer 12/31/2019 >60  >60 mL/min Final  . GFR calc Af Amer 12/31/2019 >60  >60 mL/min Final  . Anion gap 12/31/2019 9  5 - 15 Final   Performed at [facility], [address] [place], [place] [address]    Assessment:  Jenna Sims is a 34 y.o. female with iron deficiency anemia during pregnancy. She delivered on 12/30/2019.  She has a history of heavy menses.  Diet is modest.  She has ice pica and restless legs.  CBC on 11/30/2019 revealed a hematocrit  31.8, hemoglobin 9.4, MCV 72.0, platelets 339,000, WBC 11,400.   Work-up on 12/09/2019 revealed a hematocrit 29.6, hemoglobin 9.2, MCV 69.6, platelets 314,000, WBC 11,400 (ANC 7,600). Ferritin was 4 with an iron saturation of 2% and a TIBC of 477. Folate was 7.6 and Vitamin B12 was 119 (low).   She has received Venofer weekly x 3 (12/09/2019 - 12/23/2019) and 01/04/2020.    She has a B12 deficiency.  B12 was 257 on 01/30/2018 and 119 on 12/09/2019.  She has received B12 injections weekly x 4 (12/16/2019- 12/23/2019; 01/04/2020).  Symptomatically, she is doing well.  She has some vaginal spotting after delivery.  She is eating well.  Exam is unremarkable.  Plan: 1.   Labs today:  CBC with diff, ferritin. 2.   Iron deficiency anemia  Hematocrit 29.6.  Hemoglobin 9.2.  MCV 69.6 on 12/09/2019.   Ferritin was 4 with an iron saturation of 2%.    Hematocrit 34.3.  Hemoglobin 10.3.  MCV 77.8 on 01/12/2020.   Ferritin 74.  She has some vaginal spotting.  Diet is good.  Ferritin goal 100.  She would like to try oral iron. 3.   B12 deficiency  B12 was 119 on 12/09/2019.  She began B12 injections on 12/16/2019.  Continue B12 monthly. 4.   No Venofer today. 5.   B12 today and monthly x 6. 6.   RTC in 1 month for labs (CBC, ferritin) and B12. 7.   RTC in 3 months for MD assessment, labs (CBC with diff, ferritin, iron studies) and B12.  I discussed the assessment and treatment plan with the patient.  The patient was provided an opportunity to ask questions and all were answered.  The patient agreed with the plan and demonstrated an understanding of the instructions.  The patient was advised to call back if the symptoms worsen or if the condition fails to improve as anticipated.   Melissa C. Merlene Pulling, MD, PhD    01/12/2020, 1:15 PM  I, Danella Penton Tufford, am acting as Neurosurgeon for [employer]. Merlene Pulling, MD, PhD.  I, Melissa C. Merlene Pulling, MD, have reviewed the above documentation for accuracy and  completeness, and I agree with the above.

## 2020-01-12 ENCOUNTER — Other Ambulatory Visit: Payer: Self-pay

## 2020-01-12 ENCOUNTER — Inpatient Hospital Stay: Payer: Federal, State, Local not specified - PPO

## 2020-01-12 ENCOUNTER — Encounter: Payer: Self-pay | Admitting: Hematology and Oncology

## 2020-01-12 ENCOUNTER — Inpatient Hospital Stay
Payer: Federal, State, Local not specified - PPO | Attending: Hematology and Oncology | Admitting: Hematology and Oncology

## 2020-01-12 VITALS — BP 143/65 | HR 83 | Temp 98.0°F | Resp 20 | Wt 203.2 lb

## 2020-01-12 VITALS — BP 144/90 | HR 88 | Temp 97.8°F | Resp 18

## 2020-01-12 DIAGNOSIS — D509 Iron deficiency anemia, unspecified: Secondary | ICD-10-CM | POA: Insufficient documentation

## 2020-01-12 DIAGNOSIS — E538 Deficiency of other specified B group vitamins: Secondary | ICD-10-CM | POA: Insufficient documentation

## 2020-01-12 LAB — CBC WITH DIFFERENTIAL/PLATELET
Abs Immature Granulocytes: 0.04 10*3/uL (ref 0.00–0.07)
Basophils Absolute: 0.1 10*3/uL (ref 0.0–0.1)
Basophils Relative: 1 %
Eosinophils Absolute: 0.2 10*3/uL (ref 0.0–0.5)
Eosinophils Relative: 2 %
HCT: 34.3 % — ABNORMAL LOW (ref 36.0–46.0)
Hemoglobin: 10.3 g/dL — ABNORMAL LOW (ref 12.0–15.0)
Immature Granulocytes: 0 %
Lymphocytes Relative: 31 %
Lymphs Abs: 3.1 10*3/uL (ref 0.7–4.0)
MCH: 23.4 pg — ABNORMAL LOW (ref 26.0–34.0)
MCHC: 30 g/dL (ref 30.0–36.0)
MCV: 77.8 fL — ABNORMAL LOW (ref 80.0–100.0)
Monocytes Absolute: 0.8 10*3/uL (ref 0.1–1.0)
Monocytes Relative: 8 %
Neutro Abs: 5.7 10*3/uL (ref 1.7–7.7)
Neutrophils Relative %: 58 %
Platelets: 536 10*3/uL — ABNORMAL HIGH (ref 150–400)
RBC: 4.41 MIL/uL (ref 3.87–5.11)
RDW: 22 % — ABNORMAL HIGH (ref 11.5–15.5)
WBC: 10 10*3/uL (ref 4.0–10.5)
nRBC: 0 % (ref 0.0–0.2)

## 2020-01-12 LAB — FERRITIN: Ferritin: 74 ng/mL (ref 11–307)

## 2020-01-12 MED ORDER — SODIUM CHLORIDE 0.9 % IV SOLN: 200.0000 mg | Freq: Once | INTRAVENOUS | Status: DC

## 2020-01-12 MED ORDER — CYANOCOBALAMIN 1000 MCG/ML IJ SOLN
1000.0000 ug | Freq: Once | INTRAMUSCULAR | Status: AC
  Administered 2020-01-12: 1000 ug via INTRAMUSCULAR
  Filled 2020-01-12: qty 1

## 2020-01-12 MED ORDER — SODIUM CHLORIDE 0.9 % IV SOLN
Freq: Once | INTRAVENOUS | Status: AC
  Filled 2020-01-12: qty 250

## 2020-01-12 MED ORDER — IRON SUCROSE 20 MG/ML IV SOLN
200.0000 mg | Freq: Once | INTRAVENOUS | Status: AC
  Administered 2020-01-12: 200 mg via INTRAVENOUS
  Filled 2020-01-12: qty 10

## 2020-01-13 ENCOUNTER — Telehealth: Payer: Self-pay

## 2020-01-13 NOTE — Telephone Encounter (Signed)
Left message letting patient know. advised her to call us if she needed anything

## 2020-01-13 NOTE — Telephone Encounter (Signed)
-----   Message from Rosey Bath, MD sent at 01/13/2020  6:37 AM EDT ----- Regarding: Please call patient with ferritin level  Ferritin level is 74.  ----- Message ----- From: Interface, Lab In [facility] Sent: 01/12/2020  12:55 PM EDT To: Rosey Bath, MD

## 2020-01-27 DIAGNOSIS — Z630 Problems in relationship with spouse or partner: Secondary | ICD-10-CM | POA: Diagnosis not present

## 2020-02-03 ENCOUNTER — Inpatient Hospital Stay: Payer: Federal, State, Local not specified - PPO

## 2020-02-03 ENCOUNTER — Other Ambulatory Visit: Payer: Self-pay

## 2020-02-03 DIAGNOSIS — D509 Iron deficiency anemia, unspecified: Secondary | ICD-10-CM | POA: Diagnosis not present

## 2020-02-03 DIAGNOSIS — E538 Deficiency of other specified B group vitamins: Secondary | ICD-10-CM | POA: Diagnosis not present

## 2020-02-03 MED ORDER — CYANOCOBALAMIN 1000 MCG/ML IJ SOLN
1000.0000 ug | Freq: Once | INTRAMUSCULAR | Status: AC
  Administered 2020-02-03: 1000 ug via INTRAMUSCULAR
  Filled 2020-02-03: qty 1

## 2020-02-10 ENCOUNTER — Other Ambulatory Visit: Payer: Self-pay

## 2020-02-10 ENCOUNTER — Ambulatory Visit (INDEPENDENT_AMBULATORY_CARE_PROVIDER_SITE_OTHER): Payer: Federal, State, Local not specified - PPO | Admitting: Obstetrics and Gynecology

## 2020-02-10 ENCOUNTER — Encounter: Payer: Self-pay | Admitting: Obstetrics and Gynecology

## 2020-02-10 NOTE — Progress Notes (Signed)
Postpartum Visit   Chief Complaint  Patient presents with  . Postpartum Care   History of Present Illness: Patient is a 34 y.o. [id number] presents for postpartum visit.  Date of delivery: 12/30/2019 Type of delivery: C-section Episiotomy No.  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 Baby's name: Creed. Birth weight: 2,560 grams (5.64 lbs) Maternal Details:  Breast Feeding:  yes Post partum depression/anxiety noted:  no Edinburgh Post-Partum Depression Score:  9  Date of last PAP: 01/2018  normal   Past Medical History:  Diagnosis Date  . Abdominal pain   . Depression   . Heavy menstrual bleeding   . HSV-1 infection   . Hypertension   . Lower back pain     Past Surgical History:  Procedure Laterality Date  . CESAREAN SECTION  12/30/2019   Procedure: CESAREAN SECTION;  Surgeon: Conard Novak, MD;  Location: [facility] ORS;  Service: Obstetrics;;  . DILATION AND CURETTAGE OF UTERUS    . LAPAROSCOPY N/A 11/18/2018   Procedure: LAPAROSCOPY DIAGNOSTIC;  Surgeon: Conard Novak, MD;  Location: [facility] ORS;  Service: Gynecology;  Laterality: N/A;  . WISDOM TOOTH EXTRACTION      Prior to Admission medications   Medication Sig Start Date End Date Taking? Authorizing Provider  ibuprofen (ADVIL) 600 MG tablet Take 1 tablet (600 mg total) by mouth every 6 (six) hours. 01/01/20  Yes [name], CNM  labetalol (NORMODYNE) 100 MG tablet Take 100 mg by mouth once. 2 tabs QD   Yes [provider]  NIFEdipine (PROCARDIA XL) 60 MG 24 hr tablet Take 1 tablet (60 mg total) by mouth daily. 01/01/20 12/31/20 Yes [name], CNM  enoxaparin (LOVENOX) 40 MG/0.4ML injection Inject 0.4 mLs (40 mg total) into the skin daily for 21 days. 01/01/20 01/22/20  [name], CNM    No Known  Allergies   Social History   Socioeconomic History  . Marital status: Married    Spouse name: Not on file  . Number of children: Not on file  . Years of education: Not on file  . Highest education level: Not on file  Occupational History  . Occupation: Special educational needs teacher  Tobacco Use  . Smoking status: Never Smoker  . Smokeless tobacco: Never Used  Vaping Use  . Vaping Use: Never used  Substance and Sexual Activity  . Alcohol use: Not Currently    Alcohol/week: 1.0 standard drink    Types: 1 Glasses of wine per week  . Drug use: Never  . Sexual activity: Not Currently    Birth control/protection: Surgical    Comment: vasectomy  Other Topics Concern  . Not on file  Social History Narrative  . Not on file   Social Determinants of Health   Financial Resource Strain:   . Difficulty of Paying Living Expenses: Not on file  Food Insecurity:   . Worried About Programme researcher, broadcasting/film/video in the Last Year: Not on file  . Ran Out of Food in the Last Year: Not on file  Transportation Needs:   . Lack of Transportation (Medical): Not on file  . Lack of Transportation (Non-Medical): Not on file  Physical Activity:   . Days of Exercise per Week: Not on file  . Minutes of Exercise per Session: Not on file  Stress:   . Feeling  of Stress : Not on file  Social Connections:   . Frequency of Communication with Friends and Family: Not on file  . Frequency of Social Gatherings with Friends and Family: Not on file  . Attends Religious Services: Not on file  . Active Member of Clubs or Organizations: Not on file  . Attends Banker Meetings: Not on file  . Marital Status: Not on file  Intimate Partner Violence:   . Fear of Current or Ex-Partner: Not on file  . Emotionally Abused: Not on file  . Physically Abused: Not on file  . Sexually Abused: Not on file    Family History  Problem Relation Age of Onset  . Hypertension Mother   . Hypertension Father   . Diabetes Maternal  Grandfather   . Diabetes Paternal Grandmother     Review of Systems  Constitutional: Negative.   HENT: Negative.   Eyes: Negative.   Respiratory: Negative.   Cardiovascular: Negative.   Gastrointestinal: Negative.   Genitourinary: Negative.   Musculoskeletal: Negative.   Skin: Negative.   Neurological: Negative.   Psychiatric/Behavioral: Negative.      Physical Exam BP 133/80   Pulse 83   Resp 16   Ht 5\' 1"  (1.549 m)   Wt 199 lb 3.2 oz (90.4 kg)   SpO2 98%   Breastfeeding Yes   BMI 37.64 kg/m   Physical Exam Constitutional:      General: She is not in acute distress.    Appearance: Normal appearance. She is well-developed.  HENT:     Head: Normocephalic and atraumatic.  Eyes:     General: No scleral icterus.    Conjunctiva/sclera: Conjunctivae normal.  Cardiovascular:     Rate and Rhythm: Normal rate and regular rhythm.     Heart sounds: No murmur heard.  No friction rub. No gallop.   Pulmonary:     Effort: Pulmonary effort is normal. No respiratory distress.     Breath sounds: Normal breath sounds. No wheezing or rales.  Abdominal:     General: Bowel sounds are normal. There is no distension.     Palpations: Abdomen is soft. There is no mass.     Tenderness: There is no abdominal tenderness. There is no guarding or rebound.     Comments: without erythema, induration, warmth, and tenderness. It is clean, dry, and intact.    Musculoskeletal:        General: Normal range of motion.     Cervical back: Normal range of motion and neck supple.  Neurological:     General: No focal deficit present.     Mental Status: She is alert and oriented to person, place, and time.     Cranial Nerves: No cranial nerve deficit.  Skin:    General: Skin is warm and dry.     Findings: No erythema.  Psychiatric:        Mood and Affect: Mood normal.        Behavior: Behavior normal.        Judgment: Judgment normal.      Female Chaperone present during breast and/or pelvic  exam.  Assessment: 34 y.o. 34 presenting for 6 week postpartum visit  Plan: Problem List Items Addressed This Visit    None    Visit Diagnoses    Postpartum care and examination    -  Primary     1) Contraception Education given regarding options for contraception, including vasectomy.  2)  Pap - ASCCP guidelines and  rational discussed.  Patient opts for routine screening interval  3) Patient underwent screening for postpartum depression with no concerns noted.  4) Follow up 1 year for routine annual exam  Thomasene Mohair, MD 02/10/2020 2:20 PM

## 2020-03-02 ENCOUNTER — Other Ambulatory Visit: Payer: Self-pay

## 2020-03-02 ENCOUNTER — Inpatient Hospital Stay: Payer: Federal, State, Local not specified - PPO | Attending: Hematology and Oncology

## 2020-03-02 ENCOUNTER — Inpatient Hospital Stay: Payer: Federal, State, Local not specified - PPO

## 2020-03-02 DIAGNOSIS — E538 Deficiency of other specified B group vitamins: Secondary | ICD-10-CM | POA: Diagnosis not present

## 2020-03-02 DIAGNOSIS — D509 Iron deficiency anemia, unspecified: Secondary | ICD-10-CM

## 2020-03-02 LAB — CBC WITH DIFFERENTIAL/PLATELET
Abs Immature Granulocytes: 0.02 10*3/uL (ref 0.00–0.07)
Basophils Absolute: 0.1 10*3/uL (ref 0.0–0.1)
Basophils Relative: 1 %
Eosinophils Absolute: 0.3 10*3/uL (ref 0.0–0.5)
Eosinophils Relative: 3 %
HCT: 41 % (ref 36.0–46.0)
Hemoglobin: 13 g/dL (ref 12.0–15.0)
Immature Granulocytes: 0 %
Lymphocytes Relative: 54 %
Lymphs Abs: 5 10*3/uL — ABNORMAL HIGH (ref 0.7–4.0)
MCH: 24.6 pg — ABNORMAL LOW (ref 26.0–34.0)
MCHC: 31.7 g/dL (ref 30.0–36.0)
MCV: 77.7 fL — ABNORMAL LOW (ref 80.0–100.0)
Monocytes Absolute: 0.5 10*3/uL (ref 0.1–1.0)
Monocytes Relative: 6 %
Neutro Abs: 3.3 10*3/uL (ref 1.7–7.7)
Neutrophils Relative %: 36 %
Platelets: 412 10*3/uL — ABNORMAL HIGH (ref 150–400)
RBC: 5.28 MIL/uL — ABNORMAL HIGH (ref 3.87–5.11)
RDW: 16.4 % — ABNORMAL HIGH (ref 11.5–15.5)
WBC: 9.2 10*3/uL (ref 4.0–10.5)
nRBC: 0 % (ref 0.0–0.2)

## 2020-03-02 MED ORDER — CYANOCOBALAMIN 1000 MCG/ML IJ SOLN
1000.0000 ug | Freq: Once | INTRAMUSCULAR | Status: AC
  Administered 2020-03-02: 1000 ug via INTRAMUSCULAR
  Filled 2020-03-02: qty 1

## 2020-03-03 LAB — FERRITIN: Ferritin: 33 ng/mL (ref 11–307)

## 2020-03-15 ENCOUNTER — Telehealth: Payer: Self-pay

## 2020-03-15 NOTE — Telephone Encounter (Signed)
Left message letting her know

## 2020-03-15 NOTE — Telephone Encounter (Signed)
-----   Message from Rosey Bath, MD sent at 03/15/2020  8:44 AM EST ----- Regarding: Please call patient  Hemoglobin has improved on oral iron.  Continue oral iron.  M ----- Message ----- From: Leory Plowman, [facility] [place] Sent: 03/02/2020   1:41 PM EST To: Rosey Bath, MD

## 2020-03-16 DIAGNOSIS — Z630 Problems in relationship with spouse or partner: Secondary | ICD-10-CM | POA: Diagnosis not present

## 2020-03-30 ENCOUNTER — Inpatient Hospital Stay: Payer: Federal, State, Local not specified - PPO | Attending: Hematology and Oncology

## 2020-04-11 DIAGNOSIS — Z630 Problems in relationship with spouse or partner: Secondary | ICD-10-CM | POA: Diagnosis not present

## 2020-04-21 NOTE — Progress Notes (Incomplete)
[facility]  [address] [place], [place] [address] Phone: [phone]  Fax: [phone]   Clinic Day:  04/21/2020  Referring physician: Rushie Chestnut, PA-C  Chief Complaint: Jenna Sims is a 35 y.o. female with a history of anemia during pregnancy who is seen for 3 month assessment.  HPI: The patient was last seen in the hematology clinic on 01/12/2020. At that time, she was doing well. She had some vaginal spotting after delivery. She was eating well. Exam was unremarkable. Hematocrit was 34.3, hemoglobin 10.3, MCV 77.8, platelets 536,000, WBC 10,000. Ferritin was 74. She received Venofer and a vitamin B12 injection. She wanted to try oral iron.  Labs on 03/02/2020 revealed a hematocrit of 41.0, hemoglobin 13.0, platelets 412,000, WBC 9,200. Ferritin was 33.  She received Vitamin B12 injections on 02/03/2020 and 03/02/2020.  During the interim, ***   Past Medical History:  Diagnosis Date  . Abdominal pain   . Depression   . Heavy menstrual bleeding   . HSV-1 infection   . Hypertension   . Lower back pain     Past Surgical History:  Procedure Laterality Date  . CESAREAN SECTION  12/30/2019   Procedure: CESAREAN SECTION;  Surgeon: Conard Novak, MD;  Location: [facility] ORS;  Service: Obstetrics;;  . DILATION AND CURETTAGE OF UTERUS    . LAPAROSCOPY N/A 11/18/2018   Procedure: LAPAROSCOPY DIAGNOSTIC;  Surgeon: Conard Novak, MD;  Location: [facility] ORS;  Service: Gynecology;  Laterality: N/A;  . WISDOM TOOTH EXTRACTION      Family History  Problem Relation Age of Onset  . Hypertension Mother   . Hypertension Father   . Diabetes Maternal Grandfather   . Diabetes Paternal Grandmother     Social History:  reports that she has never smoked. She has never used smokeless tobacco. She reports previous alcohol use of about 1.0 standard drink of alcohol per week. She reports that she does not use drugs.Denies alcohol or  tobacco. She denies exposure to radiation or toxins. She lives in [place]. 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 She is married and has two other sons named [name] (13) and [name] (7). She recently gave birth to [place].  The patient is alone*** today.  Allergies: No Known Allergies  Current Medications: Current Outpatient Medications  Medication Sig Dispense Refill  . enoxaparin (LOVENOX) 40 MG/0.4ML injection Inject 0.4 mLs (40 mg total) into the skin daily for 21 days. 8.4 mL 0  . ibuprofen (ADVIL) 600 MG tablet Take 1 tablet (600 mg total) by mouth every 6 (six) hours. 30 tablet 0  . labetalol (NORMODYNE) 100 MG tablet Take 100 mg by mouth once. 2 tabs QD    . NIFEdipine (PROCARDIA XL) 60 MG 24 hr tablet Take 1 tablet (60 mg total) by mouth daily. 30 tablet 11   No current facility-administered medications for this visit.    Review of Systems  Constitutional: Positive for weight loss (15 lbs, delivered baby). Negative for chills, diaphoresis, fever and malaise/fatigue.  HENT: Negative for congestion, ear discharge, ear pain, hearing loss, nosebleeds, sinus pain, sore throat and tinnitus.   Eyes: Negative for blurred vision.  Respiratory: Negative for cough, hemoptysis, sputum production and shortness of breath.   Cardiovascular: Negative for chest pain, palpitations and leg swelling.  Gastrointestinal: Negative for abdominal pain, blood in stool, constipation, diarrhea, heartburn, melena, nausea and vomiting.  Denies ice pica. Eating well. Appetite improved. Staying hydrated.  Genitourinary: Negative for dysuria, frequency, hematuria and urgency.       Spotting.  Musculoskeletal: Negative for back pain, joint pain, myalgias and neck pain.  Skin: Negative for itching and rash.  Neurological: Positive for dizziness (after surgery, resolved). Negative for tingling, sensory change, weakness and  headaches.       Denies restless legs.  Endo/Heme/Allergies: Does not bruise/bleed easily.  Psychiatric/Behavioral: Negative for depression and memory loss. The patient is not nervous/anxious and does not have insomnia.   All other systems reviewed and are negative.  Performance status (ECOG): 1***  Vitals currently breastfeeding.   Physical Exam Vitals and nursing note reviewed.  Constitutional:      General: She is not in acute distress.    Appearance: She is not diaphoretic.  HENT:     Head: Normocephalic and atraumatic.     Comments: Dark hair.    Mouth/Throat:     Mouth: Mucous membranes are moist.     Pharynx: Oropharynx is clear.  Eyes:     General: No scleral icterus.    Extraocular Movements: Extraocular movements intact.     Conjunctiva/sclera: Conjunctivae normal.     Pupils: Pupils are equal, round, and reactive to light.     Comments: Glasses.  Brown eyes.  Cardiovascular:     Rate and Rhythm: Normal rate and regular rhythm.     Heart sounds: Normal heart sounds. No murmur heard.   Pulmonary:     Effort: Pulmonary effort is normal. No respiratory distress.     Breath sounds: Normal breath sounds. No wheezing or rales.  Chest:     Chest wall: No tenderness.  Breasts:     Right: No axillary adenopathy or supraclavicular adenopathy.     Left: No axillary adenopathy or supraclavicular adenopathy.    Abdominal:     General: Bowel sounds are normal. There is no distension.     Palpations: Abdomen is soft. There is no hepatomegaly, splenomegaly or mass.     Tenderness: There is no abdominal tenderness. There is no guarding or rebound.  Musculoskeletal:        General: No swelling or tenderness. Normal range of motion.     Cervical back: Normal range of motion and neck supple.     Right lower leg: No edema.     Left lower leg: No edema.  Lymphadenopathy:     Head:     Right side of head: No preauricular, posterior auricular or occipital adenopathy.     Left  side of head: No preauricular, posterior auricular or occipital adenopathy.     Cervical: No cervical adenopathy.     Upper Body:     Right upper body: No supraclavicular or axillary adenopathy.     Left upper body: No supraclavicular or axillary adenopathy.     Lower Body: No right inguinal adenopathy. No left inguinal adenopathy.  Skin:    General: Skin is warm and dry.  Neurological:     Mental Status: She is alert and oriented to person, place, and time.  Psychiatric:        Behavior: Behavior normal.        Thought Content: Thought content normal.        Judgment: Judgment normal.    No visits with results within 3 Day(s) from this visit.  Latest known visit with results is:  Appointment on 03/02/2020  Component Date Value Ref Range Status  . Ferritin  03/02/2020 33  11 - 307 ng/mL Final   Performed at [facility], [address] [place] [place], [place] [address]  . WBC 03/02/2020 9.2  4.0 - 10.5 K/uL Final  . RBC 03/02/2020 5.28* 3.87 - 5.11 MIL/uL Final  . Hemoglobin 03/02/2020 13.0  12.0 - 15.0 g/dL Final  . HCT 69/67/8938 41.0  36.0 - 46.0 % Final  . MCV 03/02/2020 77.7* 80.0 - 100.0 fL Final  . MCH 03/02/2020 24.6* 26.0 - 34.0 pg Final  . MCHC 03/02/2020 31.7  30.0 - 36.0 g/dL Final  . RDW 01/23/5101 16.4* 11.5 - 15.5 % Final  . Platelets 03/02/2020 412* 150 - 400 K/uL Final  . nRBC 03/02/2020 0.0  0.0 - 0.2 % Final  . Neutrophils Relative % 03/02/2020 36  % Final  . Neutro Abs 03/02/2020 3.3  1.7 - 7.7 K/uL Final  . Lymphocytes Relative 03/02/2020 54  % Final  . Lymphs Abs 03/02/2020 5.0* 0.7 - 4.0 K/uL Final  . Monocytes Relative 03/02/2020 6  % Final  . Monocytes Absolute 03/02/2020 0.5  0.1 - 1.0 K/uL Final  . Eosinophils Relative 03/02/2020 3  % Final  . Eosinophils Absolute 03/02/2020 0.3  0.0 - 0.5 K/uL Final  . Basophils Relative 03/02/2020 1  % Final  . Basophils Absolute 03/02/2020 0.1  0.0 - 0.1 K/uL Final  . Immature Granulocytes 03/02/2020 0  %  Final  . Abs Immature Granulocytes 03/02/2020 0.02  0.00 - 0.07 K/uL Final   Performed at [facility], [address] [place], [place] [address]   Assessment:  Jenna Sims is a 35 y.o. female with iron deficiency anemia during pregnancy. She delivered on 12/30/2019.  She has a history of heavy menses.  Diet is modest.  She has ice pica and restless legs.  CBC on 11/30/2019 revealed a hematocrit 31.8, hemoglobin 9.4, MCV 72.0, platelets 339,000, WBC 11,400.   Work-up on 12/09/2019 revealed a hematocrit 29.6, hemoglobin 9.2, MCV 69.6, platelets 314,000, WBC 11,400 (ANC 7,600). Ferritin was 4 with an iron saturation of 2% and a TIBC of 477. Folate was 7.6 and Vitamin B12 was 119 (low).   She has received Venofer weekly x 3 (12/09/2019 - 12/23/2019) and 01/04/2020.    Ferritin has been followed: 4 on 12/09/2019, 74 on 01/12/2020, and 33 on 03/02/2020.  She has a B12 deficiency.  B12 was 257 on 01/30/2018 and 119 on 12/09/2019.  She has received B12 injections weekly x 4 (12/16/2019 - 01/04/2020) then monthly (last 03/02/2020).  Symptomatically, ***  Plan: 1.   Labs today: CBC with diff, ferritin, iron studies   2.   Iron deficiency anemia  Hematocrit 29.6.  Hemoglobin 9.2.  MCV 69.6 on 12/09/2019.   Ferritin was 4 with an iron saturation of 2%.    Hematocrit 34.3.  Hemoglobin 10.3.  MCV 77.8 on 01/12/2020.   Ferritin 74.  Hematocrit 41.0.  Hemoglobin 13.0.  MCV 77.7 on 03/02/2020.   Ferritin 33.  She has some vaginal spotting.  Diet is good.  Ferritin goal 100.  She would like to try oral iron. 3.   B12 deficiency  B12 was 119 on 12/09/2019.  She began B12 injections on 12/16/2019.  Continue B12 monthly. 4.   No Venofer today. 5.   B12 today and monthly x 6. 6.   RTC in 1 month for labs (CBC, ferritin) and B12. 7.   RTC in 3 months for MD assessment, labs (CBC with diff, ferritin, iron studies)  and B12.  I discussed the assessment and treatment plan with the  patient.  The patient was provided an opportunity to ask questions and all were answered.  The patient agreed with the plan and demonstrated an understanding of the instructions.  The patient was advised to call back if the symptoms worsen or if the condition fails to improve as anticipated.  I provided *** minutes of face-to-face time during this this encounter and > 50% was spent counseling as documented under my assessment and plan.  Melissa C. Merlene Pullingorcoran, MD, PhD    04/21/2020, 11:32 AM  I, Danella PentonEmily J Tufford, am acting as Neurosurgeonscribe for General MotorsMelissa C. Merlene Pullingorcoran, MD, PhD.  I, Melissa C. Merlene Pullingorcoran, MD, have reviewed the above documentation for accuracy and completeness, and I agree with the above.

## 2020-04-25 ENCOUNTER — Telehealth: Payer: Self-pay | Admitting: Hematology and Oncology

## 2020-04-25 ENCOUNTER — Inpatient Hospital Stay: Payer: Federal, State, Local not specified - PPO | Attending: Hematology and Oncology

## 2020-04-25 ENCOUNTER — Other Ambulatory Visit: Payer: Federal, State, Local not specified - PPO

## 2020-04-25 ENCOUNTER — Inpatient Hospital Stay: Payer: Federal, State, Local not specified - PPO | Admitting: Hematology and Oncology

## 2020-04-25 ENCOUNTER — Inpatient Hospital Stay: Payer: Federal, State, Local not specified - PPO

## 2020-04-25 ENCOUNTER — Ambulatory Visit: Payer: Federal, State, Local not specified - PPO | Admitting: Hematology and Oncology

## 2020-04-25 DIAGNOSIS — D509 Iron deficiency anemia, unspecified: Secondary | ICD-10-CM

## 2020-04-25 DIAGNOSIS — E538 Deficiency of other specified B group vitamins: Secondary | ICD-10-CM

## 2020-04-25 NOTE — Telephone Encounter (Signed)
04/25/2020 Left VM informing pt of missed appt. Requested call back to r/s appts  SRW

## 2020-04-27 ENCOUNTER — Inpatient Hospital Stay: Payer: Federal, State, Local not specified - PPO

## 2020-05-25 ENCOUNTER — Inpatient Hospital Stay: Payer: Federal, State, Local not specified - PPO | Attending: Hematology and Oncology

## 2020-06-20 DIAGNOSIS — Z630 Problems in relationship with spouse or partner: Secondary | ICD-10-CM | POA: Diagnosis not present

## 2020-06-22 ENCOUNTER — Inpatient Hospital Stay: Payer: Federal, State, Local not specified - PPO | Attending: Hematology and Oncology

## 2020-10-17 DIAGNOSIS — Z1159 Encounter for screening for other viral diseases: Secondary | ICD-10-CM | POA: Diagnosis not present

## 2020-10-17 DIAGNOSIS — Z0000 Encounter for general adult medical examination without abnormal findings: Secondary | ICD-10-CM | POA: Diagnosis not present

## 2020-10-17 DIAGNOSIS — Z23 Encounter for immunization: Secondary | ICD-10-CM | POA: Diagnosis not present

## 2020-10-17 DIAGNOSIS — I10 Essential (primary) hypertension: Secondary | ICD-10-CM | POA: Diagnosis not present

## 2020-10-17 DIAGNOSIS — Z131 Encounter for screening for diabetes mellitus: Secondary | ICD-10-CM | POA: Diagnosis not present

## 2020-10-24 DIAGNOSIS — F419 Anxiety disorder, unspecified: Secondary | ICD-10-CM | POA: Diagnosis not present

## 2020-10-24 DIAGNOSIS — F3340 Major depressive disorder, recurrent, in remission, unspecified: Secondary | ICD-10-CM | POA: Diagnosis not present

## 2020-11-28 DIAGNOSIS — R112 Nausea with vomiting, unspecified: Secondary | ICD-10-CM | POA: Diagnosis not present

## 2020-11-28 DIAGNOSIS — F419 Anxiety disorder, unspecified: Secondary | ICD-10-CM | POA: Diagnosis not present

## 2020-11-28 DIAGNOSIS — F3341 Major depressive disorder, recurrent, in partial remission: Secondary | ICD-10-CM | POA: Diagnosis not present

## 2020-12-26 DIAGNOSIS — F3341 Major depressive disorder, recurrent, in partial remission: Secondary | ICD-10-CM | POA: Diagnosis not present

## 2020-12-26 DIAGNOSIS — F419 Anxiety disorder, unspecified: Secondary | ICD-10-CM | POA: Diagnosis not present

## 2020-12-28 ENCOUNTER — Other Ambulatory Visit: Payer: Self-pay

## 2020-12-28 ENCOUNTER — Encounter: Payer: Self-pay | Admitting: Emergency Medicine

## 2020-12-28 ENCOUNTER — Emergency Department: Payer: Federal, State, Local not specified - PPO

## 2020-12-28 ENCOUNTER — Emergency Department
Admission: EM | Admit: 2020-12-28 | Discharge: 2020-12-28 | Disposition: A | Discharge status: Home / Self Care | Payer: Federal, State, Local not specified - PPO | Attending: Emergency Medicine | Admitting: Emergency Medicine

## 2020-12-28 DIAGNOSIS — Z7901 Long term (current) use of anticoagulants: Secondary | ICD-10-CM | POA: Insufficient documentation

## 2020-12-28 DIAGNOSIS — D649 Anemia, unspecified: Secondary | ICD-10-CM | POA: Diagnosis not present

## 2020-12-28 DIAGNOSIS — I10 Essential (primary) hypertension: Secondary | ICD-10-CM | POA: Diagnosis not present

## 2020-12-28 DIAGNOSIS — R55 Syncope and collapse: Secondary | ICD-10-CM | POA: Insufficient documentation

## 2020-12-28 DIAGNOSIS — Z79899 Other long term (current) drug therapy: Secondary | ICD-10-CM | POA: Insufficient documentation

## 2020-12-28 DIAGNOSIS — R519 Headache, unspecified: Secondary | ICD-10-CM | POA: Diagnosis not present

## 2020-12-28 LAB — CBC
HCT: 33.9 % — ABNORMAL LOW (ref 36.0–46.0)
Hemoglobin: 10.8 g/dL — ABNORMAL LOW (ref 12.0–15.0)
MCH: 22.7 pg — ABNORMAL LOW (ref 26.0–34.0)
MCHC: 31.9 g/dL (ref 30.0–36.0)
MCV: 71.4 fL — ABNORMAL LOW (ref 80.0–100.0)
Platelets: 456 10*3/uL — ABNORMAL HIGH (ref 150–400)
RBC: 4.75 MIL/uL (ref 3.87–5.11)
RDW: 16.6 % — ABNORMAL HIGH (ref 11.5–15.5)
WBC: 6.4 10*3/uL (ref 4.0–10.5)
nRBC: 0 % (ref 0.0–0.2)

## 2020-12-28 LAB — BASIC METABOLIC PANEL
Anion gap: 7 (ref 5–15)
BUN: 10 mg/dL (ref 6–20)
CO2: 26 mmol/L (ref 22–32)
Calcium: 8.7 mg/dL — ABNORMAL LOW (ref 8.9–10.3)
Chloride: 104 mmol/L (ref 98–111)
Creatinine, Ser: 0.62 mg/dL (ref 0.44–1.00)
GFR, Estimated: >60 mL/min (ref >60)
Glucose, Bld: 109 mg/dL — ABNORMAL HIGH (ref 70–99)
Potassium: 3.3 mmol/L — ABNORMAL LOW (ref 3.5–5.1)
Sodium: 137 mmol/L (ref 135–145)

## 2020-12-28 MED ORDER — FERROUS SULFATE 325 (65 FE) MG PO TBEC: 325.0000 mg | DELAYED_RELEASE_TABLET | Freq: Every day | ORAL | 0 refills | Status: DC

## 2020-12-28 NOTE — ED Notes (Signed)
E-signature not working at this time. Pt verbalized understanding of D/C instructions, prescriptions and follow up care with no further questions at this time. Pt in NAD and ambulatory at time of D/C.  

## 2020-12-28 NOTE — ED Provider Notes (Signed)
[facility] Emergency Department Provider Note  ____________________________________________   Event Date/Time   First MD Initiated Contact with Patient 12/28/20 1156     (approximate)  I have reviewed the triage vital signs and the nursing notes.   HISTORY  Chief Complaint Loss of Consciousness    HPI Jenna Sims is a 35 y.o. female  with h/o HTN, depression, here with syncopal episode. PT reports that over the past month she's had 3 episodes of loss of consciousness. She states she has been standing with each episode and it has typically occurred in the mornings. She reports she was well then bent down to put on socks this AM, then stood up and syncopized. She had some mild ear ringing around the time and slight headache. She has had this with other episodes. Reports h/o migraines but no h/o syncope. No h/o cardiac disease. Denies any CP or palpitations. No seizure like activity or loss of bowel or bladder function. She now feels back to baseline. She does have a h/o HTN and takes meds at night. No recent med changes.         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/18/2019   Supervision of high risk pregnancy, antepartum 06/17/2019   Chronic hypertension affecting pregnancy 06/17/2019   Obesity affecting pregnancy in third trimester 06/17/2019   BMI 39.0-39.9,adult 06/17/2019   Chronic pelvic pain in female  11/18/2018   Adenomyosis 01/17/2018   Menorrhagia with irregular cycle 01/08/2018   Hypertension 11/09/2016   Postpartum depression 02/10/2013   Essential hypertension in pregnancy 05/15/2012   Genital herpes 11/08/2011   Rh negative state in antepartum period, third trimester 11/08/2011   Vitamin D deficiency 08/04/2008    Past Surgical History:  Procedure Laterality Date   CESAREAN SECTION  12/30/2019   Procedure: CESAREAN SECTION;  Surgeon: Conard Novak, MD;  Location: [facility] ORS;  Service: Obstetrics;;   DILATION AND CURETTAGE OF UTERUS     LAPAROSCOPY N/A 11/18/2018   Procedure: LAPAROSCOPY DIAGNOSTIC;  Surgeon: Conard Novak, MD;  Location: [facility] ORS;  Service: Gynecology;  Laterality: N/A;   WISDOM TOOTH EXTRACTION      Prior to Admission medications   Medication Sig Start Date End Date Taking? Authorizing Provider  ferrous sulfate 325 (65 FE) MG EC tablet Take 1 tablet (325 mg total) by mouth daily with breakfast. 12/28/20 02/26/21 Yes Shaune Pollack, MD  enoxaparin (LOVENOX) 40 MG/0.4ML injection Inject 0.4 mLs (40 mg total) into the skin daily for 21 days. 01/01/20 01/22/20  [name], CNM  ibuprofen (ADVIL) 600 MG tablet Take 1 tablet (600 mg total) by mouth every 6 (six) hours. 01/01/20   [name], CNM  labetalol (NORMODYNE) 100 MG tablet Take 100 mg by mouth once. 2 tabs QD    [provider]  NIFEdipine (PROCARDIA XL) 60 MG 24 hr tablet Take 1 tablet (60 mg total)  by mouth daily. 01/01/20 12/31/20  [name], CNM    Allergies Patient has no known allergies.  Family History  Problem Relation Age of Onset   Hypertension Mother    Hypertension Father    Diabetes Maternal Grandfather    Diabetes Paternal Grandmother     Social History Social History   Tobacco Use   Smoking status: Never   Smokeless tobacco: Never  Vaping Use   Vaping Use: Never used  Substance Use Topics   Alcohol use: Not Currently    Alcohol/week: 1.0 standard  drink    Types: 1 Glasses of wine per week   Drug use: Never    Review of Systems  Review of Systems  Constitutional:  Positive for fatigue. Negative for chills and fever.  HENT:  Negative for sore throat.   Respiratory:  Negative for shortness of breath.   Cardiovascular:  Negative for chest pain.  Gastrointestinal:  Negative for abdominal pain.  Genitourinary:  Negative for flank pain.  Musculoskeletal:  Negative for neck pain.  Skin:  Negative for rash and wound.  Allergic/Immunologic: Negative for immunocompromised state.  Neurological:  Positive for syncope. Negative for weakness and numbness.  Hematological:  Does not bruise/bleed easily.    ____________________________________________  PHYSICAL EXAM:      VITAL SIGNS: ED Triage Vitals  Enc Vitals Group     BP 12/28/20 1016 (!) 122/106     Pulse Rate 12/28/20 1016 75     Resp 12/28/20 1016 17     Temp 12/28/20 1016 98.4 F (36.9 C)     Temp Source 12/28/20 1016 Oral     SpO2 12/28/20 1016 100 %     Weight 12/28/20 1013 199 lb 4.7 oz (90.4 kg)     Height 12/28/20 1013 5\' 1"  (1.549 m)     Head Circumference --      Peak Flow --      Pain Score 12/28/20 1013 6     Pain Loc --      Pain Edu? --      Excl. in GC? --      Physical Exam Vitals and nursing note reviewed.  Constitutional:      General: She is not in acute distress.    Appearance: She is well-developed.  HENT:     Head: Normocephalic and atraumatic.  Eyes:     Conjunctiva/sclera: Conjunctivae normal.  Cardiovascular:     Rate and Rhythm: Normal rate and regular rhythm.     Heart sounds: Normal heart sounds.     Comments: Slight systolic murmur appreciated. Pulmonary:     Effort: Pulmonary effort is normal. No respiratory distress.     Breath sounds: No wheezing.  Abdominal:     General: There is no distension.  Musculoskeletal:     Cervical back: Neck supple.  Skin:    General: Skin is warm.     Capillary Refill: Capillary refill takes  less than 2 seconds.     Findings: No rash.  Neurological:     Mental Status: She is alert and oriented to person, place, and time.     Motor: No abnormal muscle tone.     Comments: AOx3, no focal neuro deficits. Strength 5/5 bl UE. Normal sensation to light touch.      ____________________________________________   LABS (all labs ordered are listed, but only abnormal results are displayed)  Labs Reviewed  BASIC METABOLIC PANEL - Abnormal; Notable for the following components:  Result Value   Potassium 3.3 (*)    Glucose, Bld 109 (*)    Calcium 8.7 (*)    All other components within normal limits  CBC - Abnormal; Notable for the following components:   Hemoglobin 10.8 (*)    HCT 33.9 (*)    MCV 71.4 (*)    MCH 22.7 (*)    RDW 16.6 (*)    Platelets 456 (*)    All other components within normal limits    ____________________________________________  EKG: Normal sinus rhythm, VR 72. PR 172, QRS 84, QTc 455. No acute ST elevations or depressions No ischemia or infarct. ________________________________________  RADIOLOGY All imaging, including plain films, CT scans, and ultrasounds, independently reviewed by me, and interpretations confirmed via formal radiology reads.  ED MD interpretation:   CT Head: NAICA  Official radiology report(s): CT HEAD WO CONTRAST ( )  Result Date: 12/28/2020 CLINICAL DATA:  Syncope, recurrent; syncope, headache. Additional history provided: Patient reports syncopal episode today. EXAM: CT HEAD WITHOUT CONTRAST TECHNIQUE: Contiguous axial images were obtained from the base of the skull through the vertex without intravenous contrast. COMPARISON:  No pertinent prior exams available for comparison. FINDINGS: Brain: Cerebral volume is normal. There is no acute intracranial hemorrhage. No demarcated cortical infarct. No extra-axial fluid collection. No evidence of an intracranial mass. No midline shift. 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 Vascular: No  hyperdense vessel. Skull: Normal. Negative for fracture or focal lesion. Sinuses/Orbits: Visualized orbits show no acute finding. Mild mucosal thickening within the right maxillary sinus at the imaged levels. IMPRESSION: No evidence of acute intracranial abnormality. 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 Mild mucosal thickening within the right maxillary sinus at the imaged levels. Electronically Signed   By: Jackey Loge D.O.   On: 12/28/2020 13:52    ____________________________________________  PROCEDURES   Procedure(s) performed (including Critical Care):  Procedures  ____________________________________________  INITIAL IMPRESSION / MDM / ASSESSMENT AND PLAN / ED COURSE  As part of my medical decision making, I reviewed the following data within the electronic MEDICAL RECORD NUMBER Nursing notes reviewed and incorporated, Old chart reviewed, Notes from prior ED visits, and [place] Controlled Substance Database       *TALAYLA DOYEL was evaluated in Emergency Department on 12/28/2020 for the symptoms described in the history of present illness. She was evaluated in the context of the global COVID-19 pandemic, which necessitated consideration that the patient might be at risk for infection with the SARS-CoV-2 virus that causes COVID-19. Institutional protocols and algorithms that pertain to the evaluation of patients at risk for COVID-19 are in a state of rapid change based on information released by regulatory bodies including the CDC and federal and state organizations. These policies and algorithms were followed during the patient's care in the ED.  Some ED evaluations and interventions may be delayed as a result of limited staffing during the pandemic.*     Medical Decision Making:  35 yo F here with recurrent syncopal episodes. History is c/w possible orthostatic syncope, though ddx includes transient arrhythmia. Pt is HDS and well appearing here. EKG nonischemic and without arrhythmia. Labs show  mild anemia, which could be contributing to orthostasis, but are o/w reassuring. She c/o headache/ear ringing with these episodes which I suspect is due to her orthostasis, but CT head obtained and shows no signs of SAH, aneurysm, or other abnormality. .Given reassuring history and exam, with no signs to suggest malignant arrhythmia, severe valvular disease, or other emergent pathology, will start on  iron supplements for her anemia, refer to PCP for further syncope w/u. Pt updated and in agreement.   ____________________________________________  FINAL CLINICAL IMPRESSION(S) / ED DIAGNOSES  Final diagnoses:  Syncope, unspecified syncope type  Anemia, unspecified type     MEDICATIONS GIVEN DURING THIS VISIT:  Medications - No data to display   ED Discharge Orders          Ordered    ferrous sulfate 325 (65 FE) MG EC tablet  Daily with breakfast        12/28/20 1422             Note:  This document was prepared using Dragon voice recognition software and may include unintentional dictation errors.   Shaune Pollack, MD 12/28/20 2150

## 2020-12-28 NOTE — ED Provider Notes (Signed)
Emergency Medicine Provider Triage Evaluation Note  Jenna Sims , a 35 y.o. female  was evaluated in triage.  Pt complains of syncope. She had bent over to put her socks on and passed out. She has had several similar episodes in the past. She has a headache, but doesn't believe she hit her head. Husband heard her hit the ground and when he got to her, she was awake.   Review of Systems  Positive: Headache Negative: Chest pain, nausea, vomiting  Physical Exam  BP (!) 122/106 (BP Location: Left Arm)   Pulse 75   Temp 98.4 F (36.9 C) (Oral)   Resp 17   Ht 5\' 1"  (1.549 m)   Wt 90.4 kg   LMP 12/27/2020   SpO2 100%   BMI 37.66 kg/m  Gen:   Awake, no distress   Resp:  Normal effort  MSK:   Moves extremities without difficulty  Other:    Medical Decision Making  Medically screening exam initiated at 12:55 PM.  Appropriate orders placed.  12/29/2020 was informed that the remainder of the evaluation will be completed by another provider, this initial triage assessment does not replace that evaluation, and the importance of remaining in the ED until their evaluation is complete.   [name], FNP 12/28/20 1258    12/30/20, MD 12/28/20 [phone]

## 2020-12-28 NOTE — Discharge Instructions (Signed)
Call to set up an appointment with your PCP int he next week  For now, start taking your BP meds in the morning, instead of at night  Keep track of your blood pressure - if it's consistently below 120, cut back or hold your meds  Start the iron supplement  I'd recommend seeing your primary to discuss an outpatient echocardiogram and/or heart monitor

## 2020-12-28 NOTE — ED Triage Notes (Signed)
Pt comes into the ED via POV c/o syncopal episode today while putting on her socks.  Pt states her ears started ringing right before she passed out.  Pt currently neurologically intact at this time.  Pt denies hitting her head.

## 2020-12-29 DIAGNOSIS — R55 Syncope and collapse: Secondary | ICD-10-CM | POA: Diagnosis not present

## 2020-12-29 DIAGNOSIS — D649 Anemia, unspecified: Secondary | ICD-10-CM | POA: Diagnosis not present

## 2020-12-29 DIAGNOSIS — Z131 Encounter for screening for diabetes mellitus: Secondary | ICD-10-CM | POA: Diagnosis not present

## 2020-12-29 DIAGNOSIS — R519 Headache, unspecified: Secondary | ICD-10-CM | POA: Diagnosis not present

## 2020-12-29 DIAGNOSIS — I10 Essential (primary) hypertension: Secondary | ICD-10-CM | POA: Diagnosis not present

## 2020-12-29 DIAGNOSIS — E669 Obesity, unspecified: Secondary | ICD-10-CM | POA: Diagnosis not present

## 2020-12-30 DIAGNOSIS — R55 Syncope and collapse: Secondary | ICD-10-CM | POA: Diagnosis not present

## 2021-01-02 DIAGNOSIS — G43001 Migraine without aura, not intractable, with status migrainosus: Secondary | ICD-10-CM | POA: Diagnosis not present

## 2021-01-02 DIAGNOSIS — D649 Anemia, unspecified: Secondary | ICD-10-CM | POA: Diagnosis not present

## 2021-01-02 DIAGNOSIS — R55 Syncope and collapse: Secondary | ICD-10-CM | POA: Diagnosis not present

## 2021-01-02 DIAGNOSIS — F3341 Major depressive disorder, recurrent, in partial remission: Secondary | ICD-10-CM | POA: Diagnosis not present

## 2021-01-30 DIAGNOSIS — R55 Syncope and collapse: Secondary | ICD-10-CM | POA: Diagnosis not present

## 2021-01-31 DIAGNOSIS — H538 Other visual disturbances: Secondary | ICD-10-CM | POA: Diagnosis not present

## 2021-01-31 DIAGNOSIS — F419 Anxiety disorder, unspecified: Secondary | ICD-10-CM | POA: Diagnosis not present

## 2021-01-31 DIAGNOSIS — I10 Essential (primary) hypertension: Secondary | ICD-10-CM | POA: Diagnosis not present

## 2021-01-31 DIAGNOSIS — R55 Syncope and collapse: Secondary | ICD-10-CM | POA: Diagnosis not present

## 2021-02-01 DIAGNOSIS — R29818 Other symptoms and signs involving the nervous system: Secondary | ICD-10-CM | POA: Diagnosis not present

## 2021-02-01 DIAGNOSIS — R519 Headache, unspecified: Secondary | ICD-10-CM | POA: Diagnosis not present

## 2021-02-01 DIAGNOSIS — R55 Syncope and collapse: Secondary | ICD-10-CM | POA: Diagnosis not present

## 2021-02-02 DIAGNOSIS — R55 Syncope and collapse: Secondary | ICD-10-CM | POA: Diagnosis not present

## 2021-02-07 IMAGING — US US MFM OB COMP +14 WKS
1 series · 16 of 28 positions shown · non-contrast
Comparison: none

[Series 1: us mfm ob comp +14 wks · 16 of 117 slices shown]
[im 1/117]
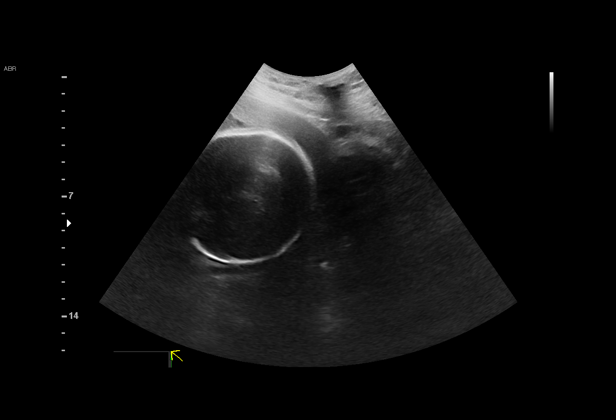
[im 9/117]
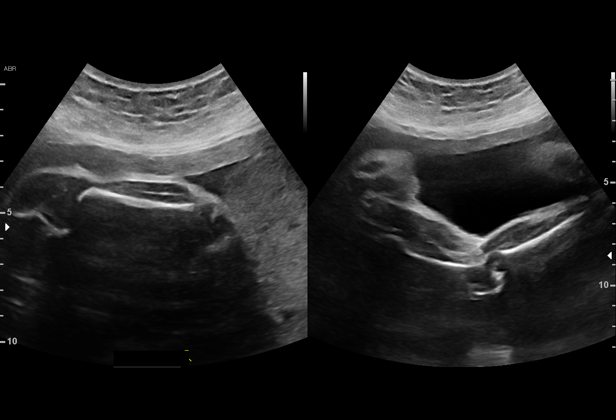
[im 18/117]
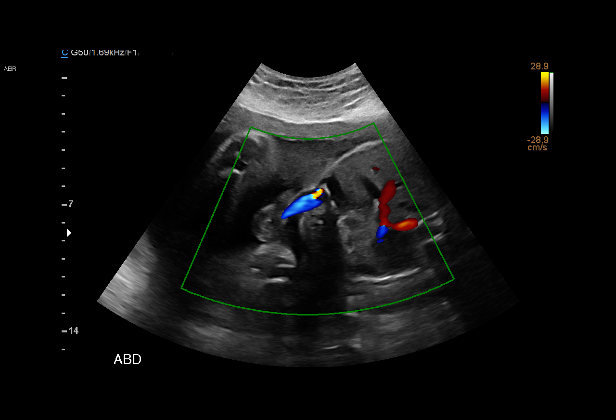
[im 26/117]
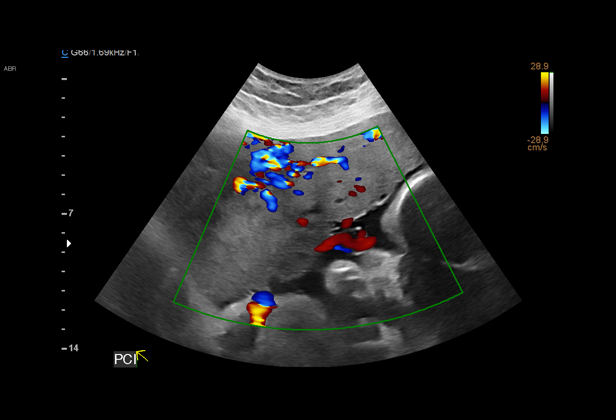
[im 31/117]
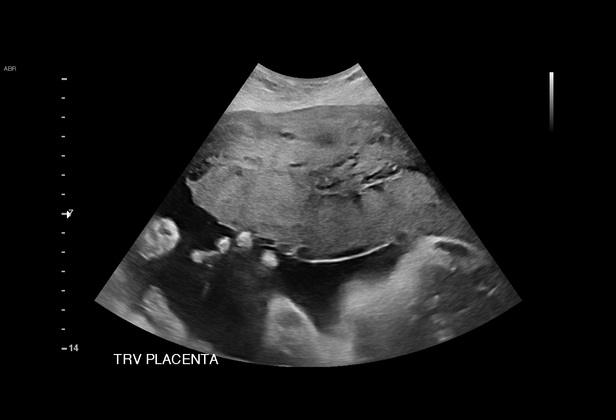
[im 39/117]
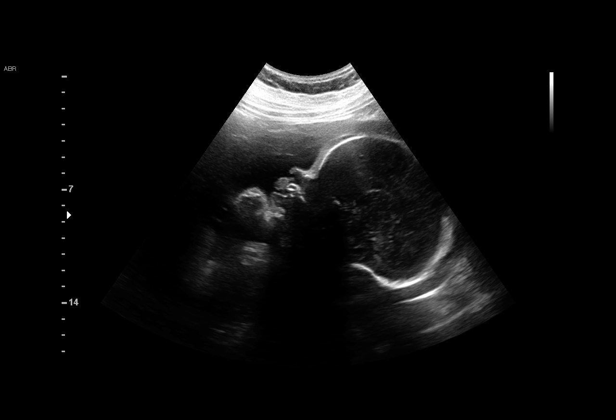
[im 48/117]
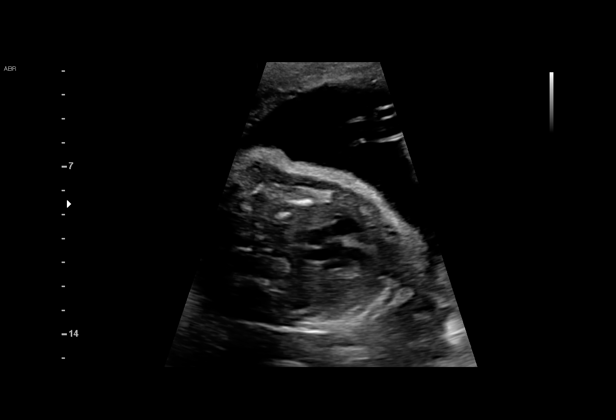
[im 56/117]
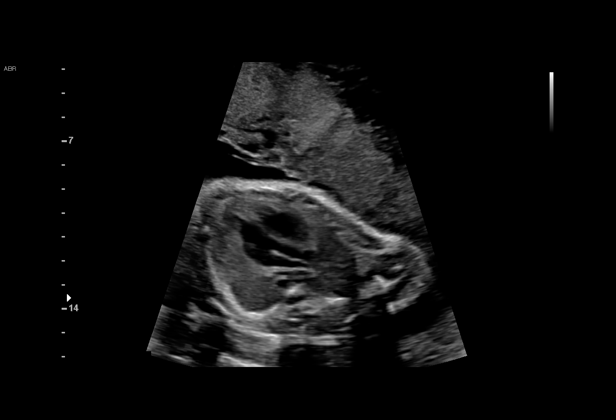
[im 61/117]
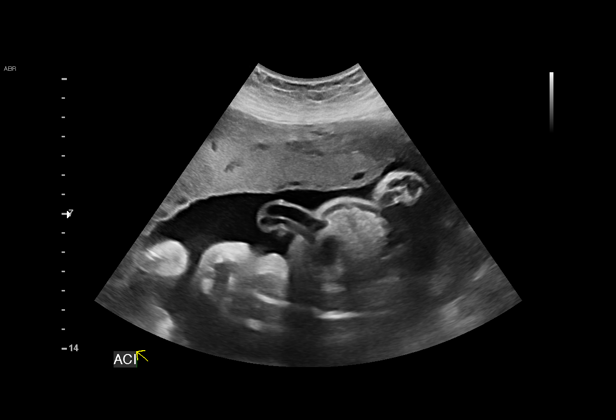
[im 69/117]
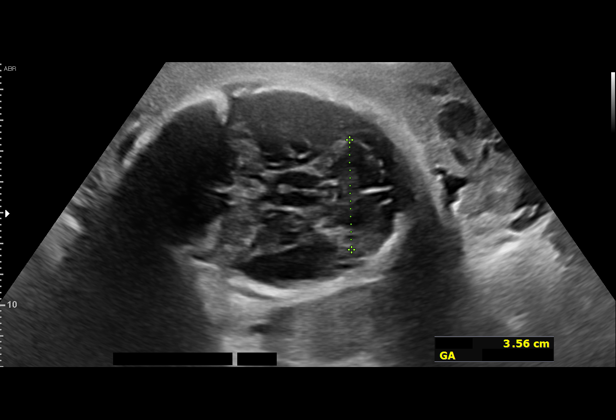
[im 78/117]
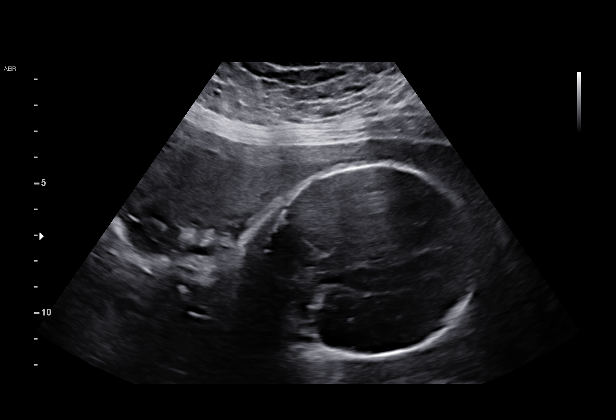
[im 86/117]
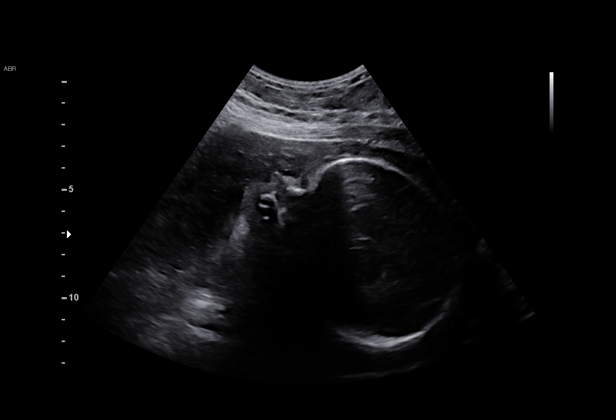
[im 91/117]
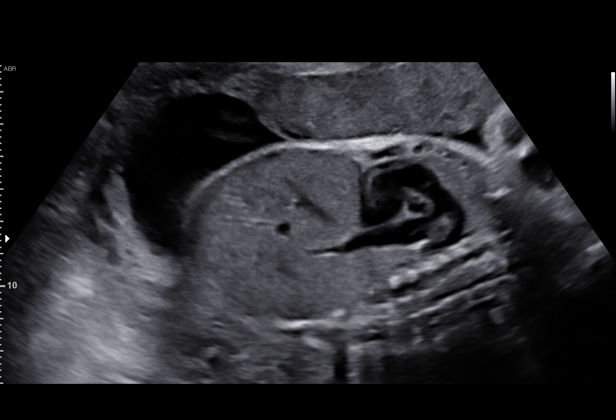
[im 99/117]
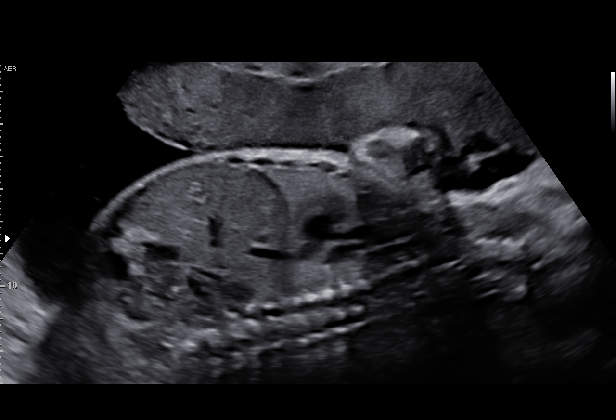
[im 108/117]
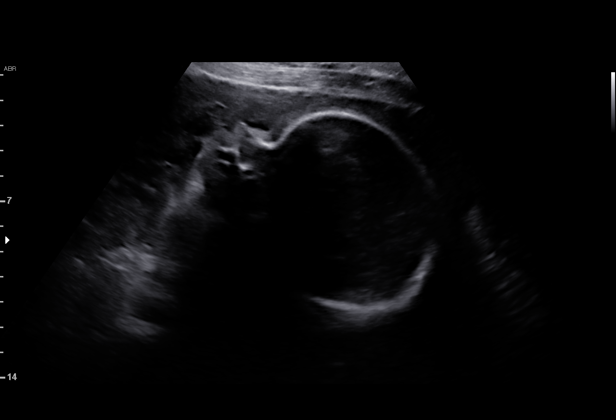
[im 117/117]
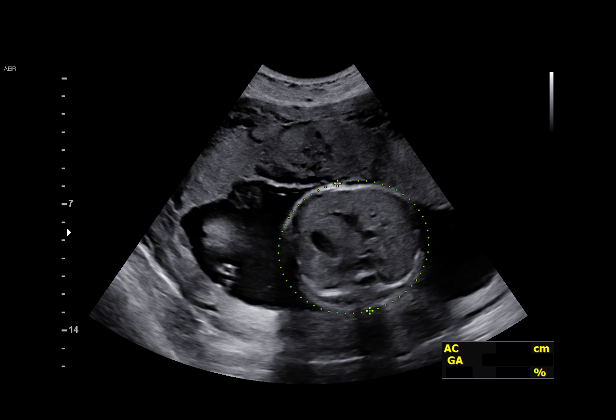

[16 of 28 positions shown; findings below may reference images not displayed]

at [HOSPITAL]
                                                             Es
                   [facility]

 1  US MFM OB COMP + 14 WK                [address].01    [facility]

Indications

 Shortened long bones - Upper
 Shortened long bones - Lower
 30 weeks gestation of pregnancy
Fetal Evaluation

 Num Of Fetuses:          1
 Fetal Heart              135
 Rate(bpm):
 Cardiac Activity:        Observed
 Presentation:            Cephalic
 Placenta:                Anterior
 P. Cord Insertion:       Visualized, central
Biometry

 BPD:      75.4  mm     G. Age:  30w 2d         41  %    CI:         69.38  %    70 - 86
                                                         FL/HC:       15.0  %    19.2 -
 HC:        289  mm     G. Age:  31w 6d         63  %    HC/AC:       1.13       0.99 -
 AC:      255.7  mm     G. Age:  29w 5d         33  %    FL/BPD:      57.6  %    71 - 87
 FL:       43.4  mm     G. Age:  24w 2d        < 1  %    FL/AC:       17.0  %    20 - 24
 HUM:      39.9  mm     G. Age:  24w 2d        < 5  %
 CER:      35.6  mm     G. Age:  29w 5d         21  %

 LV:        6.9  mm

 Est. FW:    9925   g    2 lb 10 oz     2.3  %
                    m
Gestational Age

 LMP:           30w 1d        Date:  04/15/19                 EDD:    01/20/20
 U/S Today:     29w 0d                                        EDD:    01/28/20
 Best:          30w 1d     Det. By:  LMP  (04/15/19)          EDD:    01/20/20
Anatomy

 Cranium:               Appears normal         Aortic Arch:            Appears normal
 Cavum:                 Appears normal         Ductal Arch:            Appears normal
 Ventricles:            Appears normal         Diaphragm:              Appears normal
 Choroid Plexus:        Appears normal         Stomach:                Appears normal,
                                                                       left sided
 Cerebellum:            Appears normal         Abdomen:                Appears normal
 Posterior Fossa:       Appears normal         Abdominal Wall:         Appears nml (cord
                                                                       insert, abd wall)
 Nuchal Fold:           Not applicable (>20    Cord Vessels:           Appears normal (3
                        wks GA)                                        vessel cord)
 Face:                  Abnormal, see          Kidneys:                Appear normal
                        comments
 Lips:                  Appears normal         Bladder:                Appears normal
 Heart:                 Appears normal;        Spine:                  Not well visualized
                        EIF
 RVOT:                  Appears normal         Upper Extremities:      Skeletal Dysplasia
 LVOT:                  Appears normal         Lower Extremities:      Skeletal Dysplasia

 Other:  Long bones measuring short.
Doppler - Fetal Vessels

 Umbilical Artery
   S/D    %tile      RI    %tile      PI    %tile            ADFV    RDFV
  2.38       23    0.58       22    0.87       32               No      No

Comments

 This patient was seen in consultation due to chronic
 hypertension currently treated with labetalol 400 mg twice a
 day and shortened long bones that were noted on her prior
 ultrasound exams.  The patient reports that her blood
 pressures have been elevated ever since her last pregnancy
 about 7 years ago.  She was treated with lisinopril and
 hydrochlorothiazide for management of her blood pressures
 outside of pregnancy.  She is taking a daily baby aspirin for
 preeclampsia prophylaxis.  She denies any other significant
 past medical history and denies any problems in her current
 pregnancy.

 The patient's blood pressures in our office today was
 154/106 and 161/107.  She reports that she has felt a slight
 headache over the past 2 days.  She reports feeling vigorous
 fetal movements throughout the day.

 The patient reports that she has not had a screening test
 drawn for detection of fetal aneuploidy in her current
 pregnancy.

 On today's ultrasound exam, the overall EFW measures at
 the 2nd percentile for her gestational age.  The lower fetal
 weight is primarily due to shortened long bones (humerus
 and femur lengths).  There was normal amniotic fluid noted
 today.

 An echogenic focus was noted in the left ventricle of the fetal
 heart.  The small association between an echogenic focus
 and Down syndrome was discussed today.

 Doppler studies of the umbilical arteries performed today
 showed a normal S/D ratio of 2.38.  There were no signs of
 absent or reversed end-diastolic flow noted today.

 Frontal bossing is also noted on the views of the fetal profile.
 The patient was advised that due to the shortened long
 bones and the frontal bossing noted on today's exam, there
 is a high likelihood that her baby has achondroplasia.  She
 was advised that achondroplasia is the most common form
 of dwarfism.  It is usually inherited in an autosomal dominant
 fashion.  Most cases of achondroplasia result from new
 mutations. Therefore, there may not be a family history of
 dwarfism.  People with achondroplasia generally live a
 normal life and will probably have normal intelligence.
 Treatments for achondroplasia may include support groups
 and possibly growth hormones.

 As achondroplasia is caused by a mutation in the fibroblast
 growth factor receptor 3 (V0VDD) gene, prenatal diagnosis
 via an amniocentesis is possible.  The patient was offered an
 amniocentesis today.  As prenatal diagnosis would not
 change the outcome or management of her pregnancy, she
 will discuss the option for an amniocentesis with her
 husband.  Our genetic counselor will contact the patient soon
 to schedule a genetic counseling session.

 The patient was advised that although unlikely, as the
 umbilical arteries Doppler studies continue to show normal
 forward flow, fetal growth restriction possibly due to
 uncontrolled blood pressures has not been ruled out.  We will
 continue to follow her with weekly umbilical artery Doppler
 studies and weekly fetal testing to assess the fetal status.

 Due to the severe range blood pressures noted today, the
 patient was sent to labor and delivery immediately following
 today's ultrasound exam for evaluation.  She should have a
 P/C ratio assessed, PIH labs, and an NST.  Should her blood
 pressures remain extremely elevated, consideration should
 be given to adding Procardia 60 mg XL daily to her
 antihypertensive medication regimen.  The dosage of
 Procardia may have to be increased to 90 mg daily should
 her blood pressures continue to be uncontrolled.
 Preeclampsia precautions were reviewed with the patient
 today.  Should she be diagnosed with preeclampsia, delivery
 may have to occur at between 34 to 37 weeks depending on
 her blood pressure control.  Should she require a preterm
 delivery, a course of antenatal corticosteroids should be
 administered.

 A follow-up exam was scheduled in our office in 1 week for
 fetal assessment.

 A total of 30 minutes was spent counseling and coordinating
 the care for this patient.

## 2021-02-13 DIAGNOSIS — G43001 Migraine without aura, not intractable, with status migrainosus: Secondary | ICD-10-CM | POA: Diagnosis not present

## 2021-02-13 DIAGNOSIS — R55 Syncope and collapse: Secondary | ICD-10-CM | POA: Diagnosis not present

## 2021-02-17 DIAGNOSIS — Z6836 Body mass index (BMI) 36.0-36.9, adult: Secondary | ICD-10-CM | POA: Diagnosis not present

## 2021-02-17 DIAGNOSIS — R55 Syncope and collapse: Secondary | ICD-10-CM | POA: Diagnosis not present

## 2021-02-28 IMAGING — US US MFM FETAL BPP W/O NON-STRESS
1 series · 13 of 28 positions shown · non-contrast
Comparison: none

[Series 1: us mfm fetal bpp w/o non-stress · 61 acquisitions, 13 frames shown]
[im 3/61]
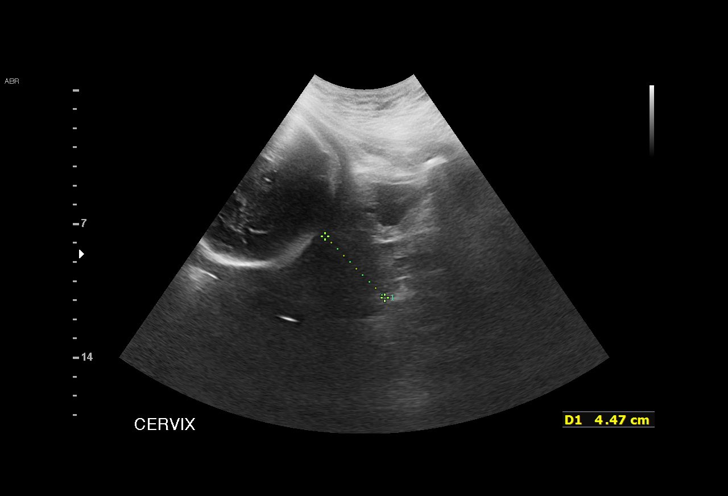
[im 7/61]
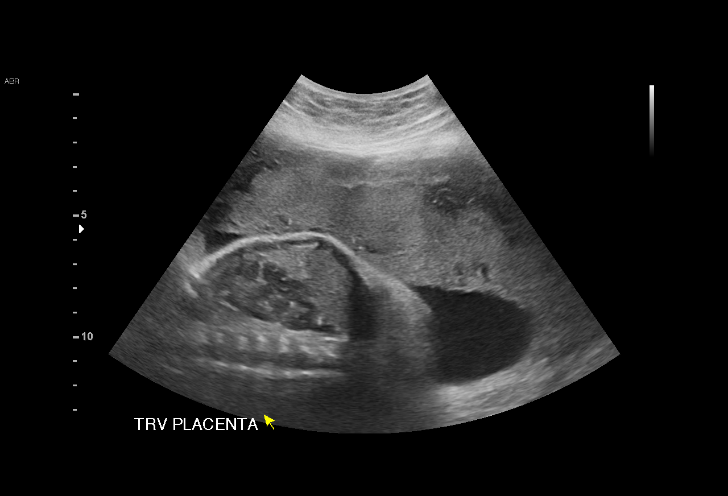
[im 12/61]
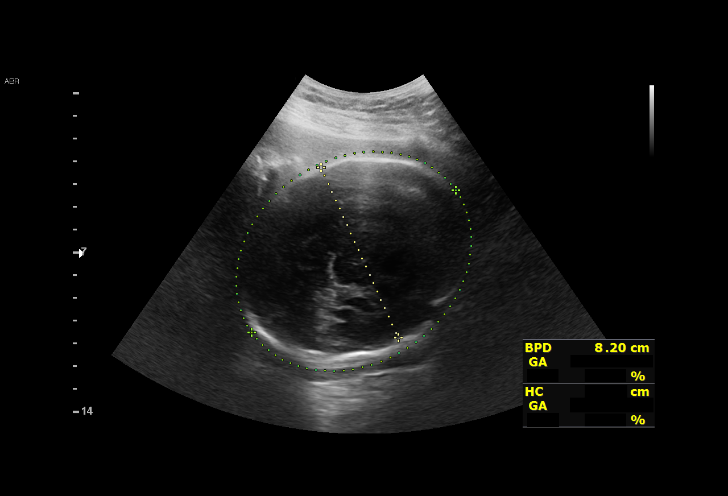
[im 16/61]
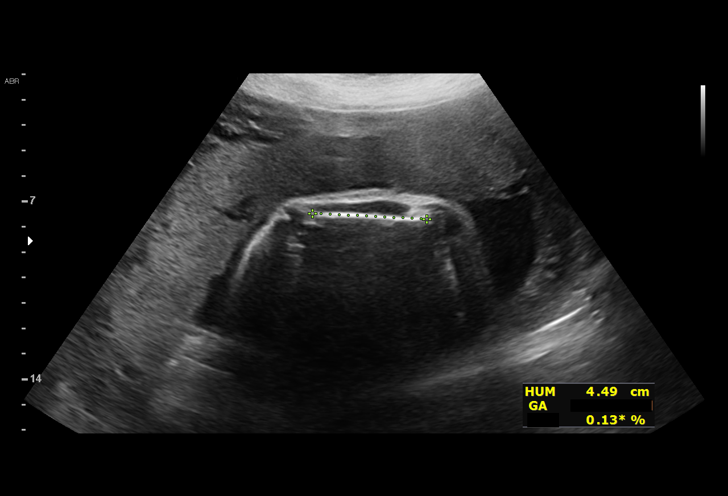
[im 21/61]
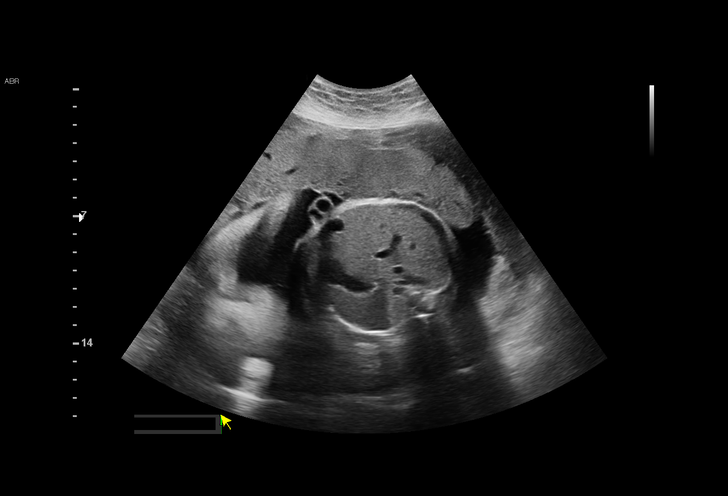
[im 25/61]
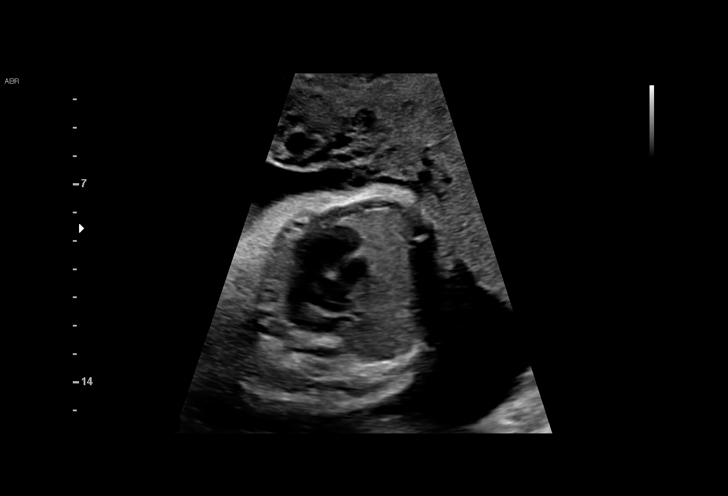
[im 32/61]
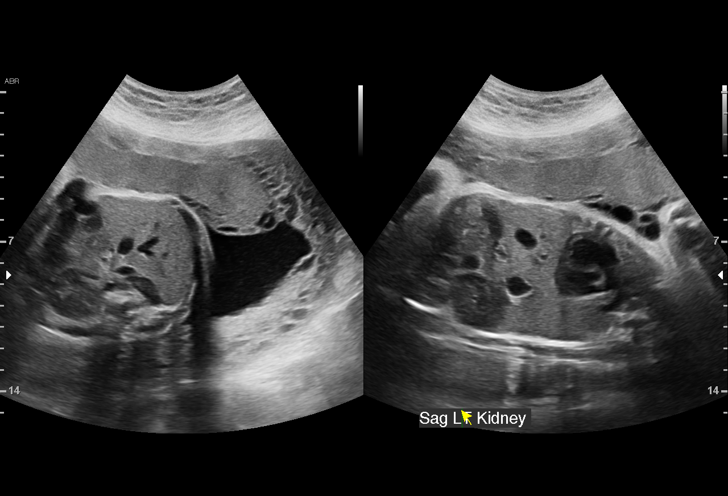
[im 36/61]
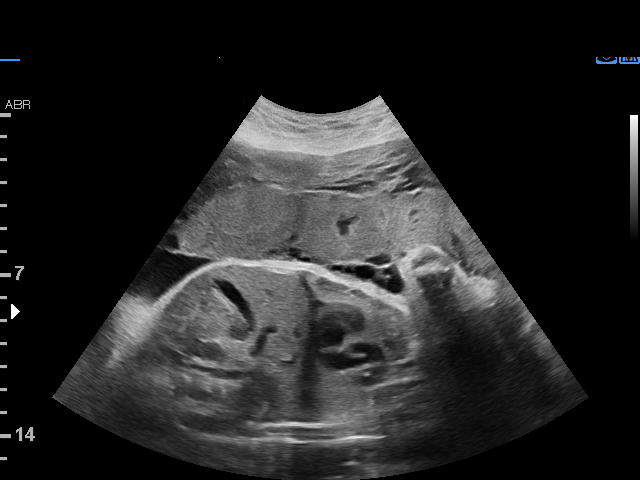
[im 41/61]
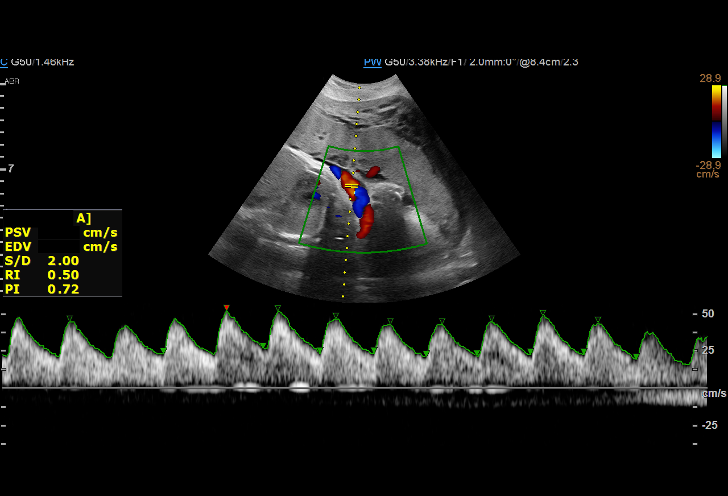
[im 45/61]
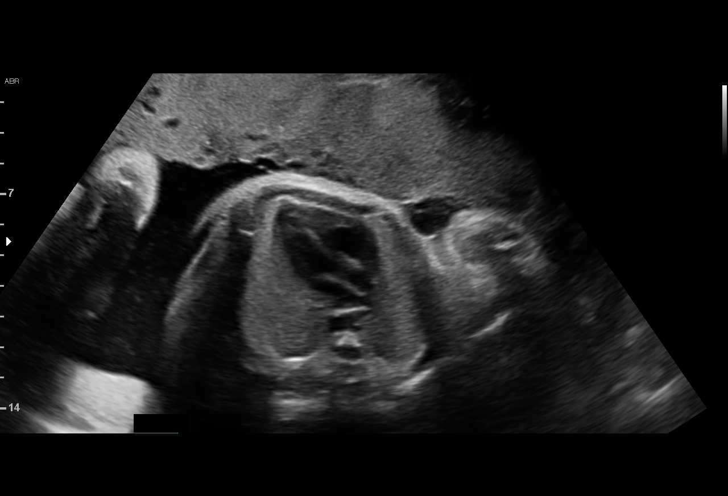
[im 49/61]
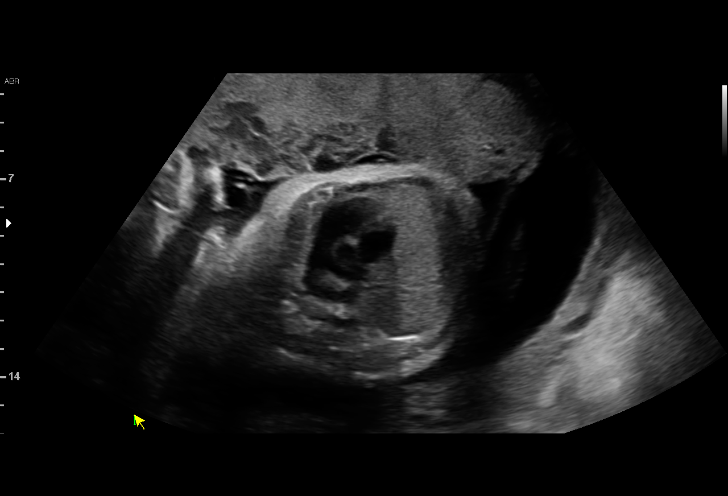
[im 54/61]
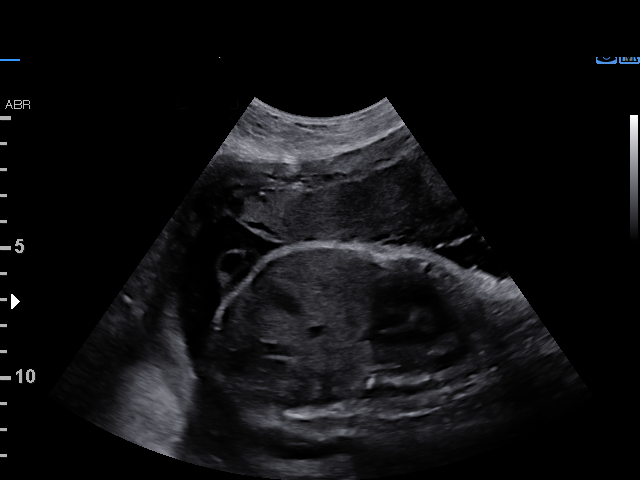
[im 58/61]
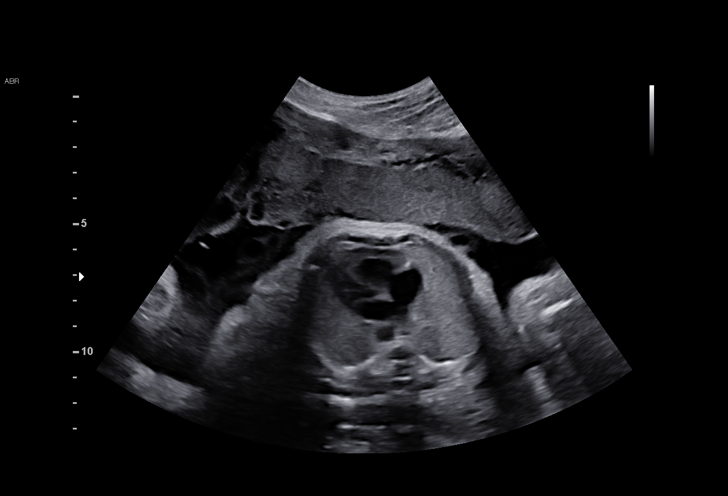

[13 of 28 positions shown; findings below may reference images not displayed]

Name:        SUSSAN SAPP              Visit Date: 12/03/2019 [DATE]

                                                              Maternal [HOSPITAL]
                                                              at [HOSPITAL] [facility]
                    [facility]

 2   US MFM UA CORD DOPPLER               76820.02     BELA GORDO

Indications

 Shortened long bones - Upper
 Shortened long bones - Lower
 Obesity complicating pregnancy, third trimester
 33 weeks gestation of pregnancy
Fetal Evaluation

 Num Of Fetuses:          1
 Fetal Heart Rate(bpm):   145
 Cardiac Activity:        Observed
 Presentation:            Cephalic
 Placenta:                Anterior
 P. Cord Insertion:       Previously Visualized

 AFI Sum(cm)     %Tile       Largest Pocket(cm)
 13              40

 RUQ(cm)       RLQ(cm)        LUQ(cm)        LLQ(cm)

Biophysical Evaluation
 Amniotic F.V:   Within normal limits        F. Tone:         Observed
 F. Movement:    Observed                    Score:           [DATE]
 F. Breathing:   Observed
Biometry

 BPD:      82.5   mm     G. Age:  33w 1d         45  %    CI:         72.05  %    70 - 86
                                                          FL/HC:       15.1  %    19.9 -
 HC:      309.3   mm     G. Age:  34w 4d         49  %    HC/AC:       1.13       0.96 -
 AC:      274.2   mm     G. Age:  31w 3d         11  %    FL/BPD:      56.6  %    71 - 87
 FL:       46.7   mm     G. Age:  25w 4d        < 1  %    FL/AC:       17.0  %    20 - 24
 HUM:      44.8   mm     G. Age:  26w 4d        < 5  %

 LV:         4.1  mm

 Est. FW:    1381   gm      3 lb 5 oz    < 1  %
Gestational Age

 LMP:            33w 1d       Date:  04/15/19                   EDD:  01/20/20
 U/S Today:      31w 1d                                         EDD:  02/03/20
 Best:           33w 1d    Det. By:  LMP  (04/15/19)            EDD:  01/20/20
Anatomy

 Cranium:                Appears normal         Aortic Arch:            Previously seen
 Cavum:                  Previously seen        Ductal Arch:            Appears normal
 Ventricles:             Appears normal         Diaphragm:              Appears normal
 Choroid Plexus:         Previously seen        Stomach:                Appears normal, left
                                                                        sided
 Cerebellum:             Previously seen        Abdomen:                Previously seen
 Posterior Fossa:        Previously seen        Abdominal Wall:         Previously seen
 Nuchal Fold:            Previously seen        Cord Vessels:           Previously seen
 Face:                   Orbits and profile     Kidneys:                Appear normal
                         previously seen
 Lips:                   Previously seen        Bladder:                Appears normal
 Heart:                  Appears normal         Spine:                  Previously seen
                         (4CH, axis, and situs)
 RVOT:                   Appears normal         Upper Extremities:      Appears normal
 LVOT:                   Appears normal         Lower Extremities:      Appears normal
Doppler - Fetal Vessels

 Umbilical Artery
   S/D     %tile      RI    %tile      PI    %tile            ADFV    RDFV
  2.35        34   0.57        36   0.85        44               No      No

Comments

 This patient was seen for a follow up growth scan due to fetal
 growth restriction, chronic hypertension, and a fetus with
 probable achondroplasia.  The patient is currently treated with
 Procardia 60 mg daily and labetalol 400 mg twice a day.  Her
 blood pressures remain in the 140s to 150s over 80s to 90s
 range.  She denies any signs or symptoms of severe
 preeclampsia and reports feeling vigorous fetal movements
 throughout the day.  Due to maternal anemia, she has been
 referred to hematology for IV iron infusions.
 On today's exam, the EFW measures at less than the first
 percentile for her gestational age indicating severe fetal growth
 restriction.  The fetus has grown over half a pound over the
 past 3 weeks.  The lower fetal weight is primarily due to the
 shortened long bones.  There was normal amniotic fluid noted.
 A biophysical profile performed today due to fetal growth
 restriction was [DATE].
 Doppler studies of the umbilical arteries showed a normal S/D
 ratio of 2.35.  There were no signs of absent or reversed end-
 diastolic flow.
 The patient reports that although she works from home, she is
 still under a lot of stress due to work related issues.  As her
 blood pressures remain elevated, she may benefit from being
 off of work completely.

 Due to fetal growth restriction and her elevated blood
 pressures, our goal for delivery would be at between 36 to 37
 weeks.  Preeclampsia precautions were reviewed again.  The
 patient should receive a complete course of antenatal
 corticosteroids should she be at risk for delivery prior to 37
 weeks.

 We will continue to follow her closely with weekly fetal testing
 and umbilical artery Doppler studies.
 Another exam was scheduled in our office in 1 week.

## 2021-03-01 DIAGNOSIS — R55 Syncope and collapse: Secondary | ICD-10-CM | POA: Diagnosis not present

## 2021-03-01 DIAGNOSIS — G43001 Migraine without aura, not intractable, with status migrainosus: Secondary | ICD-10-CM | POA: Diagnosis not present

## 2021-03-01 DIAGNOSIS — I10 Essential (primary) hypertension: Secondary | ICD-10-CM | POA: Diagnosis not present

## 2021-03-21 IMAGING — US US MFM OB FOLLOW-UP
2 series · 13 of 28 positions shown · non-contrast
Comparison: none

[Series 1: us mfm ob follow-up · 42 acquisitions, 11 frames shown (1 of 2)]
[im 2/42]
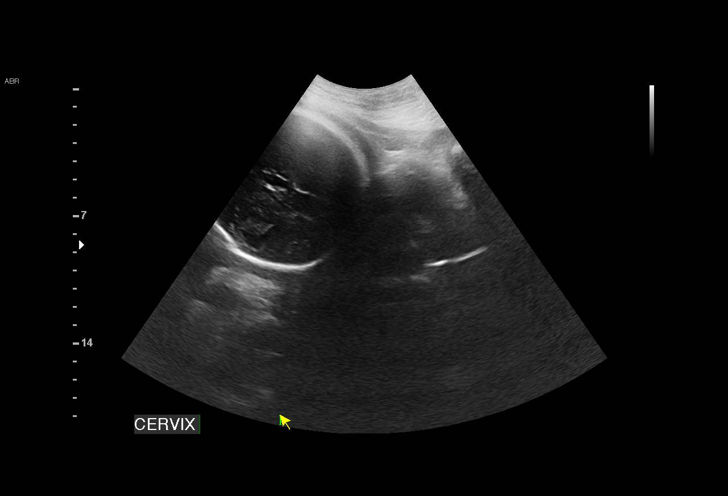
[im 6/42]
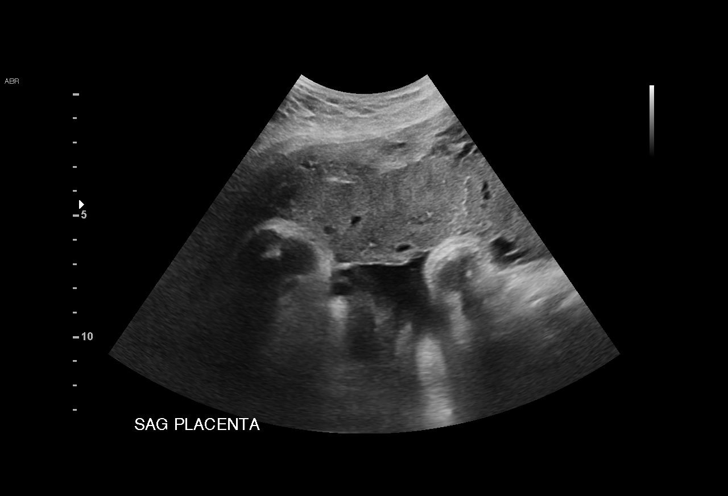
[im 9/42]
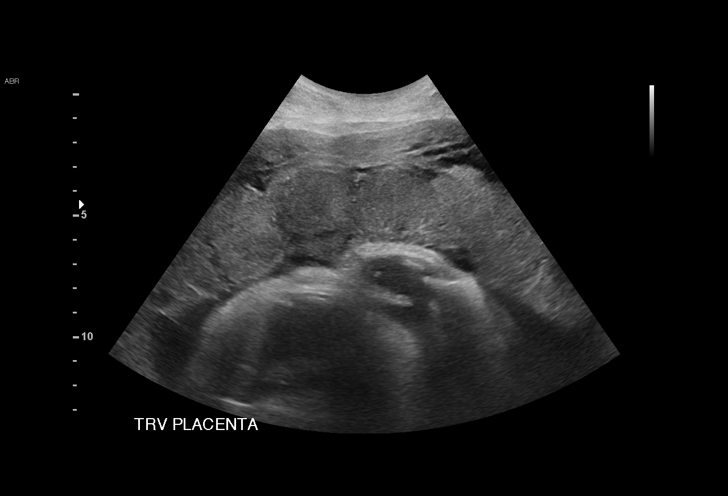
[im 13/42]
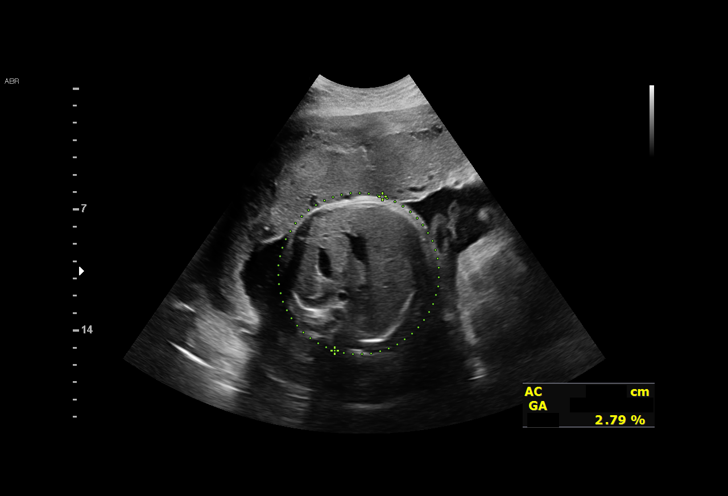
[im 17/42]
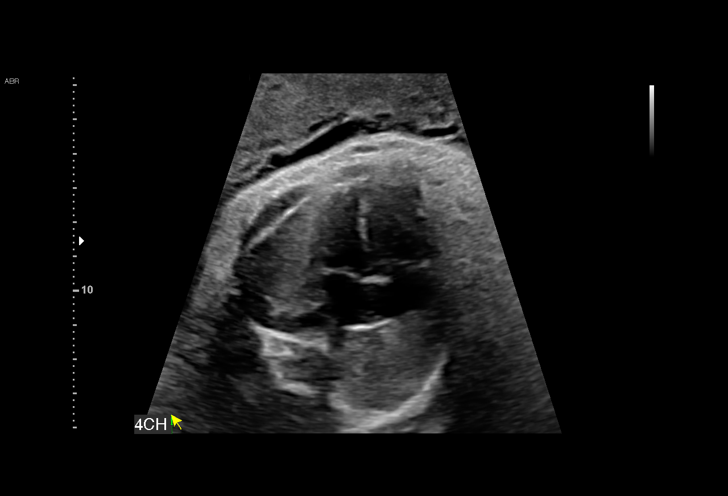
[im 20/42]
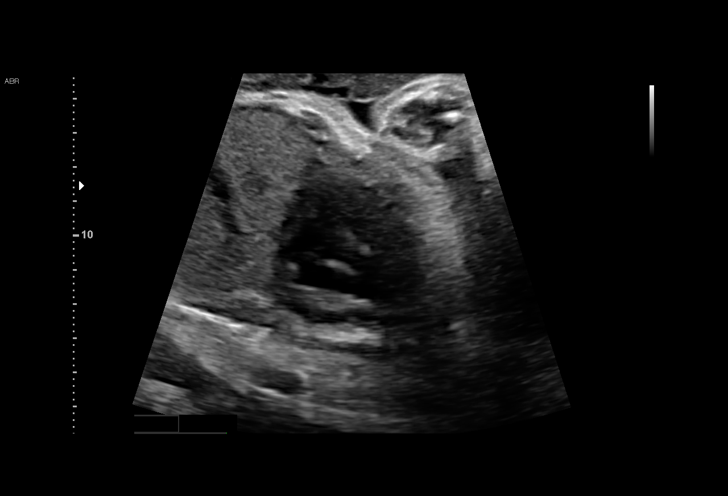
[im 25/42]
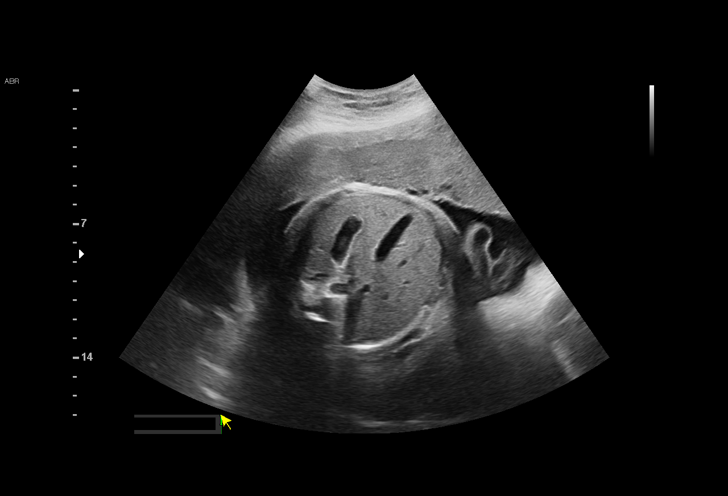
[im 29/42]
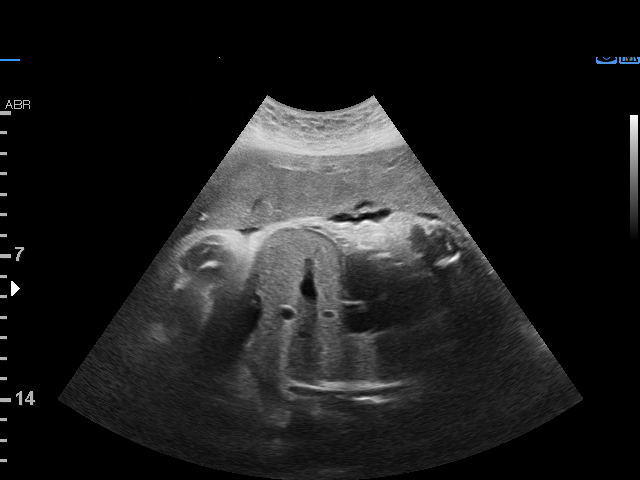
[im 33/42]
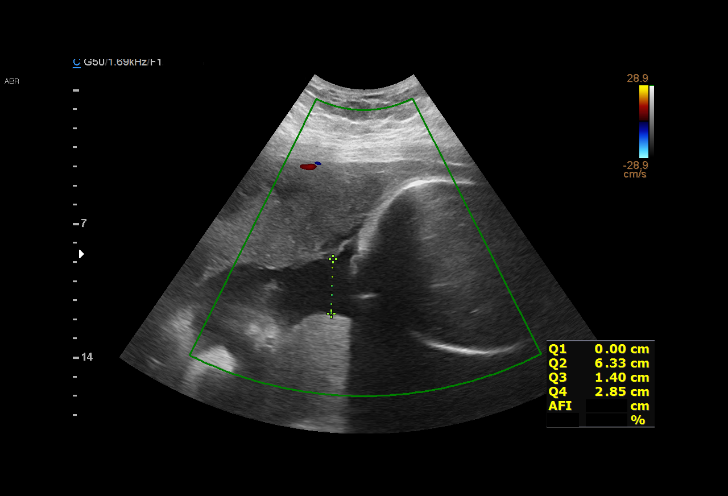
[im 36/42]
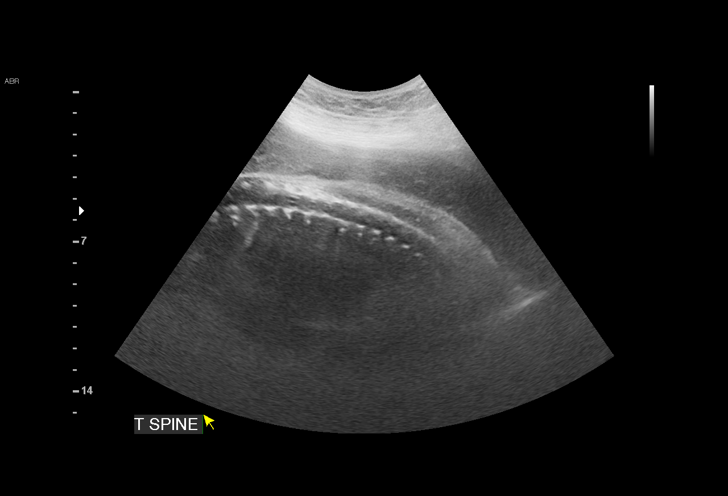
[im 40/42]
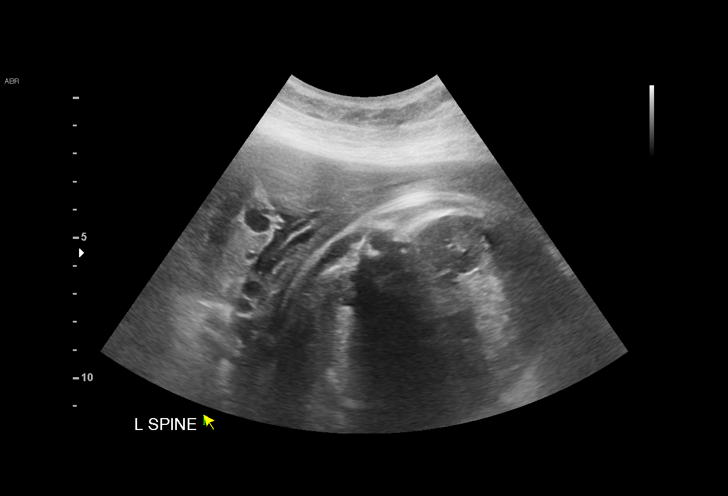

[Series 3: us mfm ob follow-up · 2 of 8 slices shown (2 of 2)]
[im 1/8]
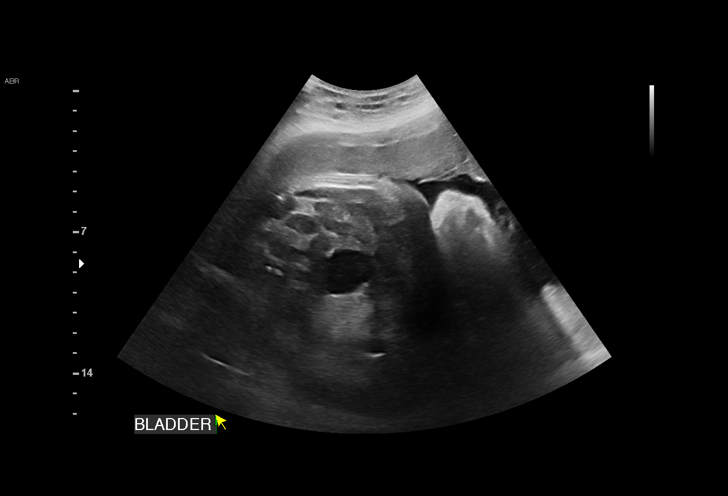
[im 5/8]
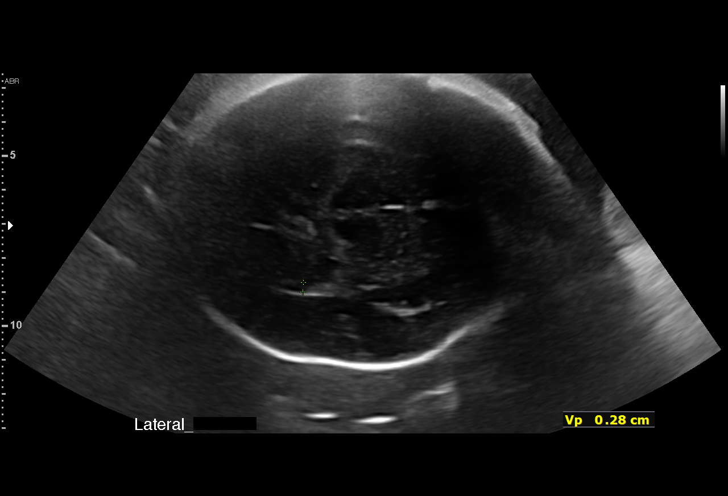

[13 of 28 positions shown; findings below may reference images not displayed]

Maternal [HOSPITAL]
                                                              at [REDACTED]
                    [facility]

 1   US MFM UA CORD DOPPLER               76820.02     ROADSTER ZIZI

Indications

 Shortened long bones - Lower
 Shortened long bones - Upper
 Hypertension - Chronic/Pre-existing
 Obesity complicating pregnancy
 36 weeks gestation of pregnancy
Fetal Evaluation

 Num Of Fetuses:          1
 Fetal Heart Rate(bpm):   147
 Cardiac Activity:        Observed
 Presentation:            Cephalic
 Placenta:                Anterior
 P. Cord Insertion:       Previously Visualized

 AFI Sum(cm)     %Tile       Largest Pocket(cm)
 10.6            27

 RUQ(cm)       RLQ(cm)        LUQ(cm)        LLQ(cm)
 0
Biophysical Evaluation

 Amniotic F.V:   Within normal limits        F. Tone:         Observed
 F. Movement:    Observed                    Score:           [DATE]
 F. Breathing:   Observed
Biometry

 BPD:      89.2   mm     G. Age:  36w 1d         58  %    CI:         77.95  %    70 - 86
                                                          FL/HC:       15.5  %    20.1 -
 HC:      319.7   mm     G. Age:  36w 0d         18  %    HC/AC:       1.08       0.93 -
 AC:      295.7   mm     G. Age:  33w 4d        4.4  %    FL/BPD:      55.4  %    71 - 87
 FL:       49.4   mm     G. Age:  26w 5d        < 1  %    FL/AC:       16.7  %    20 - 24
 HUM:      48.4   mm     G. Age:  28w 3d        < 5  %

 LV:         2.8  mm

 Est. FW:    5227   gm      4 lb 2 oz    < 1  %
Gestational Age

 LMP:            36w 1d       Date:  04/15/19                   EDD:  01/20/20
 U/S Today:      33w 1d                                         EDD:  02/10/20
 Best:           36w 1d    Det. By:  LMP  (04/15/19)            EDD:  01/20/20
Anatomy

 Cranium:                Previously seen        Aortic Arch:            Previously seen
 Cavum:                  Previously seen        Ductal Arch:            Previously seen
 Ventricles:             Appears normal         Diaphragm:              Appears normal
 Choroid Plexus:         Previously seen        Stomach:                Appears normal, left
                                                                        sided
 Cerebellum:             Previously seen        Abdomen:                Appears normal
 Posterior Fossa:        Previously seen        Abdominal Wall:         Previously seen
 Nuchal Fold:            Previously seen        Cord Vessels:           Previously seen
 Face:                   Orbits and profile     Kidneys:                Appear normal
                         previously seen
 Lips:                   Previously seen        Bladder:                Appears normal
 Heart:                  Appears normal         Spine:                  Appears normal
                         (4CH, axis, and situs)
 RVOT:                   Previously seen        Upper Extremities:      Previously seen,
                                                                        shortened long bone
 LVOT:                   Previously seen        Lower Extremities:      Previously seen,
                                                                        shortened long bone
Doppler - Fetal Vessels

 Umbilical Artery
   S/D     %tile      RI    %tile                             ADFV    RDFV
  3.01        83   0.67        87                                No      No

Comments

 This patient was seen for a follow up growth scan due to fetal
 growth restriction noted during her prior ultrasound exams, due
 to probable fetal achondroplasia.  She denies any problems
 since her last exam and reports feeling vigorous fetal
 movements throughout the day.
 On today's exam, the overall EFW continues to measure at less
 than the 1st percentile for her gestational age, indicating fetal
 growth restriction.  The fetus has grown about three quarters
 of a pound over the past 3 weeks.  The low estimated fetal
 weight is primarily due to the shortened long bones.  She was
 advised regarding the possible inaccuracies of the estimation of
 fetal weight using ultrasound due to the shortened long bones.
 A biophysical profile performed today due to fetal growth
 restriction was [DATE].
 Doppler studies of the umbilical arteries showed a normal S/D
 ratio of 3.01. There were no signs of absent or reversed end-
 diastolic flow.
 Due to fetal growth restriction, she is already scheduled for
 delivery next week at 37 weeks.
 No further exams were scheduled in our office.  Her baby
 should be tested after birth to determine if achondroplasia is the
 cause of the shortened long bones noted during her prenatal
 ultrasounds.

## 2023-09-05 NOTE — H&P (Signed)
 Preoperative History and Physical  Chief Complaint: Jenna Sims is a 38 y.o. [phone] here for surgical management of menorrhagia with irregular cycle, iron  deficiency due to chronic blood loss, and adenomyosis.   No significant preoperative concerns.  History of Present Illness: She has had chronic anemia "for as long as I can remember."  She is seeing hematology. She has had iron  infusions. Her husband has had a vasectomy and she has had three children and she is done.  Since her weight loss surgery, her periods are heavier and more painful.  Her periods are irregular and she has a lot of spotting between periods.  All the above is uncomfortable. Her cramping is so bad that she has trouble standing up straight. She uses super tampons, diaper-pads. She goes through these more than she believes she should. Her first two days of her  cycle she is changing a super tampon about every hour due to leaking through.  She has had blood counts as low as hgb of 9.2. She has had bariatric surgery. However, her iron  has been low throughout this time.     She had a pelvic ultrasound in 2019 that showed possible adenomyosis. There was an area on the posterior wall that was about 3.4 x 1.9 cm.   Pelvic ultrasound on 07/01/2023: Ultrasound demonstrates the following findings Adnexa: no masses seen  Uterus: anteverted with endometrial stripe  13.8 mm Additional: heterogeneous myometrium, suspicious for adenomyosis. No obvious polyps or fibroids noted.    She has had two IUDs in the past and didn't have a great experience either time.  One of the times, the IUD was expelled.  The second time she felt suicidal and right after it came out, she felt much better. She doesn't recall whether it changed her periods.   She doesn't feel like she would do a good job of remembering to take the pills (Lysteda).    Last pap smear: 06/10/2023: NILM, HPV negative Trichomonas present, treated.   Proposed surgery: Robot assisted  total laparoscopic hysterectomy, bilateral salpingectomy, cystoscopy   Past Medical History:  Diagnosis Date   Anemia 2023   Depression    postpartum   Endometriosis of uterus 2018   Fibroid 204   Herpes simplex virus (HSV) infection 2010   Hypertension    Migraine    Syncope and collapse 12/30/2020   Vitamin D deficiency 11/09/2016   Past Surgical History:  Procedure Laterality Date   Implantable Loop Monitor  05/05/2021   [employer]   EGD N/A 09/20/2021   Procedure: ESOPHAGOGASTRODUODENOSCOPY, FLEXIBLE, TRANSORAL; DIAGNOSTIC, INCLUDING COLLECTION OF SPECIMEN(S) BY BRUSHING OR WASHING, WHEN PERFORMED (SEPARATE PROCEDURE);  Surgeon: Jain-Spangler, Kunoor, MD;  Location: [facility] OR;  Service: General Surgery;  Laterality: N/A;   LAPAROSCOPIC GASTRIC BYPASS AND ROUX-EN-Y GASTROENTEROSTOMY N/A 12/28/2021   Procedure: LAPAROSCOPY, SURGICAL; GASTRIC RESTRICTIVE PROCEDURE; WITH GASTRIC BYPASS AND ROUX-EN-Y GASTROENTEROSTOMY (ROUX LIMB 150 CM OR LESS), tap block. egd;  Surgeon: Earlean Glaze, MD;  Location: [facility] OR;  Service: General Surgery;  Laterality: N/A;   EGD N/A 12/28/2021   Procedure: ESOPHAGOGASTRODUODENOSCOPY, FLEXIBLE, TRANSORAL; DIAGNOSTIC, INCLUDING COLLECTION OF SPECIMEN(S) BY BRUSHING OR WASHING, WHEN PERFORMED (SEPARATE PROCEDURE);  Surgeon: Jain-Spangler, Kunoor, MD;  Location: [facility] OR;  Service: General Surgery;  Laterality: N/A;   BARIATRIC SURGERY  2023   Rny bypass   CESAREAN SECTION  2021   DILATION AND CURETTAGE OF UTERUS  2013   DILATION AND CURETTAGE, DIAGNOSTIC / THERAPEUTIC     laparoscopy  PR CESAREAN DELIVERY ONLY     TOOTH EXTRACTION     OB History  Gravida Para Term Preterm AB Living  4 3 3   1 3   SAB IAB Ectopic Molar Multiple Live Births  1         3    # Outcome Date GA Lbr Len/2nd Weight Sex Type Anes PTL Lv  4 Term 12/30/19 [redacted]w[redacted]d  2.56 kg (5 lb 10.3 oz) M CS-LTranv Spinal  LIV  3 Term 12/08/12 [redacted]w[redacted]d  3.572 kg (7 lb 14 oz) M  Vag-Vacuum   LIV     Complications: Fetal Intolerance, Other  2 Term 11/02/06 [redacted]w[redacted]d  2.892 kg (6 lb 6 oz) M Vag-Spont   LIV  1 SAB           Patient denies any other pertinent gynecologic issues.   Current Outpatient Medications on File Prior to Visit  Medication Sig Dispense Refill   ferrous sulfate  325 (65 FE) MG EC tablet Take 325 mg by mouth daily with breakfast Iron  with vitamin c daily     multivitamin tablet Take 1 tablet by mouth once daily     ergocalciferol, vitamin D2, 1,250 mcg (50,000 unit) capsule Take 1 capsule (50,000 Units total) by mouth twice a week for 60 days Take 1 capsule twice a week for 8 wks, then repeat 25(OH) vitamin D level MAY RESTART 2 WEEKS AFTER SURGERY (Patient not taking: Reported on 01/16/2022) 16 capsule 0   No current facility-administered medications on file prior to visit.   Allergies  Allergen Reactions   Nsaids (Non-Steroidal Anti-Inflammatory Drug) Other (See Comments)    History of RYGB, rec avoiding NSAIDs d/t increased ulcer risk    Social History:   reports that she has never smoked. She has never used smokeless tobacco. She reports current alcohol use of about 2.0 standard drinks of alcohol per week. She reports that she does not use drugs.  Family History  Problem Relation Name Age of Onset   High blood pressure (Hypertension) Mother Maud Sorenson' disease Mother Yolanda hardge    Thyroid  disease Mother Yolanda hardge        Graves disease   High blood pressure (Hypertension) Father Lavonia Powers hardge    Coronary Artery Disease (Blocked arteries around heart) Father Mark hardge    Deep vein thrombosis (DVT or abnormal blood clot formation) Father Mark hardge    Myocardial Infarction (Heart attack) Father Mark hardge    Alzheimer's disease Paternal Grandmother Beulah hardge    Breast cancer Neg Hx     Colon cancer Neg Hx      Review of Systems: Noncontributory  PHYSICAL EXAM: Pulse 78, height 157.5 cm (5\' 2" ), weight 71.6 kg  (157 lb 12.8 oz), last menstrual period 09/01/2023. CONSTITUTIONAL: Well-developed, well-nourished female in no acute distress.  HENT:  Normocephalic, atraumatic, External right and left ear normal. Oropharynx is clear and moist EYES: Conjunctivae and EOM are normal. Pupils are equal, round, and reactive to light. No scleral icterus.  NECK: Normal range of motion, supple, no masses SKIN: Skin is warm and dry. No rash noted. Not diaphoretic. No erythema. No pallor. NEUROLGIC: Alert and oriented to person, place, and time. Normal reflexes, muscle tone coordination. No cranial nerve deficit noted. PSYCHIATRIC: Normal mood and affect. Normal behavior. Normal judgment and thought content. CARDIOVASCULAR: Normal heart rate noted, regular rhythm RESPIRATORY: Effort and breath sounds normal, no problems with respiration noted ABDOMEN: Soft, nontender, nondistended. PELVIC: Deferred except for  collection of vaginal swab for test of cure for trichomonas.  MUSCULOSKELETAL: Normal range of motion. No edema and no tenderness. 2+ distal pulses.  PE Chaperone note: A chaperone was included for sensitive portions of the exam- staff member name: Minetta Aly, CMA.  Chaperone present for pelvic exam.   Labs: No results found for this or any previous visit (from the past 2 weeks).  Imaging Studies: No results found.  Assessment: 1. Menorrhagia with irregular cycle   2. Iron  deficiency anemia due to chronic blood loss   3. Adenomyosis   4. Routine screening for STI (sexually transmitted infection)     Plan: Patient will undergo surgical management with the above-noted surgery.   The risks of surgery were discussed in detail with the patient including but not limited to: bleeding which may require transfusion or reoperation; infection which may require antibiotics; injury to surrounding organs which may involve bowel, bladder, ureters ; need for additional procedures including laparoscopy or laparotomy;  thromboembolic phenomenon, surgical site problems and other postoperative/anesthesia complications. Likelihood of success in alleviating the patient's condition was discussed. Routine postoperative instructions will be reviewed with the patient and her family in detail after surgery.  The patient concurred with the proposed plan, giving informed written consent for the surgery.   Preoperative prophylactic antibiotics, as indicated, and SCDs ordered on call to the OR.    Patient is going to ask if she can take Celebrex for a short term pain management strategy after surgery (perhaps with a PPI).  Otherwise, plan for Tylenol  and oxycodone  separately.    Return in 1 month (on 10/08/2023) for Post-op incision check, Keep previously scheduled appts.   Attestation Statement:   I personally performed the service. (TP)  Riki Gehring Teddy Fear, MD  [facility] 09/05/2023 4:11 PM

## 2023-09-19 ENCOUNTER — Encounter
Admission: RE | Admit: 2023-09-19 | Discharge: 2023-09-19 | Disposition: A | Discharge status: Home / Self Care | Source: Ambulatory Visit | Attending: Obstetrics and Gynecology | Admitting: Obstetrics and Gynecology

## 2023-09-19 ENCOUNTER — Other Ambulatory Visit: Payer: Self-pay

## 2023-09-19 VITALS — Ht 62.0 in | Wt 155.0 lb

## 2023-09-19 DIAGNOSIS — E876 Hypokalemia: Secondary | ICD-10-CM

## 2023-09-19 DIAGNOSIS — Z01818 Encounter for other preprocedural examination: Secondary | ICD-10-CM

## 2023-09-19 HISTORY — DX: Iron deficiency anemia, unspecified: D50.9

## 2023-09-19 NOTE — Patient Instructions (Signed)
 Your procedure is scheduled on: Friday 09/27/23 To find out your arrival time, please call [phone] between 1PM - 3PM on: Thursday 09/26/23   Report to the Registration Desk on the 1st floor of the [facility]. Free Valet parking is available.  If your arrival time is 6:00 am, do not arrive before that time as the [facility] entrance doors do not open until 6:00 am.  REMEMBER: Instructions that are not followed completely may result in serious medical risk, up to and including death; or upon the discretion of your surgeon and anesthesiologist your surgery may need to be rescheduled.  Do not eat food or drink any liquids after midnight the night before surgery.  No gum chewing or hard candies.  One week prior to surgery: Stop Anti-inflammatories (NSAIDS) such as Advil , Aleve, Ibuprofen , Motrin , Naproxen, Naprosyn and Aspirin based products such as Excedrin, Goody's Powder, BC Powder. You may however, continue to take Tylenol  if needed for pain up until the day of surgery.  Stop ANY OVER THE COUNTER supplements and vitamins until after surgery.  Continue taking all prescribed medications with the exception of the following: N/A  TAKE ONLY THESE MEDICATIONS THE MORNING OF SURGERY WITH A SIP OF WATER:  none  No Alcohol for 24 hours before or after surgery.  No Smoking including e-cigarettes for 24 hours before surgery.  No chewable tobacco products for at least 6 hours before surgery.  No nicotine patches on the day of surgery.  Do not use any recreational drugs for at least a week (preferably 2 weeks) before your surgery.  Please be advised that the combination of cocaine and anesthesia may have negative outcomes, up to and including death. If you test positive for cocaine, your surgery will be cancelled.  On the morning of surgery brush your teeth with toothpaste and water, you may rinse your mouth with mouthwash if you wish. Do not swallow any toothpaste or  mouthwash.  Use CHG Soap or wipes as directed on instruction sheet.  Do not wear lotions, powders, or perfumes.   Do not shave body hair from the neck down 48 hours before surgery.  Wear comfortable clothing (specific to your surgery type) to the hospital.  Do not wear jewelry, make-up, hairpins, clips or nail polish.  For welded (permanent) jewelry: bracelets, anklets, waist bands, etc.  Please have this removed prior to surgery.  If it is not removed, there is a chance that hospital personnel will need to cut it off on the day of surgery. Contact lenses, hearing aids and dentures may not be worn into surgery.  Do not bring valuables to the hospital. [facility] is not responsible for any missing/lost belongings or valuables.   Notify your doctor if there is any change in your medical condition (cold, fever, infection).  If you are being discharged the day of surgery, you will not be allowed to drive home. You will need a responsible individual to drive you home and stay with you for 24 hours after surgery.   After surgery, you can help prevent lung complications by doing breathing exercises.  Take deep breaths and cough every 1-2 hours. Your doctor may order a device called an Incentive Spirometer to help you take deep breaths. When coughing or sneezing, hold a pillow firmly against your incision with both hands. This is called "splinting." Doing this helps protect your incision. It also decreases belly discomfort.  Surgery Visitation Policy:  Patients undergoing a surgery or procedure may have two  family members or support persons with them as long as the person is not COVID-19 positive or experiencing its symptoms.   Please call the Pre-admissions Testing Dept. at [phone] if you have any questions about these instructions.     Preparing for Surgery with CHLORHEXIDINE  GLUCONATE (CHG) Soap  Chlorhexidine  Gluconate (CHG) Soap  o An antiseptic cleaner that kills germs  and bonds with the skin to continue killing germs even after washing  o Used for showering the night before surgery and morning of surgery  Before surgery, you can play an important role by reducing the number of germs on your skin.  CHG (Chlorhexidine  gluconate) soap is an antiseptic cleanser which kills germs and bonds with the skin to continue killing germs even after washing.  Please do not use if you have an allergy to CHG or antibacterial soaps. If your skin becomes reddened/irritated stop using the CHG.  1. Shower the NIGHT BEFORE SURGERY and the MORNING OF SURGERY with CHG soap.  2. If you choose to wash your hair, wash your hair first as usual with your normal shampoo.  3. After shampooing, rinse your hair and body thoroughly to remove the shampoo.  4. Use CHG as you would any other liquid soap. You can apply CHG directly to the skin and wash gently with a scrungie or a clean washcloth.  5. Apply the CHG soap to your body only from the neck down. Do not use on open wounds or open sores. Avoid contact with your eyes, ears, mouth, and genitals (private parts). Wash face and genitals (private parts) with your normal soap.  6. Wash thoroughly, paying special attention to the area where your surgery will be performed.  7. Thoroughly rinse your body with warm water.  8. Do not shower/wash with your normal soap after using and rinsing off the CHG soap.  9. Pat yourself dry with a clean towel.  10. Wear clean pajamas to bed the night before surgery.  12. Place clean sheets on your bed the night of your first shower and do not sleep with pets.  13. Shower again with the CHG soap on the day of surgery prior to arriving at the hospital.  14. Do not apply any deodorants/lotions/powders.  15. Please wear clean clothes to the hospital.

## 2023-09-23 ENCOUNTER — Encounter
Admission: RE | Admit: 2023-09-23 | Discharge: 2023-09-23 | Disposition: A | Discharge status: Home / Self Care | Source: Ambulatory Visit | Attending: Obstetrics and Gynecology | Admitting: Obstetrics and Gynecology

## 2023-09-23 DIAGNOSIS — D500 Iron deficiency anemia secondary to blood loss (chronic): Secondary | ICD-10-CM | POA: Insufficient documentation

## 2023-09-23 DIAGNOSIS — Z01818 Encounter for other preprocedural examination: Secondary | ICD-10-CM | POA: Insufficient documentation

## 2023-09-23 DIAGNOSIS — N921 Excessive and frequent menstruation with irregular cycle: Secondary | ICD-10-CM | POA: Insufficient documentation

## 2023-09-23 DIAGNOSIS — N8003 Adenomyosis of the uterus: Secondary | ICD-10-CM | POA: Diagnosis not present

## 2023-09-23 DIAGNOSIS — E876 Hypokalemia: Secondary | ICD-10-CM | POA: Diagnosis not present

## 2023-09-23 LAB — CBC
HCT: 38.8 % (ref 36.0–46.0)
Hemoglobin: 12.5 g/dL (ref 12.0–15.0)
MCH: 26.3 pg (ref 26.0–34.0)
MCHC: 32.2 g/dL (ref 30.0–36.0)
MCV: 81.5 fL (ref 80.0–100.0)
Platelets: 357 10*3/uL (ref 150–400)
RBC: 4.76 MIL/uL (ref 3.87–5.11)
RDW: 14.1 % (ref 11.5–15.5)
WBC: 5.5 10*3/uL (ref 4.0–10.5)
nRBC: 0 % (ref 0.0–0.2)

## 2023-09-23 LAB — BASIC METABOLIC PANEL WITH GFR
Anion gap: 6 (ref 5–15)
BUN: 8 mg/dL (ref 6–20)
CO2: 26 mmol/L (ref 22–32)
Calcium: 8.5 mg/dL — ABNORMAL LOW (ref 8.9–10.3)
Chloride: 107 mmol/L (ref 98–111)
Creatinine, Ser: 0.49 mg/dL (ref 0.44–1.00)
GFR, Estimated: >60 mL/min (ref >60)
Glucose, Bld: 95 mg/dL (ref 70–99)
Potassium: 3.2 mmol/L — ABNORMAL LOW (ref 3.5–5.1)
Sodium: 139 mmol/L (ref 135–145)

## 2023-09-23 LAB — TYPE AND SCREEN
ABO/RH(D): A NEG
Antibody Screen: NEGATIVE

## 2023-09-26 MED ORDER — CHLORHEXIDINE GLUCONATE 0.12 % MT SOLN
15.0000 mL | Freq: Once | OROMUCOSAL | Status: AC
  Administered 2023-09-27: 15 mL via OROMUCOSAL

## 2023-09-26 MED ORDER — SODIUM CHLORIDE 0.9 % IV SOLN: INTRAVENOUS | Status: AC

## 2023-09-26 MED ORDER — POVIDONE-IODINE 10 % EX SWAB
2.0000 | Freq: Once | CUTANEOUS | Status: AC
  Administered 2023-09-27: 2 via TOPICAL

## 2023-09-26 MED ORDER — CEFAZOLIN SODIUM-DEXTROSE 2-4 GM/100ML-% IV SOLN
2.0000 g | INTRAVENOUS | Status: AC
  Administered 2023-09-27: 2 g via INTRAVENOUS

## 2023-09-26 MED ORDER — ACETAMINOPHEN 500 MG PO TABS
1000.0000 mg | ORAL_TABLET | ORAL | Status: AC
  Administered 2023-09-27: 1000 mg via ORAL

## 2023-09-26 MED ORDER — ORAL CARE MOUTH RINSE: 15.0000 mL | Freq: Once | OROMUCOSAL | Status: AC

## 2023-09-26 MED ORDER — SOD CITRATE-CITRIC ACID 500-334 MG/5ML PO SOLN
30.0000 mL | ORAL | Status: AC
Start: 2023-09-27 — End: 2023-09-28
  Administered 2023-09-27: 30 mL via ORAL
  Filled 2023-09-26: qty 30

## 2023-09-27 ENCOUNTER — Other Ambulatory Visit: Payer: Self-pay

## 2023-09-27 ENCOUNTER — Ambulatory Visit: Payer: Self-pay | Admitting: Urgent Care

## 2023-09-27 ENCOUNTER — Encounter: Payer: Self-pay | Admitting: Hematology and Oncology

## 2023-09-27 ENCOUNTER — Encounter
Admission: RE | Disposition: A | Discharge status: Home / Self Care | Payer: Self-pay | Source: Ambulatory Visit | Attending: Obstetrics and Gynecology

## 2023-09-27 ENCOUNTER — Encounter: Payer: Self-pay | Admitting: Obstetrics and Gynecology

## 2023-09-27 ENCOUNTER — Ambulatory Visit
Admission: RE | Admit: 2023-09-27 | Discharge: 2023-09-27 | Disposition: A | Discharge status: Home / Self Care | Source: Ambulatory Visit | Attending: Obstetrics and Gynecology | Admitting: Obstetrics and Gynecology

## 2023-09-27 DIAGNOSIS — I10 Essential (primary) hypertension: Secondary | ICD-10-CM | POA: Insufficient documentation

## 2023-09-27 DIAGNOSIS — N8003 Adenomyosis of the uterus: Secondary | ICD-10-CM | POA: Diagnosis present

## 2023-09-27 DIAGNOSIS — D251 Intramural leiomyoma of uterus: Secondary | ICD-10-CM | POA: Insufficient documentation

## 2023-09-27 DIAGNOSIS — Z01818 Encounter for other preprocedural examination: Secondary | ICD-10-CM

## 2023-09-27 DIAGNOSIS — N72 Inflammatory disease of cervix uteri: Secondary | ICD-10-CM | POA: Diagnosis not present

## 2023-09-27 DIAGNOSIS — N83299 Other ovarian cyst, unspecified side: Secondary | ICD-10-CM | POA: Insufficient documentation

## 2023-09-27 DIAGNOSIS — D509 Iron deficiency anemia, unspecified: Secondary | ICD-10-CM | POA: Diagnosis present

## 2023-09-27 DIAGNOSIS — N921 Excessive and frequent menstruation with irregular cycle: Secondary | ICD-10-CM | POA: Diagnosis present

## 2023-09-27 DIAGNOSIS — D500 Iron deficiency anemia secondary to blood loss (chronic): Secondary | ICD-10-CM | POA: Diagnosis present

## 2023-09-27 HISTORY — PX: CYSTOSCOPY: SHX5120

## 2023-09-27 HISTORY — PX: HYSTERECTOMY, TOTAL, LAPAROSCOPIC, ROBOT-ASSISTED WITH SALPINGECTOMY: SHX7587

## 2023-09-27 LAB — POCT PREGNANCY, URINE: Preg Test, Ur: NEGATIVE

## 2023-09-27 SURGERY — HYSTERECTOMY, TOTAL, LAPAROSCOPIC, ROBOT-ASSISTED WITH SALPINGECTOMY
Anesthesia: General | Site: Uterus

## 2023-09-27 MED ORDER — ONDANSETRON HCL 4 MG/2ML IJ SOLN
INTRAMUSCULAR | Status: DC | PRN
  Administered 2023-09-27: 4 mg via INTRAVENOUS

## 2023-09-27 MED ORDER — LIDOCAINE HCL (CARDIAC) PF 100 MG/5ML IV SOSY
PREFILLED_SYRINGE | INTRAVENOUS | Status: DC | PRN
  Administered 2023-09-27: 100 mg via INTRAVENOUS

## 2023-09-27 MED ORDER — OXYCODONE HCL 5 MG PO TABS
5.0000 mg | ORAL_TABLET | Freq: Once | ORAL | Status: AC | PRN
  Administered 2023-09-27: 5 mg via ORAL

## 2023-09-27 MED ORDER — ONDANSETRON 4 MG PO TBDP: 4.0000 mg | ORAL_TABLET | Freq: Four times a day (QID) | ORAL | 0 refills | Status: AC | PRN

## 2023-09-27 MED ORDER — KETAMINE HCL 50 MG/5ML IJ SOSY
PREFILLED_SYRINGE | INTRAMUSCULAR | Status: DC | PRN
  Administered 2023-09-27: 10 mg via INTRAVENOUS
  Administered 2023-09-27: 30 mg via INTRAVENOUS
  Administered 2023-09-27: 10 mg via INTRAVENOUS

## 2023-09-27 MED ORDER — HYDROMORPHONE HCL 1 MG/ML IJ SOLN: 0.2500 mg | INTRAMUSCULAR | Status: DC | PRN

## 2023-09-27 MED ORDER — MIDAZOLAM HCL 5 MG/5ML IJ SOLN
INTRAMUSCULAR | Status: DC | PRN
  Administered 2023-09-27: 2 mg via INTRAVENOUS

## 2023-09-27 MED ORDER — LACTATED RINGERS IV SOLN: INTRAVENOUS | Status: DC

## 2023-09-27 MED ORDER — CHLORHEXIDINE GLUCONATE 0.12 % MT SOLN
OROMUCOSAL | Status: AC
  Filled 2023-09-27: qty 15

## 2023-09-27 MED ORDER — 0.9 % SODIUM CHLORIDE (POUR BTL) OPTIME
TOPICAL | Status: DC | PRN
  Administered 2023-09-27: 500 mL

## 2023-09-27 MED ORDER — OXYCODONE HCL 5 MG/5ML PO SOLN: 5.0000 mg | Freq: Once | ORAL | Status: AC | PRN

## 2023-09-27 MED ORDER — KETOROLAC TROMETHAMINE 30 MG/ML IJ SOLN
INTRAMUSCULAR | Status: DC | PRN
  Administered 2023-09-27: 30 mg via INTRAVENOUS

## 2023-09-27 MED ORDER — OXYCODONE HCL 5 MG PO TABS: 5.0000 mg | ORAL_TABLET | Freq: Four times a day (QID) | ORAL | 0 refills | Status: AC | PRN

## 2023-09-27 MED ORDER — PROPOFOL 10 MG/ML IV BOLUS
INTRAVENOUS | Status: AC
  Filled 2023-09-27: qty 20

## 2023-09-27 MED ORDER — KETAMINE HCL 50 MG/5ML IJ SOSY
PREFILLED_SYRINGE | INTRAMUSCULAR | Status: AC
  Filled 2023-09-27: qty 5

## 2023-09-27 MED ORDER — BUPIVACAINE HCL (PF) 0.5 % IJ SOLN
INTRAMUSCULAR | Status: AC
  Filled 2023-09-27: qty 30

## 2023-09-27 MED ORDER — BUPIVACAINE HCL 0.5 % IJ SOLN
INTRAMUSCULAR | Status: DC | PRN
  Administered 2023-09-27: 10 mL

## 2023-09-27 MED ORDER — FENTANYL CITRATE (PF) 100 MCG/2ML IJ SOLN
INTRAMUSCULAR | Status: DC | PRN
  Administered 2023-09-27: 100 ug via INTRAVENOUS

## 2023-09-27 MED ORDER — SUGAMMADEX SODIUM 200 MG/2ML IV SOLN
INTRAVENOUS | Status: DC | PRN
  Administered 2023-09-27: 200 mg via INTRAVENOUS

## 2023-09-27 MED ORDER — MIDAZOLAM HCL 2 MG/2ML IJ SOLN
INTRAMUSCULAR | Status: AC
  Filled 2023-09-27: qty 2

## 2023-09-27 MED ORDER — ROCURONIUM BROMIDE 100 MG/10ML IV SOLN
INTRAVENOUS | Status: DC | PRN
  Administered 2023-09-27: 10 mg via INTRAVENOUS
  Administered 2023-09-27: 50 mg via INTRAVENOUS
  Administered 2023-09-27: 10 mg via INTRAVENOUS
  Administered 2023-09-27: 20 mg via INTRAVENOUS

## 2023-09-27 MED ORDER — CEFAZOLIN SODIUM-DEXTROSE 2-4 GM/100ML-% IV SOLN
INTRAVENOUS | Status: AC
  Filled 2023-09-27: qty 100

## 2023-09-27 MED ORDER — OXYCODONE HCL 5 MG PO TABS: 5.0000 mg | ORAL_TABLET | Freq: Four times a day (QID) | ORAL | 0 refills | Status: DC | PRN

## 2023-09-27 MED ORDER — METHYLENE BLUE (ANTIDOTE) 1 % IV SOLN
INTRAVENOUS | Status: AC
  Filled 2023-09-27: qty 10

## 2023-09-27 MED ORDER — METHYLENE BLUE (ANTIDOTE) 1 % IV SOLN
INTRAVENOUS | Status: DC | PRN
  Administered 2023-09-27: 2 mL via SUBMUCOSAL

## 2023-09-27 MED ORDER — PHENYLEPHRINE 80 MCG/ML (10ML) SYRINGE FOR IV PUSH (FOR BLOOD PRESSURE SUPPORT)
PREFILLED_SYRINGE | INTRAVENOUS | Status: DC | PRN
Start: 2023-09-27 — End: 2023-09-27
  Administered 2023-09-27: 80 ug via INTRAVENOUS

## 2023-09-27 MED ORDER — ACETAMINOPHEN 500 MG PO TABS
ORAL_TABLET | ORAL | Status: AC
Start: 2023-09-27 — End: 2023-09-27
  Filled 2023-09-27: qty 2

## 2023-09-27 MED ORDER — FENTANYL CITRATE (PF) 100 MCG/2ML IJ SOLN
INTRAMUSCULAR | Status: AC
  Filled 2023-09-27: qty 2

## 2023-09-27 MED ORDER — OXYCODONE HCL 5 MG PO TABS
ORAL_TABLET | ORAL | Status: AC
  Filled 2023-09-27: qty 1

## 2023-09-27 MED ORDER — SODIUM CHLORIDE 0.9 % IR SOLN
Status: DC | PRN
  Administered 2023-09-27: 500 mL

## 2023-09-27 MED ORDER — DEXMEDETOMIDINE HCL IN NACL 80 MCG/20ML IV SOLN
INTRAVENOUS | Status: DC | PRN
  Administered 2023-09-27 (×2): 4 ug via INTRAVENOUS
  Administered 2023-09-27: 8 ug via INTRAVENOUS
  Administered 2023-09-27: 4 ug via INTRAVENOUS

## 2023-09-27 MED ORDER — DEXAMETHASONE SODIUM PHOSPHATE 10 MG/ML IJ SOLN
INTRAMUSCULAR | Status: DC | PRN
  Administered 2023-09-27: 10 mg via INTRAVENOUS

## 2023-09-27 MED ORDER — PROPOFOL 10 MG/ML IV BOLUS
INTRAVENOUS | Status: DC | PRN
  Administered 2023-09-27: 150 mg via INTRAVENOUS

## 2023-09-27 SURGICAL SUPPLY — 51 items
BAG URINE DRAIN 2000ML AR STRL (UROLOGICAL SUPPLIES) ×2 IMPLANT
BLADE SURG 15 STRL LF DISP TIS (BLADE) ×2 IMPLANT
BLADE SURG SZ11 CARB STEEL (BLADE) ×2 IMPLANT
CANNULA CAP OBTURATR AIRSEAL 8 (CAP) ×2 IMPLANT
CATH URTH 16FR FL 2W BLN LF (CATHETERS) ×2 IMPLANT
COVER TIP SHEARS 8 DVNC (MISCELLANEOUS) ×2 IMPLANT
DERMABOND ADVANCED .7 DNX12 (GAUZE/BANDAGES/DRESSINGS) ×2 IMPLANT
DRAPE ARM DVNC X/XI (DISPOSABLE) ×8 IMPLANT
DRAPE COLUMN DVNC XI (DISPOSABLE) ×2 IMPLANT
DRIVER NDL MEGA SUTCUT DVNCXI (INSTRUMENTS) ×2 IMPLANT
DRIVER NDLE MEGA SUTCUT DVNCXI (INSTRUMENTS) ×2 IMPLANT
ELECTRODE REM PT RTRN 9FT ADLT (ELECTROSURGICAL) ×2 IMPLANT
FORCEPS BPLR FENES DVNC XI (FORCEP) ×2 IMPLANT
FORCEPS BPLR R/ABLATION 8 DVNC (INSTRUMENTS) ×2 IMPLANT
GAUZE 4X4 16PLY ~~LOC~~+RFID DBL (SPONGE) ×2 IMPLANT
GLOVE BIO SURGEON STRL SZ7 (GLOVE) ×6 IMPLANT
GLOVE BIOGEL PI IND STRL 7.5 (GLOVE) ×6 IMPLANT
GOWN STRL REUS W/ TWL LRG LVL3 (GOWN DISPOSABLE) ×6 IMPLANT
IRRIGATION STRYKERFLOW (MISCELLANEOUS) IMPLANT
IRRIGATOR SUCT 8 DISP DVNC XI (IRRIGATION / IRRIGATOR) IMPLANT
IV NS 1000ML BAXH (IV SOLUTION) ×2 IMPLANT
KIT PINK PAD W/HEAD ARM REST (MISCELLANEOUS) ×2 IMPLANT
KIT TURNOVER CYSTO (KITS) ×2 IMPLANT
LABEL OR SOLS (LABEL) ×2 IMPLANT
MANIFOLD NEPTUNE II (INSTRUMENTS) ×2 IMPLANT
MANIPULATOR VCARE LG CRV RETR (MISCELLANEOUS) IMPLANT
MANIPULATOR VCARE SML CRV RETR (MISCELLANEOUS) IMPLANT
MANIPULATOR VCARE STD CRV RETR (MISCELLANEOUS) IMPLANT
NDL HYPO 22X1.5 SAFETY MO (MISCELLANEOUS) ×2 IMPLANT
NEEDLE HYPO 22X1.5 SAFETY MO (MISCELLANEOUS) ×2 IMPLANT
NS IRRIG 500ML POUR BTL (IV SOLUTION) ×2 IMPLANT
OBTURATOR OPTICALSTD 8 DVNC (TROCAR) ×2 IMPLANT
OCCLUDER COLPOPNEUMO (BALLOONS) ×2 IMPLANT
PACK GYN LAPAROSCOPIC (MISCELLANEOUS) ×2 IMPLANT
PAD PREP OB/GYN DISP 24X41 (PERSONAL CARE ITEMS) ×2 IMPLANT
SCISSORS MNPLR CVD DVNC XI (INSTRUMENTS) ×2 IMPLANT
SCRUB CHG 4% DYNA-HEX 4OZ (MISCELLANEOUS) ×2 IMPLANT
SEAL UNIV 5-12 XI (MISCELLANEOUS) ×4 IMPLANT
SEALER VESSEL EXT DVNC XI (MISCELLANEOUS) IMPLANT
SET CYSTO W/LG BORE CLAMP LF (SET/KITS/TRAYS/PACK) IMPLANT
SET TUBE FILTERED XL AIRSEAL (SET/KITS/TRAYS/PACK) ×2 IMPLANT
SOLUTION ELECTROSURG ANTI STCK (MISCELLANEOUS) ×2 IMPLANT
SOLUTION PREP PVP 2OZ (MISCELLANEOUS) ×2 IMPLANT
SPONGE T-LAP 18X18 ~~LOC~~+RFID (SPONGE) IMPLANT
SURGILUBE 2OZ TUBE FLIPTOP (MISCELLANEOUS) ×2 IMPLANT
SUT STRATA PDS 0 30 CT-2.5 (SUTURE) ×2 IMPLANT
SUT STRATAFIX SPIRAL PDS+ 0 30 (SUTURE) IMPLANT
SUTURE MNCRL 4-0 27XMF (SUTURE) ×2 IMPLANT
SYR 10ML LL (SYRINGE) ×2 IMPLANT
SYR 50ML LL SCALE MARK (SYRINGE) ×2 IMPLANT
WATER STERILE IRR 500ML POUR (IV SOLUTION) ×2 IMPLANT

## 2023-09-27 NOTE — Op Note (Signed)
 Operative Note    Name: Jenna Sims  Date of Service: 09/27/2023   DOB: 12/15/1985  MRN: [id number]    Pre-Operative Diagnosis:  1) Menorrhagia with irregular cycle 2) Adenomyosis 3) Iron  deficiency anemia as a result of chronic blood loss  Post-Operative Diagnosis:  1) Menorrhagia with irregular cycle 2) Adenomyosis 3) Iron  deficiency anemia as a result of chronic blood loss  Procedures:  1) Robot assisted Total Laparoscopic Hysterectomy, bilateral salpingectomy  2) Cystoscopy  Primary Surgeon: Jeani Mill, MD  Assistant Surgeon: Prescilla Brod, MD   EBL: 50 mL   IVF: 800 mL   Urine output: 400 mL  Specimens: Uterus, cervix, and bilateral fallopian tubes  Drains: none  Complications: None   Disposition: PACU   Condition: Stable   Findings:  1) enlarged uterus with posterior thickening consistent with either fibroid or adenomyosis 2) Adhesions of bladder to anterior lower uterine segment 3) normal appearing ovaries, bilateral fallopian tubes 4) On cystoscopy, no defects in bladder wall and normal efflux of urine from the bilateral ureteral orifices  Procedure Summary:  The patient was taken to the operating room where general anesthesia was administered and found to be adequate. She was placed in the dorsal supine lithotomy position in [place] stirrups and prepped and draped in the usual, sterile fashion. After a timeout was called an indwelling catheter was placed in her bladder.  A sterile speculum was placed in her vagina.  The anterior lip of the cervix was grasped with the single-tooth tenaculum.  The cervix was serially dilated to an 11 Pratt dilator.  The large Vcare device was placed in accordance to the manufacturer's recommendations.  The tenaculum and speculum were removed.   Attention was turned to the abdomen where after injection of local anesthetic, an 8 mm infraumbilical incision was made with the scalpel. Entry into the abdomen was obtained  via Optiview trocar technique (a blunt entry technique with camera visualization through the obturator upon entry). Verification of entry into the abdomen was obtained using opening pressures. The abdomen was insufflated with CO2. The camera was introduced through the trocar with verification of atraumatic entry.  Right and left abdominal entry sites were created after injection of local anesthetic about 8 cm lateral to the umbilical port in accordance with the Intuitive manufacturer's recommendations.  The port sites were 8 mm.  The intuitive trochars were introduced under intra-abdominal camera visualization without difficulty   The XI robot was docked on the patient's left.  Clearance was verified from the patient's legs.  Through the umbilical port the camera was placed.  Through the port attached to arm 4 the monopolar scissors were placed.  Through the port attached to arm 2 the forced bipolar forceps were was placed.     An inspection was undertaken of the pelvis with the above-noted findings. The bilateral ureters were identified and found to be well away from the operative area of interest. The right fallopian tube was grasped at the fimbriated end and was transected along the mesosalpinx in a lateral-to-medial fashion.  The round ligament was cauterized and transected on the right. The broad ligament was opened posteriorly and anteriorly.  The retroperitoneal space was divided until the ureter was identified and found to have a normal course.  The utero-ovarian ligament was transected. Tissue was divided along the right broad ligament to the level of the internal cervical os.  Bladder tissue was dissected off the lower uterine segment and cervix without difficulty. The right uterine artery was  skeletonized and identified and after ligation was transected. The same procedure was carried out on the left side. The colpotomy was performed using monopolar electrocautery in a circumferential fashion  following the KOH ring.  The uterus and fallopian tubes and cervix were removed through the vagina.   Closure of the vaginal cuff was undertaken using the 0 Stratafix suture in a running fashion.  All vascular pedicles were inspected and found to be hemostatic.  The CO2 pressure was dropped to 5 mmHg to ensure ongoing hemostasis.  Irrigation and suction were performed.  All instruments removed from the robotic ports.  The robot was undocked from the patient.  The abdomen was then desufflated of CO2 with the aid of 5 deep breaths from anesthesia.  All trochars were then removed.  All skin incisions were closed using 4-0 Vicryl in a subcuticular fashion and reinforced using surgical skin glue.   Cystoscopy was undertaken at this point. The Foley catheter was removed and the 70 cystoscope was gently introduced through the urethra. The bladder survey was undertaken with efflux of urine from both orifices noted. There were no defects noted in the bladder wall. The cystoscope was utilized to nearly empty the bladder.  A digital sweep of the vagina was undertaken to ensure no instruments or sponges remained and this was verified to be clear. A sterile speculum was placed in the vagina and the vaginal cuff was found to be intact and no vaginal tears or abrasions noted.    The assistant surgeon placed the uterine manipulator, performed uterine manipulation, performed cystoscopy, and the vaginal inspection at the end of the case.   The patient tolerated the procedure well.  Sponge, lap, needle, and instrument counts were correct x 2.  VTE prophylaxis: SCDs. Antibiotic prophylaxis: Ancef  2 grams IV prior to skin incision. The assistant was an MD due to the lack of availability of another highly Sales promotion account executive.  She was awakened in the operating room and was taken to the PACU in stable condition.   Jeani Mill, MD, [facility] 09/27/2023 10:49 AM

## 2023-09-27 NOTE — Interval H&P Note (Signed)
 History and Physical Interval Note:  09/27/2023 7:25 AM  Jenna Sims  has presented today for surgery, with the diagnosis of menorrhagia with irregular cycle adenomyosis anemia.  The various methods of treatment have been discussed with the patient and family. After consideration of risks, benefits and other options for treatment, the patient has consented to  Procedure(s): HYSTERECTOMY, TOTAL, LAPAROSCOPIC, ROBOT-ASSISTED WITH SALPINGECTOMY (Bilateral) CYSTOSCOPY (N/A) as a surgical intervention.  The patient's history has been reviewed, patient examined, no change in status, stable for surgery.  I have reviewed the patient's chart and labs.  Questions were answered to the patient's satisfaction.    Jeani Mill, MD, [facility] 09/27/2023 7:25 AM

## 2023-09-27 NOTE — Anesthesia Postprocedure Evaluation (Signed)
 Anesthesia Post Note  Patient: Jenna Sims  Procedure(s) Performed: HYSTERECTOMY, TOTAL, LAPAROSCOPIC, ROBOT-ASSISTED WITH SALPINGECTOMY (Bilateral: Uterus) CYSTOSCOPY (Bladder)  Patient location during evaluation: PACU Anesthesia Type: General Level of consciousness: awake and alert Pain management: pain level controlled Vital Signs Assessment: post-procedure vital signs reviewed and stable Respiratory status: spontaneous breathing, nonlabored ventilation, respiratory function stable and patient connected to nasal cannula oxygen Cardiovascular status: blood pressure returned to baseline and stable Postop Assessment: no apparent nausea or vomiting Anesthetic complications: no   No notable events documented.   Last Vitals:  Vitals:   09/27/23 1215 09/27/23 1243  BP: (!) 144/98 (!) 148/90  Pulse: 84 82  Resp: 17 14  Temp: 36.6 C (!) 36.3 C  SpO2: 100% 100%    Last Pain:  Vitals:   09/27/23 1243  TempSrc:   PainSc: 5                  Lattie Poli

## 2023-09-27 NOTE — Anesthesia Procedure Notes (Signed)
 Procedure Name: Intubation Date/Time: 09/27/2023 7:59 AM  Performed by: Donnamae Gaba, CRNAPre-anesthesia Checklist: Patient identified, Emergency Drugs available, Suction available and Patient being monitored Patient Re-evaluated:Patient Re-evaluated prior to induction Oxygen Delivery Method: Circle system utilized Preoxygenation: Pre-oxygenation with 100% oxygen Induction Type: IV induction Ventilation: Mask ventilation without difficulty Laryngoscope Size: McGrath and 3 Grade View: Grade I Tube type: Oral Tube size: 7.0 mm Number of attempts: 1 Airway Equipment and Method: Stylet Placement Confirmation: ETT inserted through vocal cords under direct vision, positive ETCO2 and breath sounds checked- equal and bilateral Secured at: 21 cm Tube secured with: Tape Dental Injury: Teeth and Oropharynx as per pre-operative assessment

## 2023-09-27 NOTE — Transfer of Care (Signed)
 Immediate Anesthesia Transfer of Care Note  Patient: Jenna Sims  Procedure(s) Performed: HYSTERECTOMY, TOTAL, LAPAROSCOPIC, ROBOT-ASSISTED WITH SALPINGECTOMY (Bilateral: Uterus) CYSTOSCOPY (Bladder)  Patient Location: PACU  Anesthesia Type:General  Level of Consciousness: drowsy  Airway & Oxygen Therapy: Patient Spontanous Breathing  Post-op Assessment: Report given to RN and Post -op Vital signs reviewed and stable  Post vital signs: Reviewed and stable  Last Vitals:  Vitals Value Taken Time  BP 146/89 09/27/23 10:56  Temp    Pulse 73 09/27/23 10:58  Resp 16 09/27/23 10:58  SpO2 100 % 09/27/23 10:58  Vitals shown include unfiled device data.  Last Pain:  Vitals:   09/27/23 0621  TempSrc: Temporal  PainSc: 0-No pain         Complications: No notable events documented.

## 2023-09-27 NOTE — Anesthesia Preprocedure Evaluation (Addendum)
 Anesthesia Evaluation  Patient identified by MRN, date of birth, ID band Patient awake    Reviewed: Allergy & Precautions, NPO status , Patient's Chart, lab work & pertinent test results  History of Anesthesia Complications Negative for: history of anesthetic complications  Airway Mallampati: I  TM Distance: >3 FB Neck ROM: full    Dental no notable dental hx.    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 No date: Hypertension No date: Iron  deficiency anemia No date: Lower back pain  Past Surgical History: 12/30/2019: CESAREAN SECTION     Comment:  Procedure: CESAREAN SECTION;  Surgeon: Kris Pester, MD;  Location: [facility] ORS;  Service: Obstetrics;; No date: DILATION AND CURETTAGE OF UTERUS 11/18/2018: LAPAROSCOPY; N/A     Comment:  Procedure: LAPAROSCOPY DIAGNOSTIC;  Surgeon: Kris Pester, MD;  Location: [facility] ORS;  Service: Gynecology;              Laterality: N/A; No date: ROUX-EN-Y GASTRIC BYPASS No date: WISDOM TOOTH EXTRACTION  BMI    Body Mass Index: 28.35 kg/m      Reproductive/Obstetrics negative OB ROS                             Anesthesia Physical Anesthesia Plan  ASA: 2  Anesthesia Plan: General ETT   Post-op Pain Management: Toradol  IV (intra-op)*, Ofirmev  IV (intra-op)*, Dilaudid IV, Precedex and Ketamine IV*   Induction:  Intravenous  PONV Risk Score and Plan: 3 and Ondansetron , Dexamethasone , Midazolam  and Treatment may vary due to age or medical condition  Airway Management Planned: Oral ETT  Additional Equipment:   Intra-op Plan:   Post-operative Plan: Extubation in OR  Informed Consent: I have reviewed the patients History and Physical, chart, labs and discussed the procedure including the risks, benefits and alternatives for the proposed anesthesia with the patient or authorized representative who has indicated his/her understanding and acceptance.     Dental Advisory Given  Plan Discussed with: Anesthesiologist, CRNA and Surgeon  Anesthesia Plan Comments: (Patient consented for risks of anesthesia including but not limited to:  - adverse reactions to medications - damage to eyes, teeth, lips or other oral mucosa - nerve damage due to positioning  - sore throat or hoarseness - Damage to heart, brain, nerves, lungs, other parts of body or loss of life  Patient voiced understanding and assent.)       Anesthesia Quick Evaluation

## 2023-09-28 ENCOUNTER — Encounter: Payer: Self-pay | Admitting: Obstetrics and Gynecology

## 2023-09-30 LAB — SURGICAL PATHOLOGY

## 2024-01-19 ENCOUNTER — Encounter: Payer: Self-pay | Admitting: Hematology and Oncology

## 2024-02-28 ENCOUNTER — Ambulatory Visit
Admission: RE | Admit: 2024-02-28 | Discharge: 2024-02-28 | Disposition: A | Discharge status: Home / Self Care | Source: Ambulatory Visit | Attending: Physician Assistant | Admitting: Physician Assistant

## 2024-02-28 ENCOUNTER — Other Ambulatory Visit: Payer: Self-pay | Admitting: Physician Assistant

## 2024-02-28 DIAGNOSIS — M79605 Pain in left leg: Secondary | ICD-10-CM

## 2024-02-28 NOTE — Telephone Encounter (Signed)
 No Actions Required Take Action Suggestions: Disposition:Office Now Patient Agreed to Disposition       Reason for call: Patient is calling for:  Chief Complaint  Patient presents with  . Leg Pain  . Mass     Spot/limp on left leg, near ankle  Sore to touch , warm  to touch, raised and swollen  Yesterday it was sore but worsened overnight  No recent fall, injuries or bites No fever or chills  Pt flew over the weekend (approx 3 hr flight )    Actions taken during call: Scheduled an Appointment.  Future Appointments     Date/Time Provider Department Center Visit Type   02/28/2024 11:20 AM (Arrive by 11:05 AM) Gretta Ozell Ruth, PA [facility] SAME DAY   03/19/2024 2:00 PM (Arrive by 1:45 PM) Yoo, Jin Soo, MD [facility] VIDEO VISIT RETURN       Triage: Triage completed, care advice given per protocol. Triage:Call back parameters given and caller advised of 24 hour nurse triage. Instructed to seek immediate medical attention if new symptoms develop, current symptoms worsen or if you become increasingly concerned. Patient verbalized understanding.     REVONDA COMER, RN [facility] Patient Engagement Center  Patient Engagement Center Documentation    Reason for Disposition . Red area or streak and large (> 2 in. or 5 cm)  Additional Information . Negative: Looks like a broken bone or dislocated joint (e.g., crooked or deformed) . Negative: Sounds like a life-threatening emergency to the triager . Negative: Followed a hip injury . Negative: Followed a knee injury . Negative: Followed an ankle or foot injury . Negative: Back pain radiating (shooting) into leg(s) . Negative: Foot pain is the main symptom . Negative: Ankle pain is the main symptom . Negative: Knee pain is the main symptom . Negative: Leg swelling is the main symptom . Negative: Chest pain . Negative: Difficulty breathing .  Negative: Entire foot is cool or blue in comparison to other side . Negative: Unable to walk . Negative: Fever and red area (or area very tender to touch) . Negative: Fever and swollen joint . Negative: Thigh or calf pain in only one leg and present > 1 hour . Negative: Thigh, calf, or ankle swelling in only one leg . Negative: Thigh, calf, or ankle swelling in both legs, but one side is definitely more swollen . Negative: History of prior 'blood clot' in leg or lungs (i.e., deep vein thrombosis, pulmonary embolism) . Negative: History of inherited increased risk of blood clots (e.g., factor 5 Leiden, antithrombin 3, protein C or protein S deficiency, prothrombin mutation) . Negative: Recent illness requiring prolonged bed rest (immobilization) . Negative: Hip or leg fracture in past 2 months (e.g., or had cast on leg or ankle) . Negative: Major surgery in the past two months . Negative: Cancer treatment in the past two months (or has cancer now) . Negative: Recent long-distance travel with prolonged time in car, bus, plane, or train (i.e., within past 2 weeks; 6 or more hours duration) . Negative: Patient sounds very sick or weak to the triager . Negative: SEVERE pain (e.g., excruciating, unable to do any normal activities) . Negative: Cast on leg or ankle and now has increasing pain  Protocols used: Leg Pain-A-OH
